# Patient Record
Sex: Male | Born: 1955 | Hispanic: Yes | Marital: Married | Smoking: Never smoker
Health system: Southern US, Community
[De-identification: ages and names within clinical notes are randomized; demographics above are authoritative.]

## PROBLEM LIST (undated history)

## (undated) DIAGNOSIS — G4730 Sleep apnea, unspecified: Secondary | ICD-10-CM

## (undated) DIAGNOSIS — E7800 Pure hypercholesterolemia, unspecified: Secondary | ICD-10-CM

## (undated) DIAGNOSIS — K219 Gastro-esophageal reflux disease without esophagitis: Secondary | ICD-10-CM

## (undated) DIAGNOSIS — I10 Essential (primary) hypertension: Secondary | ICD-10-CM

## (undated) DIAGNOSIS — I429 Cardiomyopathy, unspecified: Secondary | ICD-10-CM

## (undated) DIAGNOSIS — I2510 Atherosclerotic heart disease of native coronary artery without angina pectoris: Secondary | ICD-10-CM

## (undated) DIAGNOSIS — E785 Hyperlipidemia, unspecified: Secondary | ICD-10-CM

## (undated) DIAGNOSIS — M1990 Unspecified osteoarthritis, unspecified site: Secondary | ICD-10-CM

## (undated) DIAGNOSIS — I5022 Chronic systolic (congestive) heart failure: Secondary | ICD-10-CM

## (undated) DIAGNOSIS — T7840XA Allergy, unspecified, initial encounter: Secondary | ICD-10-CM

## (undated) DIAGNOSIS — D369 Benign neoplasm, unspecified site: Secondary | ICD-10-CM

## (undated) DIAGNOSIS — N529 Male erectile dysfunction, unspecified: Secondary | ICD-10-CM

## (undated) HISTORY — DX: Atherosclerotic heart disease of native coronary artery without angina pectoris: I25.10

## (undated) HISTORY — PX: CARDIAC CATHETERIZATION: SHX172

## (undated) HISTORY — DX: Male erectile dysfunction, unspecified: N52.9

## (undated) HISTORY — PX: SHOULDER SURGERY: SHX246

## (undated) HISTORY — DX: Benign neoplasm, unspecified site: D36.9

## (undated) HISTORY — DX: Allergy, unspecified, initial encounter: T78.40XA

## (undated) HISTORY — DX: Cardiomyopathy, unspecified: I42.9

## (undated) HISTORY — PX: KNEE ARTHROPLASTY: SHX992

## (undated) HISTORY — DX: Chronic systolic (congestive) heart failure: I50.22

## (undated) HISTORY — DX: Essential (primary) hypertension: I10

## (undated) HISTORY — PX: EYE SURGERY: SHX253

## (undated) HISTORY — DX: Hyperlipidemia, unspecified: E78.5

---

## 1997-11-15 ENCOUNTER — Other Ambulatory Visit: Admission: RE | Admit: 1997-11-15 | Discharge: 1997-11-15 | Payer: Self-pay | Admitting: Orthopedic Surgery

## 2000-08-09 ENCOUNTER — Ambulatory Visit (HOSPITAL_BASED_OUTPATIENT_CLINIC_OR_DEPARTMENT_OTHER): Admission: RE | Admit: 2000-08-09 | Discharge: 2000-08-09 | Payer: Self-pay | Admitting: Orthopedic Surgery

## 2002-06-17 ENCOUNTER — Encounter: Payer: Self-pay | Admitting: *Deleted

## 2002-06-17 ENCOUNTER — Inpatient Hospital Stay (HOSPITAL_COMMUNITY): Admission: EM | Admit: 2002-06-17 | Discharge: 2002-06-18 | Payer: Self-pay | Admitting: Emergency Medicine

## 2002-06-25 ENCOUNTER — Emergency Department (HOSPITAL_COMMUNITY): Admission: EM | Admit: 2002-06-25 | Discharge: 2002-06-25 | Payer: Self-pay | Admitting: Emergency Medicine

## 2004-06-15 ENCOUNTER — Inpatient Hospital Stay (HOSPITAL_BASED_OUTPATIENT_CLINIC_OR_DEPARTMENT_OTHER): Admission: RE | Admit: 2004-06-15 | Discharge: 2004-06-15 | Payer: Self-pay | Admitting: Interventional Cardiology

## 2004-06-15 ENCOUNTER — Inpatient Hospital Stay (HOSPITAL_COMMUNITY): Admission: AD | Admit: 2004-06-15 | Discharge: 2004-06-20 | Payer: Self-pay | Admitting: Interventional Cardiology

## 2004-07-03 ENCOUNTER — Encounter
Admission: RE | Admit: 2004-07-03 | Discharge: 2004-07-03 | Payer: Self-pay | Admitting: Thoracic Surgery (Cardiothoracic Vascular Surgery)

## 2004-09-13 ENCOUNTER — Encounter (HOSPITAL_COMMUNITY): Admission: RE | Admit: 2004-09-13 | Discharge: 2004-10-11 | Payer: Self-pay | Admitting: Interventional Cardiology

## 2006-04-16 HISTORY — PX: CORONARY ARTERY BYPASS GRAFT: SHX141

## 2006-11-20 ENCOUNTER — Encounter: Admission: RE | Admit: 2006-11-20 | Discharge: 2006-11-20 | Payer: Self-pay | Admitting: Interventional Cardiology

## 2006-11-26 ENCOUNTER — Inpatient Hospital Stay (HOSPITAL_BASED_OUTPATIENT_CLINIC_OR_DEPARTMENT_OTHER): Admission: RE | Admit: 2006-11-26 | Discharge: 2006-11-26 | Payer: Self-pay | Admitting: Interventional Cardiology

## 2006-12-02 ENCOUNTER — Ambulatory Visit (HOSPITAL_COMMUNITY): Admission: AD | Admit: 2006-12-02 | Discharge: 2006-12-03 | Payer: Self-pay | Admitting: Interventional Cardiology

## 2008-02-11 ENCOUNTER — Inpatient Hospital Stay (HOSPITAL_COMMUNITY): Admission: RE | Admit: 2008-02-11 | Discharge: 2008-02-13 | Payer: Self-pay | Admitting: Orthopedic Surgery

## 2008-08-28 ENCOUNTER — Emergency Department (HOSPITAL_BASED_OUTPATIENT_CLINIC_OR_DEPARTMENT_OTHER): Admission: EM | Admit: 2008-08-28 | Discharge: 2008-08-28 | Payer: Self-pay | Admitting: Emergency Medicine

## 2008-08-28 ENCOUNTER — Ambulatory Visit: Payer: Self-pay | Admitting: Diagnostic Radiology

## 2008-09-06 ENCOUNTER — Encounter: Admission: RE | Admit: 2008-09-06 | Discharge: 2008-09-06 | Payer: Self-pay | Admitting: Family Medicine

## 2010-08-29 NOTE — Cardiovascular Report (Signed)
NAMEANTWONE, CAPOZZOLI            ACCOUNT NO.:  [id number]   MEDICAL RECORD NO.:  [id number]          PATIENT TYPE:  OIB   LOCATION:  1963                         FACILITY:  [facility]   PHYSICIAN:  Lyn Records, M.D.   DATE OF BIRTH:  Jan 18, 1956   DATE OF PROCEDURE:  DATE OF DISCHARGE:  11/26/2006                            CARDIAC CATHETERIZATION   INDICATIONS FOR PROCEDURE:  Exertional chest discomfort in a patient who  is post-coronary artery bypass grafting in March 2006, at which time he  received a left internal mammary graft to the LAD, a free LIMA graft  sewn to the first obtuse marginal, off of the hood of a saphenous vein  graft to the second obtuse marginal.  He also had a saphenous vein graft  to the first diagonal.  For the past several weeks to a couple months,  he has had intermittent discomfort with variable threshold that causes  him to feel weak and funny in his chest.  He has not been taking  nitroglycerin for this.  The Cardiolite study recently demonstrated a  anterolateral wall defect and low-normal LV function.  There was not  significant re-distribution noted.   PROCEDURE PERFORMED:  1. Left heart cath.  2. Selective coronary angio.  3. Left ventriculography.  4. Bypass graft angiography.  5. Internal mammary artery graft angiography.   DESCRIPTION:  After informed consent the patient was brought to the  diagnostic outpatient laboratory, where he was sterilely prepped and  draped in the usual fashion.  She was given 2 mg of IV Versed.  Xylocaine 1% local infiltration was used.  A 4-French sheath was placed  in the right femoral artery.  Left heart catheterization was performed  with a 4-French A2 multipurpose catheter.  Left ventriculography by hand  injection, selective right native coronary angiography, and saphenous  vein graft angiography were performed with this catheter.  We then used  a #4 4-French left Judkins catheter for native left coronary  angiography.  We used an internal mammary artery catheter for a left  internal mammary graft angiography.  There was some initial confusion  about the site of the arterial free graft.  We were eventually able to  the learn from the operative report that the right internal mammary free  graft was sewn into the hood of the saphenous vein graft to the second  obtuse marginal.  Prior to understanding this, we did perform a flush  injection in the right subclavian artery and also an aortic root shot  looking for additional conduits that were not marked.  There were only  two aortic markers.   After reviewing the cineangiography and the prior coronary angiogram, as  well as the op note, the case was terminated and hemostasis achieved  with manual compression.   RESULTS:  1. Hemodynamic data:      a.     Aortic pressure 110/70.      b.     Left ventricular pressure 110/4.  2. Left ventriculography:  Low normal LV function, EF 50%.  3. Coronary angiography.      a.  Left main coronary:  Widely patent.      b.     The left anterior descending coronary:  Left anterior       descending coronary artery contains high-grade segmental 90 plus       percent stenosis before the first septal perforator and first       diagonal.  No competitive flow was noted in the diagonal.       Competitive flow was noted in the LAD beyond this region of       obstruction..      c.     Circumflex artery:  Circumflex gives origin to two small       obtuse marginal branches before it is totally occluded, after the       origin of the left atrial recurrent branch.      d.     Right coronary:  Right coronary is a large dominant vessel,       gives PDA and two left ventricular branches.  Minimal luminal       irregularities are noted.  4. Bypass graft angiography.      a.     Saphenous vein graft to the diagonal:  Totally occluded and       the aorto-ostial junction.      b.     Saphenous vein graft to the second  obtuse marginal:  This       graft is totally occluded at the site of the man-made bifurcation       with the right internal mammary graft to the first obtuse       marginal.      c.     The right internal mammary free graft sewn from the hood of       the saphenous vein graft to the obtuse marginal number 2 and       distally anastomosed to the first obtuse marginal:  This graft       conduit was widely patent.  5. Left internal mammary graft to the LAD:  This graft is widely      patent.  6. Aortography:  No evidence of aneurysm is noted.   CONCLUSIONS:  1. Low normal LV function.  2. Severe native vessel coronary disease with total occlusion of      distal circumflex, 90% stenosis in a small obtuse marginal branch      90% to 95% stenosis in the proximal LAD before the first septal      perforator and diagonal.  The LAD is protected by left internal      mammary graft to the LAD.  The saphenous vein graft to the diagonal      is occluded.  3. Bypass graft occlusive disease, with occlusion of saphenous vein      graft to the obtuse marginal #2, and occlusion of saphenous vein      graft to the first diagonal.  4. Patent LIMA to the LAD.  5. Patent free RIMA to the large obtuse marginal number 1.  6. Widely patent native right coronary.   RECOMMENDATIONS:  Consider PCI on the proximal LAD diagonal anastomosis,  to improve flow into this territory.  The second obtuse marginal  territory cannot be impacted by percutaneous intervention, because of  angulation and acute takeoff from the first obtuse marginal, making  retrograde work through the RIMA if possible, and total occlusion of the  native circulation making antegrade work down the native circulation  impossible.   PLAN:  Start Plavix.  Plan PCI on the LAD diagonal, when convenient for  the patient.      Lyn Records, M.D.  Electronically Signed     HWS/MEDQ  D:  11/26/2006  T:  11/27/2006  Job:  [id number]   cc:    Sigmund Hazel, M.D.  Salvatore Decent. Cornelius Moras, M.D.

## 2010-08-29 NOTE — Cardiovascular Report (Signed)
NAMESEAMUS, Scott Roth            ACCOUNT NO.:  [id number]   MEDICAL RECORD NO.:  [id number]          PATIENT TYPE:  OIB   LOCATION:  2908                         FACILITY:  [facility]   PHYSICIAN:  Lyn Records, M.D.   DATE OF BIRTH:  03-May-1955   DATE OF PROCEDURE:  12/02/2006  DATE OF DISCHARGE:                            CARDIAC CATHETERIZATION   PROCEDURE:  Cardiac catheterization and PCI.   INDICATIONS:  Recent chest discomfort, abnormal Cardiolite with fixed  anterolateral defect, and documentation of occluded saphenous vein graft  to the diagonal.  This diagonal territory was threatened because of high-  grade disease proximal to its origin and high-grade disease beyond its  origin from the LAD that obstructs retrograde flow from the patent LIMA  to the LAD.   PROCEDURE PERFORMED:  1. Drug-eluting stent implantation in the proximal LAD/first diagonal.  2. Intravascular ultrasound.   DESCRIPTION:  After informed consent, a 6-French sheath was placed in  the right femoral artery using the modified Seldinger technique.  A 6-  [place] XB 3.5 guide catheter was used for guide shot procurement.  The  patient received a bolus followed by an infusion of Angiomax.  He had  been previously started on Plavix and received an additional 300 mg  orally this morning.   An Clinical cytogeneticist guidewire was used across the stenosis.  We predilated  with a 2.5 x 12 Maverick.  We then deployed 16 x 2.75 Taxus and  postdilated the proximal two-thirds of the stent with a 12.0 x 3.25  Quantum to 14 atmospheres.  Deployment pressure for the stent was 10  atmospheres.  This distal stent margin was oversized for the diagonal  vessel.   After our post-dilatation with a 3.25 Quantum, we performed  intravascular ultrasound and documented good stent apposition and  expansion.   Following removal of the guidewire, we gave intracoronary nitroglycerin,  and after flushing the nitroglycerin, the patient  fibrillated due to R  on T during ST elevation from blebs placement during nitro  administration.  He was shocked back into normal sinus rhythm with 150  joules which initially failed and subsequently 200 joules.  The patient  awakened without any neurological sequelae.  Subsequent angiographic  runs demonstrated widely patent region of PCI.   Angio-Seal was used with good hemostasis.   RESULTS:  1. Hemodynamic data:  Aortic pressure 121/78.  2. PCI results:  LAD proximal to the first diagonal contains greater      than 90% stenosis.  Following PCI, as described above, 0% stenosis      was noted.  3. Ventricular fibrillation successfully treated with countershock.      It appears ventricular fibrillation was precipitated by saline      flush following intracoronary nitroglycerin administration at the      completion of the case.  4. Successful Angio-Seal.   IMPRESSION:  1. Successful percutaneous coronary intervention on the left anterior      descending reduces stenosis from 90% to 0% after drug-eluting Taxus      stent implantation.  2. Ventricular fibrillation treated with countershock without sequela.  This was felt to be a consequence of saline flush following      intracoronary nitroglycerin administration.   PLAN:  Discharge December 03, 2006.  Clinical followup as needed.      Lyn Records, M.D.  Electronically Signed     HWS/MEDQ  D:  12/02/2006  T:  12/02/2006  Job:  [id number]   cc:   Sigmund Hazel, M.D.

## 2010-08-29 NOTE — Op Note (Signed)
NAMENILSON, TABORA            ACCOUNT NO.:  [id number]   MEDICAL RECORD NO.:  [id number]          PATIENT TYPE:  INP   LOCATION:  5015                         FACILITY:  [facility]   PHYSICIAN:  Loreta Ave, M.D. DATE OF BIRTH:  1955/08/14   DATE OF PROCEDURE:  02/12/2008  DATE OF DISCHARGE:                               OPERATIVE REPORT   PREOPERATIVE DIAGNOSES:  Left knee, end-stage degenerative arthritis  with marked varus alignment and flexion contracture.  Bone loss, bone  insufficiency primarily proximal medial tibia.   POSTOPERATIVE DIAGNOSES:  Left knee, end-stage degenerative arthritis  with marked varus alignment and flexion contracture.  Bone loss, bone  insufficiency primarily proximal medial tibia.   PROCEDURE:  Left total knee replacement.  Modified minimally invasive  approach.  Stryker triathlon prosthesis.  Extensive soft tissue  balancing medial capsular and partial medial collateral ligament  release.  Reinforcement of insufficient medial tibial plateau with 2  inner fragmentary screws.  A 6.5 mm in the back and a 4.5 mm in the  front.  A cemented posterior stabilized femoral component size #4.  Cemented #5 tibial component with a 9-mm polyethylene insert.  This had  a 5-mm augmentation on the undersurface medially to make  up for bone  loss.  This was also stemmed with a 50-mm x 12-mm stem, cemented.  Resurfacing of patella with a 35-mm component.   SURGEON:  Loreta Ave, MD   ASSISTANT:  Genene Churn. Barry Dienes, [place], present throughout the entire case  necessary for timely completion of procedure.   ANESTHESIA:  General.   BLOOD LOSS:  Minimal.   TOURNIQUET TIME:  Two hours.   SPECIMENS:  None.   CONSULTS:  None.   COMPLICATIONS:  None.   DRESSING:  Soft compressive with knee immobilizer.   DRAIN:  Hemovac x1.   PROCEDURE:  The patient was brought to the operating room and placed on  the operating table in supine position.  After adequate  anesthesia had  been obtained, knee examined.  Almost 20 degrees of varus not very  correctable at all, about a 5-degree flexion contracture further flexion  about 80-85 degrees.  Tourniquet applied.  Prepped and draped in usual  sterile fashion.  Exsanguinated with elevation of Esmarch.  Tourniquet  inflated to 350 mmHg.  Straight incision above the patella down to the  tibial tubercle.  Medial arthrotomy vastus splitting at the top  preserving quad tendon.  Knee exposed.  Extensive medial capsule  release.  I actually released part of the MCL of the femoral and tibial  attachment maintaining medial stability, but enough soft tissue release  to get the knee corrected.  Distal femur exposed.  Intramedullary guide  placed.  Remnants of menisci, periarticular spurs, cruciate ligaments,  and loose bodies all removed.  A 10-mm distal cut on the femur.  Using  epicondylar axis, size cut and fitted for a posterior stabilized #4  component.  Although some bony deficiency medially,  I could go over the  standard component.  I then used an extramedullary 0-degree cut on the  tibia.  This was cut sequentially  till I got down to the level of the  fibular head laterally.  This still left the defect medially.  Utilizing  appropriate jigs, a 5-mm offset curve was made on the medial side, so I  can get below the defect there.  When this was complete, the medial  wall, which is markedly deficient was mobile near the top and I was very  concerned about a fracture extending if we did not stabilize that.  Putting the temporary trial in place in the tibia which had been precut  using the #5 component after rotation was set with trials with the  medial 5-mm buildup and with the stem, I then reinforced medial wall  with 2 interfragmentary screws.  A 6.5-mm lag screw behind the tibial  component and a 4.5-mm in front of the tibial component.  This gave good  solid reinforced on the medial wall.  Patella exposed.   A posterior 10  mm removed.  Size drilled, cut, and fitted for a 35-mm component.  When  that was all complete, trials were put back in place.  I was very  pleased with flexion, extension, alignment, stability and patellofemoral  tracking.  All trials were removed.  Copious irrigation with a pulse  irrigating device.  Before completion, we had deflated the tourniquet,  irrigated the knee once again and then reinflated it when we cemented  the components.  All components firmly cemented.  Polyethylene attached  to tibia and knee reduced.  At completion, I had nice alignment with a  normal mechanical axis.  Full extension, full flexion, good  patellofemoral tracking, and good stability.  The wound irrigated.  Arthrotomy closed with #1 Vicryl.  Skin and subcutaneous tissue with  Vicryl and staples.  Knee injected with Marcaine after Hemovac was  placed and then the Hemovac clamped.  Sterile compressive dressing  applied.  Tourniquet was inflated and removed.  Knee immobilizer  applied.  Anesthesia reversed.  Brought to recovery room.  Tolerated  surgery well.  No complications.      Loreta Ave, M.D.  Electronically Signed     DFM/MEDQ  D:  02/12/2008  T:  02/13/2008  Job:  [id number]

## 2010-09-01 NOTE — Op Note (Signed)
NAMEUPTON, RUSSEY            ACCOUNT NO.:  [id number]   MEDICAL RECORD NO.:  [id number]          PATIENT TYPE:  INP   LOCATION:  2307                         FACILITY:  [facility]   PHYSICIAN:  Burna Forts, M.D.DATE OF BIRTH:  04/21/1955   DATE OF PROCEDURE:  06/16/2004  DATE OF DISCHARGE:                                 OPERATIVE REPORT   PROCEDURE:  Intraoperative transesophageal echocardiogram.   ANESTHESIOLOGIST:  Burna Forts, M.D.   INDICATIONS FOR PROCEDURE:  Mr. Baldree is a patient of Dr. Erskine Emery Smith's  who presents today for coronary artery bypass grafting by Dr. Tressie Stalker.  We plan to place the TEE probe for evaluation of cardiac structures and  function as requested by Dr. Cornelius Moras.  The patient was brought to the holding  area where under local anesthesia and sedation the patient's radial artery  and coronary artery catheters were inserted.  The patient was taken to the  operating room for routine induction of general anesthesia.  The trachea was  intubated.   The TEE probe was then lubricated and passed oropharyngeally into the  stomach.  It was withdrawn slightly for imaging of the cardiac structures.   Pre-cardiopulmonary Bypass TEE Examination:  Left Ventricle:  The is an essentially normal left ventricular chamber size  with normal wall thickness in short axis views, and seen well.  There is  satisfactory overall contractility with some slight diminishment of overall  left ventricular function noted.  There are essentially no areas of  significant hypokinesis, and the short axis view and long axis view again  reveal essentially some mild anterior hypokinesis in the wall on just long  axis view.  The apical area was seen and satisfactory contractility was  noted in the apical ventricular chamber view.  Capillary muscles are well  outlined.  This is a normal chamber size as noted.   Mitral Valve:  This is a thin compliant mobile mitral valve  apparatus  opening appropriately and closing satisfactorily.  There is trace mitral  regurgitant flow noted on Doppler examination.   Left Atrium:  Normal left atrial chamber.  The anterior atrial septum is  interrogated and it is intact.   Aortic Valve:  The aortic valve is essentially normal.  Three cusps to the  aortic valve.  All cusps were mobile, compliant and functioning in the  normal manner.  There is trace aortic regurgitant flow noted in a  very  central area of the three cusps; again, this is trace.   Right Ventricle:  This is a somewhat enlarged right ventricular chamber,  very well trabeculated noted.  The overall chamber size appears to be just  slightly larger than normal; and, as we go through a progression of views  there does appear slightly increased size in the pulmonary outflow tract as  well as the pulmonary artery itself.   Tricuspid Valve:  Essentially normal tricuspid valve, again trace tricuspid  regurgitant flow noted on Doppler examination.   Right Atrium:  Normal right atrial chamber.   The patient was placed on cardiopulmonary bypass, hypothermia begun and  coronary artery bypass  grafting was carried out.  The patient was rewarmed  and separated from cardiopulmonary bypass with the initial attempt.   Post Cardiopulmonary Bypass Examination ((Limited Examination):  Left Ventricle:  In the early bypass period the left ventricular chamber  appeared just slightly diminished in its contractual pattern.  This was  associated also with an additional repair of a vein graft to the diagonal by  Dr. Cornelius Moras.  During that period of time there may have been some slight  diminishment and further increase in the hypokinetic area in the anterior to  anterolateral wall area.  This was noted in both short axis and long axis  views of the left ventricular chamber.  Overall chamber size remained about  the same.  Overall contractility was essentially no different from the   prebypass period.  With time from separation from cardiopulmonary bypass the  left ventricular contractility pattern did appear to improve prior to  returning to the cardiac intensive care unit.   Mitral Valve:  Four-chamber view of the mitral valve revealed perhaps an  increase in the mitral regurgitant flow to, you know, just mitral  regurgitation than trace, but certainly no more than 1+ mitral regurgitant  jet noted in the postbypass period.   The rest of the cardiac examination was as previously described.   The patient was returned to the cardiac intensive care unit in stable  condition.      JTM/MEDQ  D:  06/16/2004  T:  06/17/2004  Job:  [id number]   cc:   Anesthesiology Department

## 2010-09-01 NOTE — Discharge Summary (Signed)
Scott Roth, Scott Roth            ACCOUNT NO.:  [id number]   MEDICAL RECORD NO.:  [id number]          PATIENT TYPE:  INP   LOCATION:  2002                         FACILITY:  [facility]   PHYSICIAN:  Salvatore Decent. Cornelius Moras, M.D. DATE OF BIRTH:  1955/10/13   DATE OF ADMISSION:  06/15/2004  DATE OF DISCHARGE:                                 DISCHARGE SUMMARY   PRIMARY DIAGNOSIS:  Coronary artery disease.   SECONDARY DIAGNOSES:  1.  Hypertension.  2.  Hyperlipidemia.  3.  Gastroesophageal reflux disease.   OPERATION/PROCEDURE:  In hospital operations and/or procedures:  1.  Cardiac catheterization.  2.  Coronary artery bypass grafting times four with the left internal      mammary artery to the left anterior descending, a free right internal      mammary artery to the first branch of the obtuse marginal, saphenous      vein graft to the first branch of the diagonal and a saphenous vein      graft to the second branch obtuse marginal.  3.  Right endovascular vein harvesting was used.   ALLERGIES:  The patient is allergic to PENICILLIN and DYE.   HISTORY, PHYSICAL AND HOSPITAL COURSE:  Scott Roth is a 55 year old male  from [place], [place]  with a known history of coronary artery  disease.  The patient also has a past medical history of hypertension,  hyperlipidemia and gastroesophageal reflux disease.  The patient originally  was evaluated by Dr. Katrinka Blazing in March 2004.  At that time he had some mild  symptoms of chest pain and he underwent  stress test and cardiac  catheterization.  He was found to have two-vessel coronary artery disease,  which at that time was not high grade.  The patient was treated medically  and had done well until recently.   Scott Roth now describes a two-month history of progressive symptoms as  classical angina.  He describes squeezing substernal chest pain that  radiates across his left chest and up to the neck and his arm.  This, for  the most  part, has appeared with physical activity and is also relieved with  rest.  These symptoms have increased in frequency and severity in recent  weeks; and, recently he also had an episode of chest pain and shortness of  breath that awoke him from sleep at night.  He returned to Dr. Katrinka Blazing and  subsequently was scheduled for cardiac catheterization that was performed on  June 15, 2004.  Catheterization demonstrated progression of severe two-  vessel coronary artery disease with coronary anatomy relatively unfavorable  for percutaneous coronary intervention.  Left ventricular function was  preserved.  We were then consulted at that time.   The patient's hospital course:  On June 16, 2004 Scott Roth underwent  coronary artery bypass grafting times four with a left internal mammary  artery to the left anterior descending, a free right internal mammary artery  to the first branch of the obtuse marginal, saphenous vein graft to the  first branch of the diagonal and saphenous vein graft to the second branch  of  the obtuse marginal.  Of note, right endovascular harvesting was used.  The patient tolerated this procedure well and was transferred up to the  intensive care unit in stable condition.   The patient's postoperative course was pretty much unremarkable.  He was  extubated the evening of surgery.  Chest tubes were DC 'd on postoperative  day one.  The patient was transferred out to the telemetry floor on  postoperative day two.  The patient did have some postoperative edema of the  bilateral lower extremities and he was placed on diuretics, which will be  continued at discharged.  During his hospital stay he was able to come off  the nasal cannula oxygen and on room air his O2 sats at discharge were  ranging from 94-96%.  The patient was ambulating well.  Appetite was  improving and he had a positive bowel movement postoperatively.   At discharge his incisions seemed to be dry, intact  and healing well.   On postoperative day three pacing wires were DC 'd; and, as well he was his  on cue for pain medication.  The patient was discharged to home on  postoperative day number four in stable condition.   FOLLOW UP:  A follow up appointment is to be made with Dr. Cornelius Moras in three  weeks as well as with Dr. Katrinka Blazing in two weeks.  The patient will need to  obtain a PA and lateral chest x-ray at [facility] one hour prior to  his appointment with Dr. Cornelius Moras.   DISCHARGE INSTRUCTIONS:  Scott Roth received instructions on diet,  activity level and incisional care.  He was told he was restricted from  driving and was told at least to do so.  No heavy lifting over 10 pounds and  he is to wash his incisions with soap and water.  He was also told to  contact the office if he develops any drainage opening from any of his  incisions.  The patient, again, acknowledges understanding.  He is to  continue ambulating as tolerated and is to use his incentive spirometer  daily.   DISCHARGE MEDICATIONS:  1.  Aspirin 325 mg p.o. daily.  2.  Toprol XL 50 mg p.o. daily.  3.  Lipitor 20 mg p.o. q.h.s.  4.  Lasix 40 mg p.o. daily times 14 days.  5.  Potassium chloride 20 mEq p.o. daily times 14 days.  6.  Tylox 1 tab p.o. q.4 hours p.r.n. pain.  7.  Currently we will hold starting an ACE due to control of the patient's      blood pressure and heart rate.      KMD/MEDQ  D:  06/19/2004  T:  06/20/2004  Job:  [id number]

## 2010-09-01 NOTE — Cardiovascular Report (Signed)
NAMEVALLEN, CALABRESE            ACCOUNT NO.:  [id number]   MEDICAL RECORD NO.:  [id number]          PATIENT TYPE:  OIB   LOCATION:  6501                         FACILITY:  [facility]   PHYSICIAN:  Lyn Records III, M.D.DATE OF BIRTH:  Aug 06, 1955   DATE OF PROCEDURE:  06/15/2004  DATE OF DISCHARGE:                              CARDIAC CATHETERIZATION   INDICATIONS:  The patient is 38 and has a history of moderate coronary  disease documented by coronary angiography in March of 2004. Within the past  six weeks, he has developed predictable exertional angina which is a change  in symptomatology. The study is being done to redefine coronary anatomy and  help guide therapy.   PROCEDURE PERFORMED:  1.  Left heart cath.  2.  Selective coronary angio.  3.  Left ventriculography.   DESCRIPTION:  After informed consent, a 4-French sheath was placed in the  right femoral artery using the modified Seldinger technique. A 4-French A2  multipurpose catheter was used for hemodynamic recordings, left  ventriculography by hand injection, and selective right coronary. The left  coronary was selectively engaged with a #4, 4-French left Judkins catheter.  The patient tolerated the procedure without complications. 200 mcg of  intracoronary nitroglycerin was given into the right coronary during the  right coronary angiography.   RESULTS:  A.  Left main coronary:  Widely patent. It branches into the LAD  and circumflex.  B.  Left anterior descending coronary:  The left anterior descending is not  transapical. It supplies the anterior wall. It gives origin to the two  diagonals. The first diagonal contains 90% mid narrowing. The diagonal is  relatively small. The LAD contains segmental 80-90% stenosis and also  involves the origin of a large branching second diagonal. The second  diagonal has ostial 70-80% narrowing. The vessel is otherwise free of  obstruction. The diagonal is bifurcating.  C.   Circumflex coronary:  The circumflex coronary artery is a large vessel  that gives origin to four obtuse marginal branches. The first obtuse  marginal was tiny, the second obtuse marginal is slightly larger but also  small and not graftable. The third obtuse marginal is a large vessel that  supplies the lateral wall and apex. The fourth obtuse marginal supplies the  inferolateral wall and was also large. The patient has a severe 99% mid  circumflex stenosis that involves the origin of all three of the more distal  obtuse marginal branches, 2-4.  The stenosis is subtotal 99% with TIMI grade  2 to TIMI grade 3 flow.  D.  Right coronary:  The right coronary is a large dominant vessel with only  minimal plaquing. A large dominant PDA that is transapical arises as well as  two left ventricular branches.   CONCLUSION:  1.  The patient has severe complex left anterior descending and circumflex      disease involving multiple branches with the stenoses as outlined above.      The right coronary is dominant and free of any significant obstruction.  2.  Normal left ventricular function.   RECOMMENDATIONS:  Despite the  patient's young age given the anatomy, I feel  that surgical revascularization is probably the most prudent approach.  Percutaneous intervention is possible but there is a risk of side branch  occlusion and myocardial injury. If the patient decides against surgery, I  would  certainly be happy to attempt to  perform PCI initially on the circumflex and subsequently on the left  anterior descending probably as  separate sitting/stage. I will admit the  patient to the hospital, start heparin and Integrilin and have CVTS see the  patient and hopefully have surgery done with the next 24 hours.      HWS/MEDQ  D:  06/15/2004  T:  06/15/2004  Job:  [id number]   cc:   Seymour Bars, D.O.  [facility].  [facility]. Resident  [place], [place] [address]  Fax: [phone]   [name]   [facility]

## 2010-09-01 NOTE — Op Note (Signed)
Scott Roth, Scott Roth            ACCOUNT NO.:  [id number]   MEDICAL RECORD NO.:  [id number]          PATIENT TYPE:  INP   LOCATION:  2307                         FACILITY:  [facility]   PHYSICIAN:  Salvatore Decent. Cornelius Moras, M.D. DATE OF BIRTH:  12-Aug-1955   DATE OF PROCEDURE:  06/16/2004  DATE OF DISCHARGE:                                 OPERATIVE REPORT   PREOPERATIVE DIAGNOSIS:  Severe two-vessel coronary artery disease with  class II angina.   POSTOPERATIVE DIAGNOSIS:  Severe two-vessel coronary artery disease with  class II angina.   PROCEDURE:  Median sternotomy for coronary artery bypass grafting x4 (left  internal mammary artery to distal left anterior descending coronary artery,  free right internal mammary artery to first circumflex marginal branch,  saphenous vein graft to first diagonal branch, saphenous vein graft to  second circumflex marginal branch, endoscopic saphenous vein harvest from  right thigh).   SURGEON:  Salvatore Decent. Cornelius Moras, M.D.   ASSISTANT:  Ms. Zadie Rhine.   ANESTHESIA:  General.   BRIEF CLINICAL NOTE:  The patient is a 55 year old male with known history  of coronary artery disease, hypertension, hyperlipidemia, and GE reflux  disease.  The patient now presents with recent onset of exertional angina.  The patient underwent cardiac catheterization by Dr. Katrinka Blazing, demonstrating  severe two-vessel coronary artery disease with coronary anatomy relatively  unfavorable for percutaneous coronary intervention.  A full consultation  note has been dictated previously.   OPERATIVE CONSENT:  The patient has been counseled at length regarding the  indications, risks, and potential benefits of coronary artery bypass  grafting.  He understands and accepts all associated risks of surgery and  desires to proceed as described.   OPERATIVE NOTE IN DETAIL:  The patient is brought to the operating room on  the above-mentioned date and central monitoring is established by  the  anesthesia service under the care and direction of Dr. Sharee Holster.  Specifically, a Swan-Ganz catheter is placed through the right internal  jugular approach.  A radial arterial line is placed.  Intravenous  antibiotics are administered.  Following induction with general endotracheal  anesthesia, a Foley catheter is placed.  The patient's chest, abdomen, both  groins, and both lower extremities are prepared and draped in sterile  manner.   A median sternotomy incision is performed and the left internal mammary  artery is dissected from the chest wall and prepared for bypass grafting.  The left internal mammary artery is a good-quality conduit.  Following this,  the right internal mammary artery is also dissected from the chest wall and  prepared for bypass grafting.  This vessel is also a good-quality conduit.  Simultaneously, a saphenous vein is obtained from the patient's right thigh  using endoscopic vein harvest technique.  The saphenous vein is a good-  quality conduit.  The patient is heparinized systemically.  After the  saphenous vein is removed, the small surgical incision in the right thigh is  closed in multiple layers with running absorbable suture.   The pericardium is opened.  The ascending aorta is normal in appearance.  The ascending aorta  and the right atrium are cannulated for cardiopulmonary  bypass.  Adequate heparinization is verified.  Cardiopulmonary bypass is  begun and the surface of the heart is inspected. Overall, the heart is  somewhat enlarged with generalized biventricular enlargement.  However, left  ventricular function appears normal.  Distal sites are selected for coronary  bypass grafting.  Of note, the right internal mammary artery is not long  enough to reach to any of the distal targets as an in situ graft.  Subsequently, the right internal mammary artery is transected just beyond  its takeoff from the right subclavian artery and the  proximal stump is  oversewn with suture ligature.  A cardioplegia catheter is placed in the  ascending aorta.  A temperature probe is placed in the left ventricular  septum.   The patient's temperature is allowed to cool passively to 32 degrees  systemic temperature.  The aortic crossclamp is applied and cardioplegia is  delivered initially in an antegrade fashion through the aortic root.  Iced  saline slush is applied for topical hypothermia.  The initial cardioplegic  arrest and myocardial cooling are felt to be excellent.  Repeat doses of  cardioplegia are administered intermittently throughout the crossclamp  portion of the operation, both through the aortic root and down the  subsequently placed vein grafts to maintain septal temperature to below 15  degrees centigrade.   The following distal coronary anastomoses are performed:  1.  The first circumflex marginal branch is grafted with the right internal      mammary artery as a free graft in an end-to-side fashion.  This coronary      measures 1.8 mm in diameter at the site of distal bypass and is of good      quality.  2.  The second circumflex marginal branch is grafted with a saphenous vein      graft in an end-to-side fashion.  This coronary measures 1.5 mm in      diameter and is of good quality.  3.  The diagonal branch off the left anterior descending coronary artery is      grafted with a saphenous vein graft in an end-to-side fashion.  This      diagonal branch is actually at a bifurcating system and the medial sub-      branch is grafted.  It is the larger and more dominant of the to sub-      branches.  At the site of distal bypass, it measures 1.5 mm in diameter      and is of good quality.  4.  The left anterior descending coronary artery is grafted with left      internal mammary artery in an end-to-side fashion.  This coronary artery      is intramyocardial.  At the site of distal bypass, it measures 2 mm in      diameter and is of good quality.   Both proximal saphenous vein anastomoses are performed directly to the  ascending aorta prior to removal of the aortic crossclamp.  The left  ventricular septal temperature rises rapidly with reperfusion of the left  internal mammary artery.  The aortic crossclamp is removed after a total  crossclamp time of 67 minutes.   The heart begins to beat spontaneously without need for cardioversion.  All  proximal and distal coronary anastomoses were inspected for hemostasis and  appropriate graft orientation.  The proximal end of the right internal  mammary artery graft is sewn  in a side-to-end fashion onto the hood of the  vein graft to the second circumflex marginal branch.  The patient is  rewarmed to 37 degrees centigrade temperature.  The patient weaned from  cardiopulmonary bypass without difficulty.  The patient's rhythm at  separation from bypass is normal sinus rhythm.  No inotropic support is  required.  Total cardiopulmonary bypass time for the operation 101 minutes.   After separation from bypass, it appears that the vein graft to the diagonal  branch appears to be slightly short and under a little bit of tension.  For  this reason, a short interposition of saphenous vein is sewn end-to-end  within the vein conduit to lengthen this graft slightly.  After this has  been completed, all grafts are again reinspected and appear to be  functioning appropriately.  The venous and arterial cannulae are removed  uneventfully.  Protamine is administered to reverse the anticoagulation.  The mediastinum and both pleural spaces are irrigated with saline solution  containing vancomycin.  Meticulous surgical hemostasis is ascertained.   The mediastinum and both pleural spaces were drained with 4 chest tubes  exited through separate stab incisions inferiorly.  On-Q catheters are  placed in the deep subcutaneous tissues immediately anterior to the costal   cartilages on either side of the sternal border for continuous infusion of  bupivacaine for postoperative analgesia management.  Each of these catheters  are bolused with 5 mL of 0.5% plain bupivacaine solution and subsequently  connected to the continuous-infusion pump provided by the manufacturer.  The  median sternotomy is then closed with double-strength sternal wire.  The  soft tissues anterior to the sternum are closed in multiple layers and the  skin is closed with a running subcuticular skin closure.   The patient tolerated the procedure well and is transported to the surgical  intensive care unit in stable condition.  There are no intraoperative  complications.  All sponge, instrument and needle counts are verified  correct at completion of the operation.  No blood products were  administered.      CHO/MEDQ  D:  06/16/2004  T:  06/17/2004  Job:  [id number]   cc:   Lesleigh Noe, M.D.  [address]  [address]  [place] [address]  Fax: [phone]   Adrian Saran, [place].P.

## 2010-09-01 NOTE — Discharge Summary (Signed)
NAME:  Scott Roth, Scott Roth                      ACCOUNT NO.:  [id number]   MEDICAL RECORD NO.:  [id number]                   PATIENT TYPE:  INP   LOCATION:  3714                                 FACILITY:  [facility]   PHYSICIAN:  Lyn Records, M.D.                DATE OF BIRTH:  October 24, 1955   DATE OF ADMISSION:  06/17/2002  DATE OF DISCHARGE:  06/18/2002                                 DISCHARGE SUMMARY   PROCEDURES:  1. June 18, 2002, diagnostic cardiac catheterization, Dr. Verdis Prime.   DISCHARGE DIAGNOSES:  1. Symptoms of unstable angina; question acute coronary syndrome:     a. June 18, 2002, diagnostic cardiac catheterization revealing moderate        left anterior descending, circumflex disease with 65 to 75% left        anterior descending at bifurcation of diagonal 1.  Diagonal with 70%        stenosis; 60 to 75% distal circumflex beyond obtuse marginal #2.     b. Normal left ventricular function, ejection fraction of approximately        60%.     c. Negative serial CK-MB with slightly elevated troponin-I of 0.15 and        0.14.     d. Medical therapy of moderate left anterior descending and circumflex        disease with beta blocker, antiplatelet therapy and PRN sublingual        nitroglycerin.  Outpatient stress Cardiolite to establish baseline.  2. Hypertension;  blood pressure ranging from 150's to 160's/90's to 100's.     On hydrochlorothiazide 12.5 mg p.o. daily, beta blocker, low dose, added     to regimen.  3. History of dyslipidemia; lipid profile pending at time of this discharge.     He will continue on his Pravachol dose as prior to admission (? 40 mg per     day).   PLAN:  1. Discharge home with condition stable, improved.  2. Follow up outpatient Cardiolite on July 01, 2002 to establish baseline.   MEDICATIONS:  1. (New) nitroglycerin tablet 0.4 mg sublingual PRN chest pain, up to three     tablets in 15 minutes.  Patient is told to lay down when taking  this     medication.  2. Pravachol, dose as before (? 40 mg) p.o. q.h.s.  3. (Increased) enteric coated aspirin 325 mg once daily.  4. (New) Toprol XL 25 mg p.o. daily.  5. Hydrochlorothiazide one-half of a 25 mg tablet p.o. daily, eat a banana     and/or drink a glass of orange juice every day when taking this     medication as it can lower potassium level.   ACTIVITY:  No heavy lifting or pushing or driving the day of discharge and  day post discharge then activity as before.   DIET:  Low fat, low cholesterol, no added salt.  WOUND CARE:  The patient is to call our office if he notices any swelling,  bruising in groin area.   FOLLOW UP:  Follow up outpatient stress Cardiolite Wednesday, July 01, 2002  at 10 AM.  Patient is instructed to be NPO the evening before test; hold  Toprol the day before and day of test.  No caffeine day prior to and day of  test.   HISTORY OF PRESENT ILLNESS:  The patient is a 55 year old male (family from  [place]) without prior cardiac history.  He was diagnosed with elevated blood  pressure approximately six to eight months earlier.  He felt fine so he did  not take his medications.  The patient had been complaining of increasing  episodes of headaches.  His blood pressure revealed systolic 140's/diastolic  110's.  He was started on hydrochlorothiazide and took his first dose  Monday, June 15, 2002 with subsequently feeling dizzy, weak and washed out.  No syncope.  He had associated right arm numbness and a sinking sensation  in left chest.  No shortness of breath, nausea nor palpitations.  No  exertional chest pain recently.  No recurrent symptoms.  He went to see  Nurse Practitioner, [name].  She noted electrocardiogram changes from  previous with nonspecific ST wave changes I, aVL, II, III,  aVF and V5  through V6.  The patient was seen by Dr. Katrinka Blazing at [facility] emergency room.  Serial CK-MB's were negative but first  troponin-I  elevated at 0.15, second troponin-I was 0.14.  The patient was counseled to  undergo cardiac catheterization which he accepted.  Results as above.  His  cardiac catheterization revealed essentially moderate left anterior  descending and circumflex disease.  Plans for medical therapy with follow up  outpatient stress Cardiolite to establish baseline.  He was discharged on  low dose Toprol, continuation of hydrochlorothiazide, continuation of  Pravachol, aspirin, and PRN sublingual nitrates.   LABORATORY DATA:  White blood cell count 7.1, hemoglobin 16.9, hematocrit  47.6, platelet count 309,000.  Differential within normal range essentially.  ProTime 1.9, INR 0.8, PTT 33. Sodium 137, potassium 3.9, chloride 103, cO2  27, glucose 90, BUN 16, creatinine 0.9.  Liver function tests all within  normal range.  First CK 121, MB fraction 1.5, troponin-I 0.15, second CK 95,  MB fraction 1.5, third CK 85, MB fraction 1, troponin-I 0.14.  Chest x-ray  revealed probable tiny granuloma within the right upper lobe.  No active  chest disease.  Electrocardiogram revealed NSR with nonspecific T wave  changes, inferior lateral leads.  This is changed compared to EKG August 06, 2000 at which time lateral T wave changes were really not present.     Salomon Fick, N.P.                       Lyn Records, M.D.    MES/MEDQ  D:  06/18/2002  T:  06/19/2002  Job:  [id number]

## 2010-09-01 NOTE — H&P (Signed)
NAME:  Scott Roth, Scott Roth                      ACCOUNT NO.:  [id number]   MEDICAL RECORD NO.:  [id number]                   PATIENT TYPE:  INP   LOCATION:  3714                                 FACILITY:  [facility]   PHYSICIAN:  Lyn Records, M.D.                DATE OF BIRTH:  [birth year]   DATE OF ADMISSION:  06/17/2002  DATE OF DISCHARGE:                                HISTORY & PHYSICAL   ADMISSION DIAGNOSES:  1. Abnormal electrocardiogram.  2. Acute coronary syndrome (?).  3. Hypertension.  4. Accelerated.  5. Hyperlipidemia.   CHIEF COMPLAINT:  Abnormal electrocardiogram.   HISTORY OF PRESENT ILLNESS:  This is a 55 year old male (family from [place])  without prior cardiac history.  Diagnosed with elevated blood pressure  approximately six to eight months ago.  He felt fine, so he did not take his  medicines.  Recently he had been complaining of increasing episodes of  headaches.  The blood pressure check revealed systolic in the 140s and  diastolic in the 110s.  Hydrochlorothiazide was recommended.  He took his  first dose (25 mg) on Monday, June 15, 2002.  He felt dizzy, weak, and  washed out.  No syncope.  He had associated right arm numbness and a  sinking sensation in the left chest.  No shortness of breath, nausea, or  palpitations.  No exertional chest pain recently.  No recurrent symptoms.  He went to see his nurse practitioner, [name], N.P., today.  The EKG  was changed from prior.  This showed nonspecific ST-T wave changes in 1,  aVL, 2, 3, aVF, and V5-6.  Based on recent symptoms, risk factors, and  abnormal EKG, the patient was sent to the ER for further evaluation by Lyn Records, M.D.   ALLERGIES:  PENICILLIN causing rash.   MEDICATIONS:  1. Hydrochlorothiazide 25 mg one-half tablet a day.  2. Pravachol, dose unknown.  3. Aspirin 81 mg.  4. Garlic tablet.   PAST MEDICAL HISTORY:  1. Hypertension.  2. Hyperlipidemia (profile in September of 2003  showing total cholesterol of     221, triglycerides 213, HDL 46, and LDL 158).  3. Left knee arthroscopy in 2002.  4. GERD.   SOCIAL HISTORY:  The patient is separated.  Father of two.  He was an  Market researcher.  He is currently in sales and travels frequently.  No regular exercise currently.  Drinks maybe two beers a week.  No alcohol.  No illicit drug use.   FAMILY HISTORY:  Father alive at age 28.  He had bypass at age 57.  Mother  age 48 with hypertension.  Five siblings, two with hypertension.  No  premature family history of heart disease.   REVIEW OF SYSTEMS:  Increased headaches recently with elevated blood  pressure.  No fever, chills, cough, or cold symptoms.  No congestion.  He  does have  indigestion after eating acidic foods.  No exertional indigestion.  No peptic ulcer disease or melena.  No claudication or lower extremity  edema.   PHYSICAL EXAMINATION:  VITAL SIGNS:  The blood pressure is 164/104, pulse  64, respirations 20, and SAO2 94% on room air.  GENERAL APPEARANCE:  The patient is alert and oriented x 3 and in no acute  distress.  He is very fit.  HEENT:  Normocephalic and atraumatic.  NECK:  Supple without bruits or masses.  LUNGS:  Clear to auscultation.  No wheezes.  CORONARY:  Regular rate and rhythm without murmur, rub, or gallop.  ABDOMEN:  Soft, nontender, and nondistended.  Normoactive bowel sounds in  four quadrants.  EXTREMITIES:  2+ arm pulses bilaterally without bruits.  2+ distal pulses  without edema.  NEUROLOGIC:  Nonfocal.  Mentation intact.   LABORATORY DATA:  The EKG shows normal sinus rhythm with nonspecific T-wave  changes in the inferolateral leads.  This is changed from EKG on August 06, 2000, at which time the lateral T-wave changes were really not present.  The  chest x-ray results are pending.   INR 0.8.  Hemoglobin 16.9, platelets 309, WBC 7.1.  Potassium 3.9, BUN 16,  creatinine 0.9, glucose 90.  CK 121, MB 1.5,  troponin I 0.15.   IMPRESSION:  1. Acute coronary artery syndrome (questionable) with abnormal     electrocardiogram.  2. Hypertension, accelerated.  3. Hyperlipidemia.   PLAN:  1. Will admit.  2. Lovenox.  3. Sublingual nitroglycerin p.r.n.  4. Beta blocker therapy.  5. Aspirin.  6. Heart catheterization on June 18, 2002 by Lyn Records, M.D.   The risks and benefits of the procedure have been reviewed with the patient.  Lyn Records, M.D., will see the patient this afternoon.     Georgiann Cocker Jernejcic, P.A.                   Lyn Records, M.D.    TCJ/MEDQ  D:  06/17/2002  T:  06/17/2002  Job:  [id number]

## 2010-09-01 NOTE — Op Note (Signed)
[place]  Patient:    Scott Roth, Scott Roth                   MRN: [id number] Proc. Date: 08/09/00 Adm. Date:  [id number] Attending:  Milly Jakob                           Operative Report  PREOPERATIVE DIAGNOSIS:  Medial meniscal tear.  POSTOPERATIVE DIAGNOSES: 1. Medial meniscal tear. 2. Chondromalacia medial femoral condyle. 3. Chondromalacia with osteochondral defect patella.  PRINCIPLE PROCEDURE: 1. Partial posterior horn medial meniscectomy with debridement of medial    femoral condyle. 2. Debridement of patellofemoral osteochondral defect.  SURGEON:  Harvie Junior, M.D.  ASSISTANT:  Currie Paris. Thedore Mins.  ANESTHESIA:  Block.  BRIEF HISTORY:  A 55 year old male with a long history of having had severe knee problems on the left side.  He ultimately most recently began having catching and locking on the right side and when evaluated in the office after a couple of months of pain.  He was noted at that time to have a palpable clunk on the back side of his knee and we had a long talk about treatment options and ultimately he felt he wanted to come to the operating room for evaluation of anesthesia arthroscopy and he is brought to the operating room for that procedure.  PROCEDURE:  The patient was brought to the operating room and after general anesthesia was obtained with general anesthetic the patient was placed on the operating room table and examined under anesthesia and was noted to be stable in all directions.  At that point the patient was prepped and draped in the usual sterile fashion and following this routine arthroscopic examination of the knee revealed there was an obvious posterior horn medial meniscal tear. This was debrided with a combination of straight biting forceps and right-hand side biting forceps, and upbiting forceps.  The remaining meniscal remnants were contoured down with a suction shaver.  The medial  femoral condyle was identified and was noted to have some significant areas of chondromalacia which were debrided back to a smooth and stable rim.  Attention was then turned to the anterior cruciate which was normal, attention was turned laterally where there was no evidence of abnormality.  Attention was turned back up to the patellofemoral joint where the tracking was noted to be midline but there was a fairly significant osteochondral defect on the posterior aspect of the patella.  This was debrided with a suction shaver down to a stable rim.  The knee was then put through a range of motion, and the trochlea did not appear to have significant chondromalacia. At this point the knee was copiously irrigated and suctioned dry.  A final check had been made prior to this to make sure there was no lose fragment or pieces on the medial side.  In fact, there was none.  At this point the sterile compressive dressing was applied along with a dressing.  The patient was taken to the recovery room and found to be in satisfactory condition.  ESTIMATED BLOOD LOSS DURING THE PROCEDURE:  None. DD:  08/09/00 TD:  08/10/00 Job: [id number]

## 2010-09-01 NOTE — H&P (Signed)
Scott Roth            ACCOUNT NO.:  [id number]   MEDICAL RECORD NO.:  [id number]          PATIENT TYPE:  INP   LOCATION:                               FACILITY:  [facility]   PHYSICIAN:  Salvatore Decent. Cornelius Moras, M.D. DATE OF BIRTH:  1956-03-08   DATE OF ADMISSION:  06/15/2004  DATE OF DISCHARGE:  06/20/2004                                HISTORY & PHYSICAL   CHIEF COMPLAINT:  Evaluation for coronary artery bypass grafting.   HISTORY OF PRESENT ILLNESS:  [name] is a 55 year old male from  [place], [place] with a remote history of coronary artery disease.  The patient also has a past medical history of hypertension, hyperlipidemia,  and gastroesophageal reflux disease.  The patient originally was evaluated  by Dr. Katrinka Blazing in March 2004.  At that time he had some mild symptoms of chest  pain and he underwent a stress test and cardiac catheterization.  He was  found to have two-vessel coronary artery disease which at that time was not  high grade.  The patient was treated medically, had done well until  recently.  [name] now describes a two-month history of progressive  symptoms as classical angina.  He describes squeezing substernal chest pain  that radiates across his left chest and up to the neck and his arm.  This  for the most part has been associated with physical activity and relieved  with rest.  His symptoms have been increasing in frequency and severity in  recent weeks and recently he has had an episode of chest pain and shortness  of breath that awoke him from sleep at night.  He returned to Dr. Katrinka Blazing and  subsequently was scheduled for cardiac catheterization that was performed on  June 15, 2004.  Catheterization demonstrated progression of his severe two-  vessel coronary artery disease with coronary anatomy relatively unfavorable  for a percutaneous coronary intervention.  Left ventricular function was  preserved.   PAST MEDICAL HISTORY:  1.   Hypertension.  2.  Hyperlipidemia.  3.  Gastroesophageal reflux disease.  4.  Left knee arthroscopy in 2002.   SOCIAL HISTORY:  Denies any alcohol, tobacco, or illicit drug use.  He is  currently separated, father of two.   FAMILY HISTORY:  Father positive for coronary artery disease.  Mother  positive for hypertension.  Siblings:  A total of five, two positive for  hypertension.   ALLERGIES:  1.  PENICILLIN.  2.  DYE.   REVIEW OF SYSTEMS:  GENERAL:  Denies any recent changes in weight, fever,  night sweats, or chills.  HEENT:  Denies any history of migraines, recent  changes in vision or hearing, difficulty swallowing.  RESPIRATORY:  Denies  any shortness of breath, cough, hemoptysis, or wheezing.  CARDIOVASCULAR:  See HPI.  GASTROINTESTINAL:  Denies any nausea, vomiting, abdominal pain,  change in bowel movements, constipation, melena, hematochezia.  GENITOURINARY:  Denies any urgency, frequency, dysuria, hematuria.  MUSCULOSKELETAL:  Denies joint pain.  VASCULAR:  Denies any easy bleeding or  bruising.  NEUROLOGIC:  Denies any history of cerebrovascular event,  transient ischemic attack,  numbness, weakness.   PHYSICAL EXAMINATION:  GENERAL:  Well-developed, well-nourished, white male  in no acute distress.  VITAL SIGNS:  Temperature of 97.4, pulse 65, respiratory rate 20, blood  pressure 131/76.  Preoperative weight of 196 pounds.  HEENT:  Normocephalic, atraumatic.  Pupils are equal, round, and reactive to  light and accommodation.  Tympanic membranes intact.  Oral mucosa is moist  and pink.  RESPIRATORY:  Clear to auscultation bilaterally.  CARDIAC:  Regular rate and rhythm.  S1 and S2 noted.  No murmurs, gallops,  or rubs noted.  ABDOMEN:  Bowel sounds x 4.  Soft, nontender on palpation.  No  hepatosplenomegaly noted.  GENITALIA:  Deferred.  RECTAL:  Deferred.  EXTREMITIES:  No edema, cyanosis, or clubbing noted.  The patient has 2+  bilateral radial, femoral, and  dorsalis pedal pulses noted.  NEUROLOGIC:  Cranial nerves II-XII intact.  The patient is alert and  oriented x 4.  Muscle strength 5/5 noted bilateral upper and lower  extremities.   IMPRESSION:  [name] is a 55 year old male seen with severe two-  vessel coronary artery disease.  The patient was seen and evaluated by Dr.  Cornelius Moras.   PLAN:  Dr. Cornelius Moras discussed with [name] undergoing coronary artery  bypass grafting.  He discussed the risks and benefits of this procedure  including death, myocardial infarction, stroke, as well as bleeding,  infection, and pneumonia.  He acknowledges understanding.  [name]  does wish to proceed and is scheduled to undergo the above mentioned  procedure.  The patient will have all of his preadmission testing done prior  to this.      KMD/MEDQ  D:  08/30/2004  T:  08/30/2004  Job:  [id number]

## 2010-09-01 NOTE — Cardiovascular Report (Signed)
NAME:  Scott Roth, Scott Roth                      ACCOUNT NO.:  [id number]   MEDICAL RECORD NO.:  [id number]                   PATIENT TYPE:  INP   LOCATION:  3714                                 FACILITY:  [facility]   PHYSICIAN:  Lesleigh Noe, M.D.            DATE OF BIRTH:  02/12/1956   DATE OF PROCEDURE:  06/18/2002  DATE OF DISCHARGE:  06/18/2002                              CARDIAC CATHETERIZATION   INDICATIONS FOR PROCEDURE:  Recent prolonged episodes of chest discomfort,  trace elevation in troponins, normal CPKs, nonspecific EKG changes.   PROCEDURE PERFORMED:  1. Left heart catheterization.  2. Selective coronary angiography.  3. Left ventriculography.   DESCRIPTION OF PROCEDURE:  After informed consent, a 6-French sheath was  placed in the right femoral artery using the modified Seldinger technique.  A 6-French A2 multipurpose catheter was used for hemodynamic recordings,  left ventriculography, selective left and right coronary angiography.  The  patient tolerated the procedure without complications.   RESULTS:  1. Hemodynamic recordings     A. Aortic pressure 136/88.     B. Left ventricular pressure 136/5.  2. Left ventriculography:  The left ventricle is normal in size and     demonstrates normal overall contractility and possibly a suggestion of     anterior wall hypokinesis.  EF 60%.  3. Coronary angiography     A. Left main coronary:  Normal.     B. Left anterior descending coronary:  The LAD has luminal irregularities        with up to 50% to 70% proximal LAD in the region where the first        diagonal arises from it.  The first diagonal also has 50% to 70%        ostial narrowing.  The LAD supplies the anterior wall but does not        wrap around the left ventricular apex.     C. Circumflex artery:  The circumflex coronary artery is relatively        large, giving origin to 3 obtuse marginal branches.  The first is        small; the second is large; the  third is moderate.  There is segmental        narrowing in the circumflex beyond the second obtuse marginal that        obstructs the artery by 60% to 70%.     D. Right coronary:  The right coronary is dominant.  There is a large        PDA, 3 left ventricular branches.  Minimal luminal irregularity.  The        PDA wraps around the apex.   CONCLUSION:  1. Moderate to moderately severe coronary disease involving the left     anterior descending artery and distal circumflex as outlined above.  2. Normal left ventricular function with ejection fraction of 60.    PLAN:  Aggressive risk factor modification.  Outpatient stress Cardiolite to  rule out evidence of ischemia in the LAD and circumflex territories.                                               Lesleigh Noe, M.D.    HWS/MEDQ  D:  06/18/2002  T:  06/18/2002  Job:  [id number]   cc:   [name], N.P. [facility]

## 2010-09-01 NOTE — Consult Note (Signed)
NAMEDOLAN, XIA            ACCOUNT NO.:  [id number]   MEDICAL RECORD NO.:  [id number]          PATIENT TYPE:  OIB   LOCATION:  6501                         FACILITY:  [facility]   PHYSICIAN:  Salvatore Decent. Cornelius Moras, M.D. DATE OF BIRTH:  [birth year]   DATE OF CONSULTATION:  06/15/2004  DATE OF DISCHARGE:                                   CONSULTATION   REFERRING PHYSICIAN:  Lyn Records, M.D.   REASON FOR CONSULTATION:  Severe two vessel coronary artery disease with  class III unstable angina.   HISTORY OF PRESENT ILLNESS:  Mr. Shaul is a 55 year old male from  [place], [place], with a known history of coronary artery disease,  hypertension, hyperlipidemia, and GE reflux disease.  The patient originally  was evaluated by Dr. Katrinka Blazing in March of 2004.  At that time, he had had some  mild symptoms of chest pain and he underwent a stress test and cardiac  catheterization.  He was found to have two vessel coronary artery disease  which at that time was not high grade.  The patient was treated medically  and has done well until recently.  Mr. Obst now describes a two-month  history of progressive symptoms of classical angina.  He describes squeezing  substernal chest pain that radiates across his left chest and up to the neck  and to his arm.  This, for the most part, has occurred with physical  activity and is always relieved by rest.  The symptoms have increased in  frequency and severity in recent weeks, and recently he also had an episode  of chest pain and shortness of breath that awoke him from his sleep at  night.  He returned to Dr. Katrinka Blazing and subsequently was scheduled for cardiac  catheterization that was performed today.  Catheterization demonstrates  progression of severe two vessel coronary artery disease with coronary  anatomy relatively unfavorable for percutaneous coronary intervention.  Left  ventricular function is preserved.  Cardiac surgical  consultation has been  requested.   CURRENT MEDICATIONS:  1.  Hydrochlorothiazide 25 mg daily.  2.  Aspirin 81 mg daily.  3.  Toprol XL 50 mg daily.  4.  Caduet 5/20 one tablet daily.  5.  Fish oil one capsule daily.  6.  Garlic.   ALLERGIES:  1.  PENICILLIN causes rash.  2.  There is a questionable history of IV CONTRAST sensitivity.   REVIEW OF SYMPTOMS:  GENERAL:  The patient reports feeling well otherwise.  He has good appetite and has actually been gaining weight in recent years  due to progressive decreased physical activity.  CARDIAC:  Notable for  classical symptoms of angina as described.  The patient has mild exertional  shortness of breath.  The patient denies resting shortness of breath,  although he did have one episode of PND associated with chest pain not too  long ago.  He has occasional dizzy spells.  He denies any palpitations or  syncope.  He reports no lower extremity edema.  RESPIRATORY:  Negative.  The  patient denies productive cough, hemoptysis, wheezing.  GASTROINTESTINAL:  Notable only  for frequent belching.  The patient has no difficulty  swallowing.  He denies hematochezia, hematemesis, melena.  MUSCULOSKELETAL:  Notable for mild arthritis afflicting the fingers of both hands and his left  knee.  NEUROLOGIC:  Negative.  HEENT:  Negative.  GENITOURINARY:  Notable  for mild symptoms of nocturia and urinary dribbling.  The patient denies  urinary urgency or frequency.  PSYCHIATRIC:  Negative.  ENDOCRINE:  Negative.   PAST MEDICAL HISTORY:  1.  Coronary artery disease.  2.  Hypertension.  3.  Hyperlipidemia.  4.  Gastroesophageal reflux disease.   PAST SURGICAL HISTORY:  Status post left knee surgery x2.   SOCIAL HISTORY:  The patient is divorced and currently lives with a close  friend here in [place], [place].  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  He has been physically  active and athletic all of  his life.  He is a nonsmoker and denies history  of excessive alcohol consumption.   FAMILY HISTORY:  The patient's family has numerous members with coronary  artery disease, although there is no history of premature coronary artery  disease.   PHYSICAL EXAMINATION:  GENERAL APPEARANCE:  The patient is a well-appearing  white male who appears his stated age in no acute distress.  HEENT:  Examination is grossly unrevealing.  NECK:  Supple.  There is no cervical or supraclavicular lymphadenopathy.  There is no jugular venous distension.  No carotid bruits are noted.  CHEST:  Auscultation of the chest includes clear and symmetrical breath  sounds bilaterally.  No wheezes or rhonchi are demonstrated.  CARDIOVASCULAR:  Regular rate and rhythm.  No murmurs, rubs, or gallops are  noted.  ABDOMEN:  Mildly obese but soft and nontender.  There are no palpable  masses.  Bowel sounds are present.  EXTREMITIES:  The extremities are warm and well perfused.  There is no lower  extremity edema.  Distal pulses are easily palpable in both lower legs at  the ankle.  RECTAL AND GU:  Exams are both deferred.  NEUROLOGIC:  Examination is grossly nonfocal.   DIAGNOSTIC TESTS:  Cardiac catheterization performed by Dr. Katrinka Blazing is  reviewed.  This reveals severe two vessel coronary artery disease with  normal left ventricular function.  Specifically, there is long segment 80 to  90% stenosis of the mid left anterior descending coronary artery arising  both before, during and after takeoff of a large bifurcating second diagonal  branch. There is long segment 95 to 99% stenosis of the mid left circumflex  coronary artery arising at takeoff of a large second circumflex marginal  branch and before a medium size third circumflex marginal branch. There is  right dominant coronary circulation and the right coronary artery is free of any significant disease.  Left ventricular function appears normal with no   significant wall motion abnormalities.   IMPRESSION:  Severe two vessel coronary artery disease with class III  unstable angina and normal left ventricular function.  Mr. Bowdish has  coronary anatomy that appears unfavorable for percutaneous coronary  intervention.  I believe he would best be treated by coronary artery bypass  grafting.   PLAN:  I have outlined options with Mr. Strike this afternoon.  Alternative treatment strategies have been discussed in detail.  He  understands and accepts all associated risks of surgery including, but not  limited to risk of  death, stroke, myocardial infarction, congestive heart failure, respiratory  failure, pneumonia, bleeding requiring  blood transfusion, arrhythmia,  infection, and recurrent coronary artery disease.  All of his questions have  been addressed.      CHO/MEDQ  D:  06/15/2004  T:  06/15/2004  Job:  [id number]   cc:   Adrian Saran, N.P.

## 2011-01-15 LAB — PROTIME-INR
INR: 1
INR: 1.5
Prothrombin Time: 12.5
Prothrombin Time: 18.3 — ABNORMAL HIGH

## 2011-01-15 LAB — COMPREHENSIVE METABOLIC PANEL
ALT: 42
CO2: 26
Calcium: 9.7
Creatinine, Ser: 0.89
GFR calc Af Amer: >60
GFR calc non Af Amer: >60
Glucose, Bld: 92
Total Bilirubin: 1.5 — ABNORMAL HIGH

## 2011-01-15 LAB — BASIC METABOLIC PANEL
BUN: 14
Calcium: 8.3 — ABNORMAL LOW
Creatinine, Ser: 1.45
Creatinine, Ser: 1.61 — ABNORMAL HIGH
GFR calc Af Amer: 55 — ABNORMAL LOW
GFR calc Af Amer: >60
GFR calc non Af Amer: 45 — ABNORMAL LOW
GFR calc non Af Amer: 51 — ABNORMAL LOW
Glucose, Bld: 139 — ABNORMAL HIGH
Sodium: 135

## 2011-01-15 LAB — CBC
HCT: 36.7 — ABNORMAL LOW
HCT: 46.7
Hemoglobin: 11.7 — ABNORMAL LOW
Hemoglobin: 12.6 — ABNORMAL LOW
Hemoglobin: 15.8
MCHC: 34.4
MCHC: 35.3
MCV: 94.6
MCV: 94.8
Platelets: 223
RBC: 3.57 — ABNORMAL LOW
RDW: 13.7
WBC: 8.8

## 2011-01-15 LAB — APTT: aPTT: 30

## 2011-01-15 LAB — TYPE AND SCREEN: ABO/RH(D): O POS

## 2011-01-15 LAB — URINALYSIS, ROUTINE W REFLEX MICROSCOPIC
Bilirubin Urine: NEGATIVE
Glucose, UA: NEGATIVE
Hgb urine dipstick: NEGATIVE
Specific Gravity, Urine: 1.028
pH: 5.5

## 2011-01-15 LAB — ABO/RH: ABO/RH(D): O POS

## 2011-01-26 LAB — BASIC METABOLIC PANEL
BUN: 11
CO2: 26
Calcium: 9.3
Creatinine, Ser: 0.93
GFR calc Af Amer: >60
GFR calc non Af Amer: >60
Glucose, Bld: 105 — ABNORMAL HIGH
Potassium: 3.8
Sodium: 137

## 2011-01-26 LAB — CBC
MCHC: 35.1
MCV: 90.4
RBC: 4.64
RDW: 13.6

## 2013-05-10 ENCOUNTER — Other Ambulatory Visit: Payer: Self-pay | Admitting: *Deleted

## 2013-05-10 DIAGNOSIS — E785 Hyperlipidemia, unspecified: Secondary | ICD-10-CM

## 2013-06-15 ENCOUNTER — Other Ambulatory Visit: Payer: Self-pay

## 2013-08-22 ENCOUNTER — Encounter: Payer: Self-pay | Admitting: *Deleted

## 2013-08-22 DIAGNOSIS — E785 Hyperlipidemia, unspecified: Secondary | ICD-10-CM | POA: Insufficient documentation

## 2013-08-22 DIAGNOSIS — I2510 Atherosclerotic heart disease of native coronary artery without angina pectoris: Secondary | ICD-10-CM | POA: Insufficient documentation

## 2013-08-22 DIAGNOSIS — I10 Essential (primary) hypertension: Secondary | ICD-10-CM | POA: Insufficient documentation

## 2013-08-31 ENCOUNTER — Encounter: Payer: Self-pay | Admitting: Interventional Cardiology

## 2013-08-31 ENCOUNTER — Ambulatory Visit (INDEPENDENT_AMBULATORY_CARE_PROVIDER_SITE_OTHER): Payer: Self-pay | Admitting: Interventional Cardiology

## 2013-08-31 VITALS — BP 124/84 | HR 83 | Ht 67.0 in | Wt 206.4 lb

## 2013-08-31 DIAGNOSIS — I2510 Atherosclerotic heart disease of native coronary artery without angina pectoris: Secondary | ICD-10-CM

## 2013-08-31 DIAGNOSIS — G4730 Sleep apnea, unspecified: Secondary | ICD-10-CM | POA: Insufficient documentation

## 2013-08-31 DIAGNOSIS — E785 Hyperlipidemia, unspecified: Secondary | ICD-10-CM

## 2013-08-31 DIAGNOSIS — R351 Nocturia: Secondary | ICD-10-CM

## 2013-08-31 DIAGNOSIS — I10 Essential (primary) hypertension: Secondary | ICD-10-CM

## 2013-08-31 LAB — COMPREHENSIVE METABOLIC PANEL
ALBUMIN: 4.1 g/dL (ref 3.5–5.2)
ALT: 24 U/L (ref 0–53)
AST: 29 U/L (ref 0–37)
Alkaline Phosphatase: 59 U/L (ref 39–117)
BUN: 13 mg/dL (ref 6–23)
CALCIUM: 9.2 mg/dL (ref 8.4–10.5)
CHLORIDE: 105 meq/L (ref 96–112)
CO2: 24 meq/L (ref 19–32)
Creatinine, Ser: 0.9 mg/dL (ref 0.4–1.5)
GFR: 89.73 mL/min (ref >60.00)
Glucose, Bld: 97 mg/dL (ref 70–99)
Potassium: 4.4 mEq/L (ref 3.5–5.1)
Sodium: 137 mEq/L (ref 135–145)
Total Bilirubin: 1 mg/dL (ref 0.2–1.2)
Total Protein: 7.2 g/dL (ref 6.0–8.3)

## 2013-08-31 LAB — PSA: PSA: 0.36 ng/mL (ref 0.10–4.00)

## 2013-08-31 LAB — HEMOGLOBIN A1C: HEMOGLOBIN A1C: 6.3 % (ref 4.6–6.5)

## 2013-08-31 NOTE — Patient Instructions (Addendum)
Your physician recommends that you continue on your current medications as directed. Please refer to the Current Medication list given to you today.  Labs Today: Cmet,Hga1c, Cmet, Lipid, Alt  Your physician wants you to follow-up in: 1 year You will receive a reminder letter in the mail two months in advance. If you don't receive a letter, please call our office to schedule the follow-up appointment.

## 2013-08-31 NOTE — Progress Notes (Signed)
Patient ID: Scott Roth, male   DOB: [birth year], 58 y.o.   MRN: [id number]    [address] [place], [place]  [address] Phone: [phone] Fax:  [phone]  Date:  08/31/2013   ID:  Scott Roth, DOB [birth year], MRN [id number]  PCP:  No primary provider on file.   ASSESSMENT:  1. Coronary artery disease without current anginal complaints 2. Hypertension with borderline control 3. Hyperlipidemia on therapy 4. Sleep apnea 5. Nocturia  PLAN:  1. To evaluate nocturia we will get a PSA, and blood glucose. 2. Fasting statin and liver panel 3. Hemoglobin A1c 4. I encouraged aerobic activity 5. Clinic followup in one year   SUBJECTIVE: Scott Roth is a 58 y.o. male is now not working. 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 He states he is out of energy quickly. He has history of CAD and denies angina. He has not had claudication. He has sleep apnea but has not excepting of therapy. He complains of nocturia. This awakens him 3-5 times per night. Small urinary volume with each encounter.   Wt Readings from Last 3 Encounters:  08/31/13 206 lb 6.4 oz (93.622 kg)     Past Medical History  Diagnosis Date  . CAD (coronary artery disease)   . Hypertension   . Hyperlipidemia   . Erectile dysfunction   . Tubular adenoma     Current Outpatient Prescriptions  Medication Sig Dispense Refill  . Cyanocobalamin (B-12 PO) Take by mouth.      . losartan-hydrochlorothiazide (HYZAAR) 100-25 MG per tablet Take 1 tablet by mouth daily.      . Multiple Vitamins-Minerals (MULTIVITAMIN PO) Take by mouth.      . Pyridoxine HCl (B-6 PO) Take by mouth.      . ranitidine (ZANTAC) 150 MG tablet Take 150 mg by mouth 2 (two) times daily.      . vitamin C (ASCORBIC ACID) 500 MG tablet Take 500 mg by mouth daily.      Marland Kitchen VITAMIN E PO Take by mouth.       No current facility-administered medications for this visit.    Allergies:    Allergies  Allergen Reactions  . Penicillins      Social History:  The patient  reports that he has never smoked. He does not have any smokeless tobacco history on file. He reports that he does not drink alcohol or use illicit drugs.   ROS:  Please see the history of present illness.   Gaining weight. Decreased exertional tolerance. No chest discomfort no nitroglycerin use. Sudden episodes of weakness and dizziness.   All other systems reviewed and negative.   OBJECTIVE: VS:  BP 124/84  Pulse 83  Ht 5\' 7"  (1.702 m)  Wt 206 lb 6.4 oz (93.622 kg)  BMI 32.32 kg/m2 Well nourished, well developed, in no acute distress, the patient than stated age, obese. HEENT: normal Neck: JVD flat. Carotid bruit absent  Cardiac:  normal S1, S2; RRR; no murmur Lungs:  clear to auscultation bilaterally, no wheezing, rhonchi or rales Abd: soft, nontender, no hepatomegaly Ext: Edema absent. Pulses 2+ and symmetric Skin: warm and dry Neuro:  CNs 2-12 intact, no focal abnormalities noted  EKG:  Normal sinus rhythm with normal-appearing tracing       Signed, Illene Labrador III, MD 08/31/2013 12:08 PM

## 2013-09-02 ENCOUNTER — Ambulatory Visit: Payer: Self-pay

## 2013-09-02 DIAGNOSIS — E785 Hyperlipidemia, unspecified: Secondary | ICD-10-CM

## 2013-09-02 LAB — LIPID PANEL
CHOL/HDL RATIO: 5
Cholesterol: 173 mg/dL (ref 0–200)
HDL: 38.2 mg/dL — ABNORMAL LOW (ref >39.00)
LDL CALC: 99 mg/dL (ref 0–99)
TRIGLYCERIDES: 180 mg/dL — AB (ref 0.0–149.0)
VLDL: 36 mg/dL (ref 0.0–40.0)

## 2013-09-02 LAB — ALT: ALT: 27 U/L (ref 0–53)

## 2013-09-08 ENCOUNTER — Telehealth: Payer: Self-pay

## 2013-09-08 NOTE — Telephone Encounter (Signed)
Message copied by Lamar Laundry on Tue Sep 08, 2013  2:20 PM ------      Message from: Daneen Schick      Created: Tue Sep 01, 2013  7:29 PM       CMET is normal ------

## 2013-09-08 NOTE — Telephone Encounter (Signed)
Message copied by Lamar Laundry on Tue Sep 08, 2013  2:22 PM ------      Message from: Daneen Schick      Created: Tue Sep 01, 2013  7:29 PM       CMET is normal ------

## 2013-09-08 NOTE — Telephone Encounter (Signed)
lmom labs normal 

## 2014-02-05 ENCOUNTER — Other Ambulatory Visit: Payer: Self-pay

## 2014-02-05 MED ORDER — LOSARTAN POTASSIUM-HCTZ 100-25 MG PO TABS: 1.0000 | ORAL_TABLET | Freq: Every day | ORAL | Status: DC

## 2014-03-26 ENCOUNTER — Telehealth: Payer: Self-pay

## 2014-03-26 MED ORDER — LOSARTAN POTASSIUM-HCTZ 100-25 MG PO TABS: 1.0000 | ORAL_TABLET | Freq: Every day | ORAL | Status: DC

## 2014-03-26 MED ORDER — SIMVASTATIN 40 MG PO TABS: 40.0000 mg | ORAL_TABLET | Freq: Every day | ORAL | Status: DC

## 2014-03-26 NOTE — Telephone Encounter (Signed)
F/U   Pt returning call. States he out of all meds as well. Please call back at [phone].

## 2014-03-26 NOTE — Telephone Encounter (Signed)
Called to verify with pt if he is currently taking Simvastatin 40mg  qd. lmtcb

## 2014-03-26 NOTE — Telephone Encounter (Signed)
Verified pt is currently taking Simvastatin 40mg . Refill for Hyzaar and Simvastatin sent to pt pharmacy.  Pt reports that he is having some muscle ache/pain related to his statin therapy. He would like Dr.Smith's recommendation. Adv him I will fwd him a message and call back with his recommendation. Pt verbalized understanding.

## 2014-03-31 NOTE — Telephone Encounter (Signed)
Coenzyme Q10  300 mg daily

## 2014-04-01 MED ORDER — CO Q-10 300 MG PO CAPS: 1.0000 | ORAL_CAPSULE | Freq: Every day | ORAL | Status: DC

## 2014-04-01 NOTE — Telephone Encounter (Signed)
Pt given Dr.Smith's recommendation. Start Coenzyme Q10 300 mg daily. Pt agreeable

## 2014-04-01 NOTE — Addendum Note (Signed)
Addended by: Lamar Laundry on: 04/01/2014 03:08 PM   Modules accepted: Orders

## 2014-05-13 ENCOUNTER — Telehealth: Payer: Self-pay | Admitting: Interventional Cardiology

## 2014-05-13 ENCOUNTER — Telehealth: Payer: Self-pay | Admitting: *Deleted

## 2014-05-13 NOTE — Telephone Encounter (Signed)
There is no problem as long as they are careful not to get mixed up and double dose.

## 2014-05-13 NOTE — Telephone Encounter (Signed)
Routed to Dr.Smith for approval 

## 2014-05-13 NOTE — Telephone Encounter (Signed)
I accidentally closed the prior message. It is okay to do the prescription as requested as long as they are careful not to double dose.

## 2014-05-13 NOTE — Telephone Encounter (Signed)
Patients wife called in and wants to know if the patient can get two separate rx's for the hyzaar. She stated that she has some of the hctz that is hers that she is going to let him take, and it is cheaper to buy them separately. Is this change ok? Please advise. Thanks, MI

## 2014-05-19 ENCOUNTER — Telehealth: Payer: Self-pay | Admitting: Interventional Cardiology

## 2014-05-19 MED ORDER — LOSARTAN POTASSIUM 100 MG PO TABS: 100.0000 mg | ORAL_TABLET | Freq: Every day | ORAL | Status: DC

## 2014-05-19 MED ORDER — HYDROCHLOROTHIAZIDE 25 MG PO TABS: 25.0000 mg | ORAL_TABLET | Freq: Every day | ORAL | Status: DC

## 2014-05-19 NOTE — Telephone Encounter (Signed)
Called to give pt Dr.Smiths response. Hyzaar 100/25 mg d/c. Rx sent to pt pharmacy for Losartan 100mg  qd, and HCTZ 25mg  qd.

## 2014-05-19 NOTE — Telephone Encounter (Signed)
New message       Returning Scott Roth's call

## 2014-05-19 NOTE — Telephone Encounter (Signed)
pt aware of Dr.Smith's response and that Rx has been sent ti his pharmacy. pt verbalized understanding.

## 2014-05-19 NOTE — Addendum Note (Signed)
Addended by: Lamar Laundry on: 05/19/2014 08:24 AM   Modules accepted: Orders, Medications

## 2014-05-19 NOTE — Telephone Encounter (Signed)
error 

## 2014-08-03 ENCOUNTER — Telehealth: Payer: Self-pay

## 2014-08-03 NOTE — Telephone Encounter (Signed)
Signed cardiac clearance placed in MR fax box to fax to [facility] attn:Sherri fax # [phone]

## 2014-08-10 ENCOUNTER — Other Ambulatory Visit: Payer: Self-pay | Admitting: Physician Assistant

## 2014-08-10 NOTE — H&P (Signed)
TOTAL KNEE ADMISSION H&P  Patient is being admitted for right total knee arthroplasty.  Subjective:  Chief Complaint:right knee pain.  HPI: Scott Roth, 59 y.o. male, has a history of pain and functional disability in the right knee due to arthritis and has failed non-surgical conservative treatments for greater than 12 weeks to includeNSAID's and/or analgesics, corticosteriod injections and activity modification.  Onset of symptoms was gradual, starting 7 years ago with gradually worsening course since that time. The patient noted no past surgery on the right knee(s).  Patient currently rates pain in the right knee(s) at 5 out of 10 with activity. Patient has worsening of pain with activity and weight bearing.  Patient has evidence of subchondral sclerosis by imaging studies. There is no active infection.  Patient Active Problem List   Diagnosis Date Noted  . Nocturia 08/31/2013  . Sleep apnea 08/31/2013  . Hypertension   . Hyperlipidemia   . CAD (coronary artery disease)    Past Medical History  Diagnosis Date  . CAD (coronary artery disease)   . Hypertension   . Hyperlipidemia   . Erectile dysfunction   . Tubular adenoma     No past surgical history on file.   (Not in a hospital admission) Allergies  Allergen Reactions  . Penicillins     History  Substance Use Topics  . Smoking status: Never Smoker   . Smokeless tobacco: Not on file  . Alcohol Use: No    Family History  Problem Relation Age of Onset  . Heart failure Father      Review of Systems  Constitutional: Negative.   HENT: Negative.   Eyes: Negative.   Respiratory: Negative.   Cardiovascular: Negative.   Gastrointestinal: Negative.   Genitourinary: Negative.   Musculoskeletal: Positive for joint pain.  Skin: Negative.   Neurological: Negative.   Endo/Heme/Allergies: Negative.   Psychiatric/Behavioral: Negative.     Objective:  Physical Exam  Constitutional: He is oriented to person,  place, and time. He appears well-developed and well-nourished.  HENT:  Head: Normocephalic and atraumatic.  Eyes: EOM are normal. Pupils are equal, round, and reactive to light.  Neck: Normal range of motion. Neck supple.  Cardiovascular: Normal rate and regular rhythm.  Exam reveals no gallop and no friction rub.   No murmur heard. Respiratory: Effort normal and breath sounds normal. No respiratory distress. He has no wheezes. He has no rales.  GI: Soft. Bowel sounds are normal.  Musculoskeletal:  On the right he has 5 degrees fixed varus.  Motion 3 to about 110.  Tibiofemoral and patellofemoral crepitus.  Neurovascularly intact distally  Neurological: He is alert and oriented to person, place, and time.  Skin: Skin is warm and dry.  Psychiatric: He has a normal mood and affect. His behavior is normal. Judgment and thought content normal.    Vital signs in last 24 hours: @VSRANGES @  Labs:   Estimated body mass index is 32.32 kg/(m^2) as calculated from the following:   Height as of 08/31/13: 5\' 7"  (1.702 m).   Weight as of 08/31/13: 93.622 kg (206 lb 6.4 oz).   Imaging Review Plain radiographs demonstrate severe degenerative joint disease of the right knee(s). The overall alignment ismild varus. The bone quality appears to be fair for age and reported activity level.  Assessment/Plan:  End stage arthritis, right knee   The patient history, physical examination, clinical judgment of the provider and imaging studies are consistent with end stage degenerative joint disease of the right  knee(s) and total knee arthroplasty is deemed medically necessary. The treatment options including medical management, injection therapy arthroscopy and arthroplasty were discussed at length. The risks and benefits of total knee arthroplasty were presented and reviewed. The risks due to aseptic loosening, infection, stiffness, patella tracking problems, thromboembolic complications and other imponderables  were discussed. The patient acknowledged the explanation, agreed to proceed with the plan and consent was signed. Patient is being admitted for inpatient treatment for surgery, pain control, PT, OT, prophylactic antibiotics, VTE prophylaxis, progressive ambulation and ADL's and discharge planning. The patient is planning to be discharged home with home health services

## 2014-08-11 NOTE — Pre-Procedure Instructions (Signed)
Scott Roth  08/11/2014   Your procedure is scheduled on:  Wednesday Aug 25, 2014 at 10:55 AM.  Report to [facility] Admitting at 8:55 AM.  Call this number if you have problems the morning of surgery: [phone]   Remember:   Do not eat food or drink liquids after midnight.   Take these medicines the morning of surgery with A SIP OF WATER: Nexium, Claritin if needed   Please stop taking any vitamins, herbal medications, Advil, Ibuprofen, Motrin, Alleve, etc on Wednesday May 4th   Do not wear jewelry.  Do not wear lotions, powders, or cologne.   Men may shave face and neck.  Do not bring valuables to the hospital.  [facility] is not responsible for any belongings or valuables.               Contacts, dentures or bridgework may not be worn into surgery.  Leave suitcase in the car. After surgery it may be brought to your room.  For patients admitted to the hospital, discharge time is determined by your treatment team.               Patients discharged the day of surgery will not be allowed to drive home.  Name and phone number of your driver:   Special Instructions: Shower using CHG soap the night before and the morning of your surgery   Please read over the following fact sheets that you were given: Pain Booklet, Coughing and Deep Breathing, Blood Transfusion Information, Total Joint Packet, MRSA Information and Surgical Site Infection Prevention

## 2014-08-12 ENCOUNTER — Encounter (HOSPITAL_COMMUNITY)
Admission: RE | Admit: 2014-08-12 | Discharge: 2014-08-12 | Disposition: A | Discharge status: Home / Self Care | Payer: BLUE CROSS/BLUE SHIELD | Source: Ambulatory Visit | Attending: Orthopedic Surgery | Admitting: Orthopedic Surgery

## 2014-08-12 ENCOUNTER — Encounter (HOSPITAL_COMMUNITY): Payer: Self-pay

## 2014-08-12 HISTORY — DX: Sleep apnea, unspecified: G47.30

## 2014-08-12 HISTORY — DX: Unspecified osteoarthritis, unspecified site: M19.90

## 2014-08-12 HISTORY — DX: Gastro-esophageal reflux disease without esophagitis: K21.9

## 2014-08-12 LAB — COMPREHENSIVE METABOLIC PANEL
ALBUMIN: 4 g/dL (ref 3.5–5.2)
ALT: 25 U/L (ref 0–53)
AST: 25 U/L (ref 0–37)
Alkaline Phosphatase: 62 U/L (ref 39–117)
Anion gap: 8 (ref 5–15)
BUN: 10 mg/dL (ref 6–23)
CALCIUM: 9.5 mg/dL (ref 8.4–10.5)
CO2: 27 mmol/L (ref 19–32)
Chloride: 104 mmol/L (ref 96–112)
Creatinine, Ser: 0.88 mg/dL (ref 0.50–1.35)
GFR calc Af Amer: >90 mL/min (ref >90)
GFR calc non Af Amer: >90 mL/min (ref >90)
GLUCOSE: 101 mg/dL — AB (ref 70–99)
Potassium: 4.2 mmol/L (ref 3.5–5.1)
Sodium: 139 mmol/L (ref 135–145)
Total Bilirubin: 1 mg/dL (ref 0.3–1.2)
Total Protein: 7.1 g/dL (ref 6.0–8.3)

## 2014-08-12 LAB — CBC WITH DIFFERENTIAL/PLATELET
BASOS ABS: 0.1 10*3/uL (ref 0.0–0.1)
BASOS PCT: 1 % (ref 0–1)
EOS ABS: 0.2 10*3/uL (ref 0.0–0.7)
Eosinophils Relative: 3 % (ref 0–5)
HCT: 47.3 % (ref 39.0–52.0)
Hemoglobin: 16 g/dL (ref 13.0–17.0)
Lymphocytes Relative: 27 % (ref 12–46)
Lymphs Abs: 2.2 10*3/uL (ref 0.7–4.0)
MCH: 31.9 pg (ref 26.0–34.0)
MCHC: 33.8 g/dL (ref 30.0–36.0)
MCV: 94.4 fL (ref 78.0–100.0)
Monocytes Absolute: 0.7 10*3/uL (ref 0.1–1.0)
Monocytes Relative: 9 % (ref 3–12)
NEUTROS PCT: 60 % (ref 43–77)
Neutro Abs: 4.9 10*3/uL (ref 1.7–7.7)
PLATELETS: 296 10*3/uL (ref 150–400)
RBC: 5.01 MIL/uL (ref 4.22–5.81)
RDW: 14.1 % (ref 11.5–15.5)
WBC: 8.1 10*3/uL (ref 4.0–10.5)

## 2014-08-12 LAB — SURGICAL PCR SCREEN
MRSA, PCR: NEGATIVE
Staphylococcus aureus: NEGATIVE

## 2014-08-12 LAB — URINALYSIS, ROUTINE W REFLEX MICROSCOPIC
BILIRUBIN URINE: NEGATIVE
GLUCOSE, UA: NEGATIVE mg/dL
Hgb urine dipstick: NEGATIVE
Ketones, ur: NEGATIVE mg/dL
Leukocytes, UA: NEGATIVE
NITRITE: NEGATIVE
PH: 6 (ref 5.0–8.0)
Protein, ur: NEGATIVE mg/dL
SPECIFIC GRAVITY, URINE: 1.016 (ref 1.005–1.030)
Urobilinogen, UA: 0.2 mg/dL (ref 0.0–1.0)

## 2014-08-12 LAB — PROTIME-INR
INR: 0.95 (ref 0.00–1.49)
Prothrombin Time: 12.8 seconds (ref 11.6–15.2)

## 2014-08-12 LAB — APTT: aPTT: 35 seconds (ref 24–37)

## 2014-08-12 NOTE — Progress Notes (Signed)
Ria Comment, [place]sey returned call and informed Nurse that Ancef which was the antibiotic ordered for surgery was OK for patient to have since the patients reaction was not anaphylaxis. Nurse thanked Ria Comment for returning call. Call ended.

## 2014-08-12 NOTE — Progress Notes (Signed)
Nurse called Dr. Debroah Loop office and spoke with staff about patient having a penicillin allergy and having Ancef ordered for surgery. Office staff stated Dr. Percell Miller and PA's were in surgery today but she would have someone return call. Direct number left.

## 2014-08-12 NOTE — Progress Notes (Signed)
Patient recently switched PCP therefore the name is unknown at this time.. Cardiologist is Daneen Schick, and patient is scheduled to have a appointment with him on 08/31/14.   Patient denied having any acute cardiac or pulmonary issues. Patient refused to wear blood bank band therefore type and screen will be done DOS.

## 2014-08-13 NOTE — Progress Notes (Addendum)
Anesthesia Chart Review:  Pt is 59 year old male scheduled for R total knee arthroplasty on 08/25/2014 with Dr. Maryla Morrow.   Cardiologist is Dr. Tamala Julian. Last office visit 08/31/13. Pt sees Dr. Tamala Julian annually, next appt 08/31/14.   PMH includes: CAD (s/p DES to LAD/first diagonal), HTN, hyperlipidemia, OSA, GERD. Never smoker. BMI 33.   Preoperative labs reviewed.    EKG 08/31/2013: NSR.   Cardiac cath 11/26/2006:  1. Low normal LV function. 2. Severe native vessel coronary disease with total occlusion of distal circumflex, 90% stenosis in a small obtuse marginal branch 90% to 95% stenosis in the proximal LAD before the first septal perforator and diagonal. The LAD is protected by left internal mammary graft to the LAD. The saphenous vein graft to the diagonal is occluded. 3. Bypass graft occlusive disease, with occlusion of saphenous vein graft to the obtuse marginal #2, and occlusion of saphenous vein graft to the first diagonal. 4. Patent LIMA to the LAD. 5. Patent free RIMA to the large obtuse marginal number 1. 6. Widely patent native right coronary.  Cardiac cath/PCI 12/02/2006: 1. Successful percutaneous coronary intervention on the left anterior descending reduces stenosis from 90% to 0% after drug-eluting Taxus stent implantation. 2. Ventricular fibrillation treated with countershock without sequela. This was felt to be a consequence of saline flush following intracoronary nitroglycerin administration.  Willeen Cass, FNP-BC [facility]/Anesthesiology Phone: [phone] 08/13/2014 4:39 PM  Addedum:   Pt has cardiac clearance from Dr. Tamala Julian and medical clearance from [name], PA (at [facility]).   If no changes, I anticipate pt can proceed with surgery as scheduled.   Willeen Cass, FNP-BC [facility]/Anesthesiology Phone: [phone] 08/16/2014 1:42 PM

## 2014-08-14 LAB — URINE CULTURE: Colony Count: 1000

## 2014-08-24 MED ORDER — LACTATED RINGERS IV SOLN
INTRAVENOUS | Status: DC
  Administered 2014-08-25 (×2): via INTRAVENOUS

## 2014-08-24 MED ORDER — CEFAZOLIN SODIUM-DEXTROSE 2-3 GM-% IV SOLR
2.0000 g | INTRAVENOUS | Status: AC
  Administered 2014-08-25: 2 g via INTRAVENOUS
  Filled 2014-08-24: qty 50

## 2014-08-25 ENCOUNTER — Inpatient Hospital Stay (HOSPITAL_COMMUNITY)
Admission: RE | Admit: 2014-08-25 | Discharge: 2014-08-26 | DRG: 470 | Disposition: A | Discharge status: Home Health | Payer: BLUE CROSS/BLUE SHIELD | Source: Ambulatory Visit | Attending: Orthopedic Surgery | Admitting: Orthopedic Surgery

## 2014-08-25 ENCOUNTER — Inpatient Hospital Stay (HOSPITAL_COMMUNITY): Payer: BLUE CROSS/BLUE SHIELD | Admitting: Anesthesiology

## 2014-08-25 ENCOUNTER — Inpatient Hospital Stay (HOSPITAL_COMMUNITY): Payer: BLUE CROSS/BLUE SHIELD | Admitting: Emergency Medicine

## 2014-08-25 ENCOUNTER — Encounter (HOSPITAL_COMMUNITY)
Admission: RE | Disposition: A | Discharge status: Home Health | Payer: Self-pay | Source: Ambulatory Visit | Attending: Orthopedic Surgery

## 2014-08-25 ENCOUNTER — Inpatient Hospital Stay (HOSPITAL_COMMUNITY): Payer: BLUE CROSS/BLUE SHIELD

## 2014-08-25 DIAGNOSIS — K219 Gastro-esophageal reflux disease without esophagitis: Secondary | ICD-10-CM | POA: Diagnosis present

## 2014-08-25 DIAGNOSIS — M1711 Unilateral primary osteoarthritis, right knee: Principal | ICD-10-CM | POA: Diagnosis present

## 2014-08-25 DIAGNOSIS — I10 Essential (primary) hypertension: Secondary | ICD-10-CM | POA: Diagnosis present

## 2014-08-25 DIAGNOSIS — Z8249 Family history of ischemic heart disease and other diseases of the circulatory system: Secondary | ICD-10-CM

## 2014-08-25 DIAGNOSIS — N529 Male erectile dysfunction, unspecified: Secondary | ICD-10-CM | POA: Diagnosis present

## 2014-08-25 DIAGNOSIS — Z880 Allergy status to penicillin: Secondary | ICD-10-CM | POA: Diagnosis not present

## 2014-08-25 DIAGNOSIS — E785 Hyperlipidemia, unspecified: Secondary | ICD-10-CM | POA: Diagnosis present

## 2014-08-25 DIAGNOSIS — M25561 Pain in right knee: Secondary | ICD-10-CM | POA: Diagnosis present

## 2014-08-25 DIAGNOSIS — I2510 Atherosclerotic heart disease of native coronary artery without angina pectoris: Secondary | ICD-10-CM | POA: Diagnosis present

## 2014-08-25 DIAGNOSIS — Z79899 Other long term (current) drug therapy: Secondary | ICD-10-CM | POA: Diagnosis not present

## 2014-08-25 DIAGNOSIS — M179 Osteoarthritis of knee, unspecified: Secondary | ICD-10-CM | POA: Diagnosis present

## 2014-08-25 DIAGNOSIS — M1710 Unilateral primary osteoarthritis, unspecified knee: Secondary | ICD-10-CM | POA: Diagnosis present

## 2014-08-25 DIAGNOSIS — G4730 Sleep apnea, unspecified: Secondary | ICD-10-CM | POA: Diagnosis present

## 2014-08-25 DIAGNOSIS — Z96659 Presence of unspecified artificial knee joint: Secondary | ICD-10-CM

## 2014-08-25 HISTORY — PX: TOTAL KNEE ARTHROPLASTY: SHX125

## 2014-08-25 LAB — TYPE AND SCREEN
ABO/RH(D): O POS
ANTIBODY SCREEN: NEGATIVE

## 2014-08-25 SURGERY — ARTHROPLASTY, KNEE, TOTAL
Anesthesia: Spinal | Site: Knee | Laterality: Right

## 2014-08-25 MED ORDER — METHOCARBAMOL 500 MG PO TABS: 500.0000 mg | ORAL_TABLET | Freq: Four times a day (QID) | ORAL | Status: DC

## 2014-08-25 MED ORDER — PROPOFOL 10 MG/ML IV BOLUS
INTRAVENOUS | Status: AC
  Filled 2014-08-25: qty 20

## 2014-08-25 MED ORDER — MENTHOL 3 MG MT LOZG: 1.0000 | LOZENGE | OROMUCOSAL | Status: DC | PRN

## 2014-08-25 MED ORDER — BUPIVACAINE HCL (PF) 0.5 % IJ SOLN
30.0000 mL | Freq: Once | INTRAMUSCULAR | Status: DC
  Filled 2014-08-25: qty 30

## 2014-08-25 MED ORDER — POTASSIUM CHLORIDE IN NACL 20-0.9 MEQ/L-% IV SOLN
INTRAVENOUS | Status: DC
  Administered 2014-08-25: 19:00:00 via INTRAVENOUS
  Filled 2014-08-25 (×3): qty 1000

## 2014-08-25 MED ORDER — CHLORHEXIDINE GLUCONATE 4 % EX LIQD
60.0000 mL | Freq: Once | CUTANEOUS | Status: DC
  Filled 2014-08-25: qty 60

## 2014-08-25 MED ORDER — OXYCODONE HCL 5 MG PO TABS: 5.0000 mg | ORAL_TABLET | Freq: Once | ORAL | Status: DC | PRN

## 2014-08-25 MED ORDER — PHENOL 1.4 % MT LIQD: 1.0000 | OROMUCOSAL | Status: DC | PRN

## 2014-08-25 MED ORDER — MIDAZOLAM HCL 2 MG/2ML IJ SOLN
INTRAMUSCULAR | Status: AC
  Filled 2014-08-25: qty 2

## 2014-08-25 MED ORDER — OXYCODONE HCL 5 MG PO TABS
ORAL_TABLET | ORAL | Status: AC
  Filled 2014-08-25: qty 2

## 2014-08-25 MED ORDER — FENTANYL CITRATE (PF) 100 MCG/2ML IJ SOLN
INTRAMUSCULAR | Status: AC
  Filled 2014-08-25: qty 2

## 2014-08-25 MED ORDER — APIXABAN 2.5 MG PO TABS: ORAL_TABLET | ORAL | Status: DC

## 2014-08-25 MED ORDER — DIPHENHYDRAMINE HCL 25 MG PO CAPS: 25.0000 mg | ORAL_CAPSULE | Freq: Four times a day (QID) | ORAL | Status: DC | PRN

## 2014-08-25 MED ORDER — BUPIVACAINE HCL 0.5 % IJ SOLN
INTRAMUSCULAR | Status: DC | PRN
  Administered 2014-08-25: 10 mL

## 2014-08-25 MED ORDER — BUPIVACAINE LIPOSOME 1.3 % IJ SUSP
INTRAMUSCULAR | Status: DC | PRN
  Administered 2014-08-25: 20 mL

## 2014-08-25 MED ORDER — HYDROCHLOROTHIAZIDE 25 MG PO TABS
25.0000 mg | ORAL_TABLET | Freq: Every day | ORAL | Status: DC
Start: 2014-08-25 — End: 2014-08-26
  Administered 2014-08-25 – 2014-08-26 (×2): 25 mg via ORAL
  Filled 2014-08-25 (×2): qty 1

## 2014-08-25 MED ORDER — ONDANSETRON HCL 4 MG/2ML IJ SOLN: 4.0000 mg | Freq: Four times a day (QID) | INTRAMUSCULAR | Status: DC | PRN

## 2014-08-25 MED ORDER — HYDROMORPHONE HCL 1 MG/ML IJ SOLN
0.5000 mg | INTRAMUSCULAR | Status: DC | PRN
  Administered 2014-08-25 – 2014-08-26 (×2): 1 mg via INTRAVENOUS
  Filled 2014-08-25 (×2): qty 1

## 2014-08-25 MED ORDER — OXYCODONE HCL 5 MG PO TABS
ORAL_TABLET | ORAL | Status: AC
  Administered 2014-08-25: 10 mg via ORAL
  Filled 2014-08-25: qty 2

## 2014-08-25 MED ORDER — METHOCARBAMOL 1000 MG/10ML IJ SOLN: 500.0000 mg | INTRAMUSCULAR | Status: DC

## 2014-08-25 MED ORDER — ZOLPIDEM TARTRATE 5 MG PO TABS: 5.0000 mg | ORAL_TABLET | Freq: Every evening | ORAL | Status: DC | PRN

## 2014-08-25 MED ORDER — METOCLOPRAMIDE HCL 5 MG PO TABS: 5.0000 mg | ORAL_TABLET | Freq: Three times a day (TID) | ORAL | Status: DC | PRN

## 2014-08-25 MED ORDER — HYDROMORPHONE HCL 1 MG/ML IJ SOLN
INTRAMUSCULAR | Status: AC
  Administered 2014-08-25: 0.5 mg via INTRAVENOUS
  Filled 2014-08-25: qty 1

## 2014-08-25 MED ORDER — HYDROMORPHONE HCL 1 MG/ML IJ SOLN
0.2500 mg | INTRAMUSCULAR | Status: DC | PRN
  Administered 2014-08-25 (×6): 0.5 mg via INTRAVENOUS

## 2014-08-25 MED ORDER — METHOCARBAMOL 500 MG PO TABS
500.0000 mg | ORAL_TABLET | Freq: Four times a day (QID) | ORAL | Status: DC | PRN
  Administered 2014-08-25 – 2014-08-26 (×3): 500 mg via ORAL
  Filled 2014-08-25 (×3): qty 1

## 2014-08-25 MED ORDER — PROPOFOL INFUSION 10 MG/ML OPTIME
INTRAVENOUS | Status: DC | PRN
  Administered 2014-08-25: 75 ug/kg/min via INTRAVENOUS

## 2014-08-25 MED ORDER — BISACODYL 5 MG PO TBEC: 5.0000 mg | DELAYED_RELEASE_TABLET | Freq: Every day | ORAL | Status: DC | PRN

## 2014-08-25 MED ORDER — ONDANSETRON HCL 4 MG/2ML IJ SOLN
INTRAMUSCULAR | Status: DC | PRN
  Administered 2014-08-25: 4 mg via INTRAVENOUS

## 2014-08-25 MED ORDER — SIMVASTATIN 40 MG PO TABS
40.0000 mg | ORAL_TABLET | Freq: Every day | ORAL | Status: DC
  Administered 2014-08-25: 40 mg via ORAL
  Filled 2014-08-25: qty 1

## 2014-08-25 MED ORDER — PANTOPRAZOLE SODIUM 40 MG PO TBEC
40.0000 mg | DELAYED_RELEASE_TABLET | Freq: Every day | ORAL | Status: DC
  Administered 2014-08-25 – 2014-08-26 (×2): 40 mg via ORAL
  Filled 2014-08-25 (×2): qty 1

## 2014-08-25 MED ORDER — DOCUSATE SODIUM 100 MG PO CAPS
100.0000 mg | ORAL_CAPSULE | Freq: Two times a day (BID) | ORAL | Status: DC
  Administered 2014-08-25 – 2014-08-26 (×2): 100 mg via ORAL
  Filled 2014-08-25 (×2): qty 1

## 2014-08-25 MED ORDER — ONDANSETRON HCL 4 MG/2ML IJ SOLN
INTRAMUSCULAR | Status: AC
  Filled 2014-08-25: qty 2

## 2014-08-25 MED ORDER — BUPIVACAINE LIPOSOME 1.3 % IJ SUSP
20.0000 mL | INTRAMUSCULAR | Status: DC
  Filled 2014-08-25: qty 20

## 2014-08-25 MED ORDER — LOSARTAN POTASSIUM 50 MG PO TABS
100.0000 mg | ORAL_TABLET | Freq: Every day | ORAL | Status: DC
  Administered 2014-08-25 – 2014-08-26 (×2): 100 mg via ORAL
  Filled 2014-08-25 (×2): qty 2

## 2014-08-25 MED ORDER — CELECOXIB 200 MG PO CAPS
200.0000 mg | ORAL_CAPSULE | Freq: Two times a day (BID) | ORAL | Status: DC
  Administered 2014-08-25 – 2014-08-26 (×2): 200 mg via ORAL
  Filled 2014-08-25 (×2): qty 1

## 2014-08-25 MED ORDER — HYDROMORPHONE HCL 1 MG/ML IJ SOLN
INTRAMUSCULAR | Status: AC
  Filled 2014-08-25: qty 1

## 2014-08-25 MED ORDER — ACETAMINOPHEN 650 MG RE SUPP: 650.0000 mg | Freq: Four times a day (QID) | RECTAL | Status: DC | PRN

## 2014-08-25 MED ORDER — SODIUM CHLORIDE 0.9 % IR SOLN
Status: DC | PRN
  Administered 2014-08-25 (×3): 1000 mL

## 2014-08-25 MED ORDER — DEXAMETHASONE SODIUM PHOSPHATE 10 MG/ML IJ SOLN
10.0000 mg | Freq: Once | INTRAMUSCULAR | Status: AC
  Administered 2014-08-26: 10 mg via INTRAVENOUS
  Filled 2014-08-25: qty 1

## 2014-08-25 MED ORDER — DIPHENHYDRAMINE HCL 12.5 MG/5ML PO ELIX: 12.5000 mg | ORAL_SOLUTION | ORAL | Status: DC | PRN

## 2014-08-25 MED ORDER — OXYCODONE-ACETAMINOPHEN 5-325 MG PO TABS: 1.0000 | ORAL_TABLET | ORAL | Status: DC | PRN

## 2014-08-25 MED ORDER — OXYCODONE HCL 5 MG PO TABS
5.0000 mg | ORAL_TABLET | ORAL | Status: DC | PRN
  Administered 2014-08-25 – 2014-08-26 (×7): 10 mg via ORAL
  Filled 2014-08-25 (×5): qty 2

## 2014-08-25 MED ORDER — MIDAZOLAM HCL 5 MG/5ML IJ SOLN
INTRAMUSCULAR | Status: DC | PRN
  Administered 2014-08-25: 2 mg via INTRAVENOUS

## 2014-08-25 MED ORDER — METHOCARBAMOL 1000 MG/10ML IJ SOLN
500.0000 mg | Freq: Four times a day (QID) | INTRAVENOUS | Status: DC | PRN
  Filled 2014-08-25: qty 5

## 2014-08-25 MED ORDER — SODIUM CHLORIDE 0.9 % IR SOLN
Status: DC | PRN
  Administered 2014-08-25: 1000 mL

## 2014-08-25 MED ORDER — OXYCODONE HCL 5 MG/5ML PO SOLN: 5.0000 mg | Freq: Once | ORAL | Status: DC | PRN

## 2014-08-25 MED ORDER — ONDANSETRON HCL 4 MG PO TABS: 4.0000 mg | ORAL_TABLET | Freq: Four times a day (QID) | ORAL | Status: DC | PRN

## 2014-08-25 MED ORDER — SENNOSIDES-DOCUSATE SODIUM 8.6-50 MG PO TABS: 1.0000 | ORAL_TABLET | Freq: Every evening | ORAL | Status: DC | PRN

## 2014-08-25 MED ORDER — METHOCARBAMOL 1000 MG/10ML IJ SOLN
500.0000 mg | INTRAVENOUS | Status: AC
  Administered 2014-08-25: 500 mg via INTRAVENOUS
  Filled 2014-08-25: qty 5

## 2014-08-25 MED ORDER — APIXABAN 2.5 MG PO TABS
2.5000 mg | ORAL_TABLET | Freq: Two times a day (BID) | ORAL | Status: DC
  Administered 2014-08-26: 2.5 mg via ORAL
  Filled 2014-08-25: qty 1

## 2014-08-25 MED ORDER — CEFAZOLIN SODIUM-DEXTROSE 2-3 GM-% IV SOLR
2.0000 g | Freq: Four times a day (QID) | INTRAVENOUS | Status: AC
  Administered 2014-08-25 – 2014-08-26 (×2): 2 g via INTRAVENOUS
  Filled 2014-08-25 (×2): qty 50

## 2014-08-25 MED ORDER — ACETAMINOPHEN 325 MG PO TABS
ORAL_TABLET | ORAL | Status: AC
  Administered 2014-08-25: 650 mg via ORAL
  Filled 2014-08-25: qty 2

## 2014-08-25 MED ORDER — FLEET ENEMA 7-19 GM/118ML RE ENEM: 1.0000 | ENEMA | Freq: Once | RECTAL | Status: AC | PRN

## 2014-08-25 MED ORDER — METOCLOPRAMIDE HCL 5 MG/ML IJ SOLN: 5.0000 mg | Freq: Three times a day (TID) | INTRAMUSCULAR | Status: DC | PRN

## 2014-08-25 MED ORDER — ONDANSETRON HCL 4 MG PO TABS: 4.0000 mg | ORAL_TABLET | Freq: Three times a day (TID) | ORAL | Status: DC | PRN

## 2014-08-25 MED ORDER — ACETAMINOPHEN 325 MG PO TABS
650.0000 mg | ORAL_TABLET | Freq: Four times a day (QID) | ORAL | Status: DC | PRN
  Administered 2014-08-25: 650 mg via ORAL

## 2014-08-25 SURGICAL SUPPLY — 69 items
BANDAGE ELASTIC 4 VELCRO ST LF (GAUZE/BANDAGES/DRESSINGS) ×3 IMPLANT
BANDAGE ELASTIC 6 VELCRO ST LF (GAUZE/BANDAGES/DRESSINGS) ×3 IMPLANT
BANDAGE ESMARK 6X9 LF (GAUZE/BANDAGES/DRESSINGS) ×1 IMPLANT
BENZOIN TINCTURE PRP APPL 2/3 (GAUZE/BANDAGES/DRESSINGS) ×3 IMPLANT
BLADE SAG 18X100X1.27 (BLADE) ×6 IMPLANT
BNDG ESMARK 6X9 LF (GAUZE/BANDAGES/DRESSINGS) ×3
BOWL SMART MIX CTS (DISPOSABLE) ×3 IMPLANT
CAP KNEE TOTAL 3 SIGMA ×3 IMPLANT
CEMENT BONE SIMPLEX SPEEDSET (Cement) ×6 IMPLANT
CLOSURE WOUND 1/2 X4 (GAUZE/BANDAGES/DRESSINGS) ×1
COVER SURGICAL LIGHT HANDLE (MISCELLANEOUS) ×3 IMPLANT
CUFF TOURNIQUET SINGLE 34IN LL (TOURNIQUET CUFF) ×3 IMPLANT
DRAPE EXTREMITY T 121X128X90 (DRAPE) ×3 IMPLANT
DRAPE IMP U-DRAPE 54X76 (DRAPES) ×3 IMPLANT
DRAPE PROXIMA HALF (DRAPES) ×3 IMPLANT
DRAPE U-SHAPE 47X51 STRL (DRAPES) ×3 IMPLANT
DRSG PAD ABDOMINAL 8X10 ST (GAUZE/BANDAGES/DRESSINGS) ×3 IMPLANT
DURAPREP 26ML APPLICATOR (WOUND CARE) ×6 IMPLANT
ELECT CAUTERY BLADE 6.4 (BLADE) ×3 IMPLANT
ELECT REM PT RETURN 9FT ADLT (ELECTROSURGICAL) ×3
ELECTRODE REM PT RTRN 9FT ADLT (ELECTROSURGICAL) ×1 IMPLANT
EVACUATOR 1/8 PVC DRAIN (DRAIN) ×3 IMPLANT
FACESHIELD WRAPAROUND (MASK) ×6 IMPLANT
GAUZE SPONGE 4X4 12PLY STRL (GAUZE/BANDAGES/DRESSINGS) ×3 IMPLANT
GLOVE BIOGEL PI IND STRL 6.5 (GLOVE) ×2 IMPLANT
GLOVE BIOGEL PI IND STRL 7.0 (GLOVE) ×1 IMPLANT
GLOVE BIOGEL PI IND STRL 7.5 (GLOVE) ×1 IMPLANT
GLOVE BIOGEL PI INDICATOR 6.5 (GLOVE) ×4
GLOVE BIOGEL PI INDICATOR 7.0 (GLOVE) ×2
GLOVE BIOGEL PI INDICATOR 7.5 (GLOVE) ×2
GLOVE ECLIPSE 7.0 STRL STRAW (GLOVE) ×3 IMPLANT
GLOVE ORTHO TXT STRL SZ7.5 (GLOVE) ×3 IMPLANT
GLOVE SURG SS PI 6.0 STRL IVOR (GLOVE) ×3 IMPLANT
GLOVE SURG SS PI 7.0 STRL IVOR (GLOVE) ×3 IMPLANT
GOWN STRL REUS W/ TWL LRG LVL3 (GOWN DISPOSABLE) ×3 IMPLANT
GOWN STRL REUS W/ TWL XL LVL3 (GOWN DISPOSABLE) ×1 IMPLANT
GOWN STRL REUS W/TWL LRG LVL3 (GOWN DISPOSABLE) ×6
GOWN STRL REUS W/TWL XL LVL3 (GOWN DISPOSABLE) ×2
HANDPIECE INTERPULSE COAX TIP (DISPOSABLE) ×2
IMMOBILIZER KNEE 22 UNIV (SOFTGOODS) ×3 IMPLANT
IMMOBILIZER KNEE 24 THIGH 36 (MISCELLANEOUS) IMPLANT
IMMOBILIZER KNEE 24 UNIV (MISCELLANEOUS)
KIT BASIN OR (CUSTOM PROCEDURE TRAY) ×3 IMPLANT
KIT ROOM TURNOVER OR (KITS) ×3 IMPLANT
MANIFOLD NEPTUNE II (INSTRUMENTS) ×3 IMPLANT
NEEDLE 18GX1X1/2 (RX/OR ONLY) (NEEDLE) ×3 IMPLANT
NEEDLE HYPO 25GX1X1/2 BEV (NEEDLE) ×3 IMPLANT
NS IRRIG 1000ML POUR BTL (IV SOLUTION) ×3 IMPLANT
PACK TOTAL JOINT (CUSTOM PROCEDURE TRAY) ×3 IMPLANT
PACK UNIVERSAL I (CUSTOM PROCEDURE TRAY) ×3 IMPLANT
PAD ARMBOARD 7.5X6 YLW CONV (MISCELLANEOUS) ×6 IMPLANT
PAD CAST 4YDX4 CTTN HI CHSV (CAST SUPPLIES) ×1 IMPLANT
PADDING CAST COTTON 4X4 STRL (CAST SUPPLIES) ×2
PADDING CAST COTTON 6X4 STRL (CAST SUPPLIES) IMPLANT
SET HNDPC FAN SPRY TIP SCT (DISPOSABLE) ×1 IMPLANT
STRIP CLOSURE SKIN 1/2X4 (GAUZE/BANDAGES/DRESSINGS) ×2 IMPLANT
SUCTION FRAZIER TIP 10 FR DISP (SUCTIONS) ×3 IMPLANT
SUT MNCRL AB 4-0 PS2 18 (SUTURE) ×3 IMPLANT
SUT VIC AB 0 CT1 27 (SUTURE) ×2
SUT VIC AB 0 CT1 27XBRD ANBCTR (SUTURE) ×1 IMPLANT
SUT VIC AB 1 CT1 27 (SUTURE) ×4
SUT VIC AB 1 CT1 27XBRD ANBCTR (SUTURE) ×2 IMPLANT
SUT VIC AB 2-0 CT1 27 (SUTURE) ×4
SUT VIC AB 2-0 CT1 TAPERPNT 27 (SUTURE) ×2 IMPLANT
SYR 50ML LL SCALE MARK (SYRINGE) ×3 IMPLANT
SYR CONTROL 10ML LL (SYRINGE) ×3 IMPLANT
TOWEL OR 17X24 6PK STRL BLUE (TOWEL DISPOSABLE) ×3 IMPLANT
TOWEL OR 17X26 10 PK STRL BLUE (TOWEL DISPOSABLE) ×3 IMPLANT
TRAY CATH 16FR W/PLASTIC CATH (SET/KITS/TRAYS/PACK) ×3 IMPLANT

## 2014-08-25 NOTE — Transfer of Care (Signed)
Immediate Anesthesia Transfer of Care Note  Patient: Scott Roth  Procedure(s) Performed: Procedure(s): RIGHT TOTAL KNEE ARTHROPLASTY (Right)  Patient Location: PACU  Anesthesia Type:MAC and Spinal  Level of Consciousness: awake, alert , oriented and patient cooperative  Airway & Oxygen Therapy: Patient Spontanous Breathing and Patient connected to face mask oxygen  Post-op Assessment: Report given to RN and Post -op Vital signs reviewed and stable  Post vital signs: Reviewed and stable  Last Vitals:  Filed Vitals:   08/25/14 0953  BP: 143/93  Pulse:   Temp:   Resp:     Complications: No apparent anesthesia complications

## 2014-08-25 NOTE — Progress Notes (Signed)
Report given to robin roberts rn as caregiven

## 2014-08-25 NOTE — Anesthesia Procedure Notes (Addendum)
Spinal Patient location during procedure: OR Start time: 08/25/2014 10:58 AM End time: 08/25/2014 11:03 AM Staffing Anesthesiologist: HODIERNE, ADAM Performed by: anesthesiologist  Preanesthetic Checklist Completed: patient identified, site marked, surgical consent, pre-op evaluation, timeout performed, IV checked, risks and benefits discussed and monitors and equipment checked Spinal Block Patient position: sitting Prep: Betadine Patient monitoring: heart rate, cardiac monitor, continuous pulse ox and blood pressure Approach: midline Location: L3-4 Injection technique: single-shot Needle Needle type: Pencan  Needle length: 10 cm Assessment Sensory level: T6 Additional Notes Pt tolerated the procedure well.  Procedure Name: MAC Date/Time: 08/25/2014 11:03 AM Performed by: Dione Booze Pre-anesthesia Checklist: Patient identified, Emergency Drugs available, Suction available and Patient being monitored Patient Re-evaluated:Patient Re-evaluated prior to inductionOxygen Delivery Method: Simple face mask Placement Confirmation: positive ETCO2

## 2014-08-25 NOTE — Anesthesia Preprocedure Evaluation (Signed)
Anesthesia Evaluation  Patient identified by MRN, date of birth, ID band Patient awake    Reviewed: Allergy & Precautions, NPO status , Patient's Chart, lab work & pertinent test results  Airway Mallampati: II   Neck ROM: full    Dental   Pulmonary sleep apnea ,  breath sounds clear to auscultation        Cardiovascular hypertension, + CAD and + CABG Rhythm:regular Rate:Normal     Neuro/Psych    GI/Hepatic GERD-  ,  Endo/Other  obese  Renal/GU      Musculoskeletal  (+) Arthritis -,   Abdominal   Peds  Hematology   Anesthesia Other Findings   Reproductive/Obstetrics                             Anesthesia Physical Anesthesia Plan  ASA: III  Anesthesia Plan: Spinal   Post-op Pain Management:    Induction: Intravenous  Airway Management Planned: Simple Face Mask  Additional Equipment:   Intra-op Plan:   Post-operative Plan:   Informed Consent: I have reviewed the patients History and Physical, chart, labs and discussed the procedure including the risks, benefits and alternatives for the proposed anesthesia with the patient or authorized representative who has indicated his/her understanding and acceptance.     Plan Discussed with: CRNA, Anesthesiologist and Surgeon  Anesthesia Plan Comments:         Anesthesia Quick Evaluation

## 2014-08-25 NOTE — Progress Notes (Signed)
Orthopedic Tech Progress Note Patient Details:  Scott Roth 1955/09/08 [id number]  CPM Right Knee CPM Right Knee: On Right Knee Flexion (Degrees): 90 Right Knee Extension (Degrees): 0  Ortho Devices Ortho Device/Splint Location: applied ohf and footsie roll Ortho Device/Splint Interventions: Ordered, Application   Braulio Bosch 08/25/2014, 4:14 PM

## 2014-08-25 NOTE — Anesthesia Postprocedure Evaluation (Signed)
  Anesthesia Post-op Note  Patient: Scott Roth  Procedure(s) Performed: Procedure(s): RIGHT TOTAL KNEE ARTHROPLASTY (Right)  Patient Location: PACU  Anesthesia Type:Spinal  Level of Consciousness: awake, alert  and oriented  Airway and Oxygen Therapy: Patient Spontanous Breathing  Post-op Pain: none  Post-op Assessment: Post-op Vital signs reviewed, Patient's Cardiovascular Status Stable and Respiratory Function Stable  Post-op Vital Signs: Reviewed and stable  Last Vitals:  Filed Vitals:   08/25/14 1500  BP: 134/89  Pulse: 65  Temp:   Resp: 12    Complications: No apparent anesthesia complications

## 2014-08-25 NOTE — Interval H&P Note (Signed)
History and Physical Interval Note:  08/25/2014 8:34 AM  Scott Roth  has presented today for surgery, with the diagnosis of DJD RIGHT KNEE  The various methods of treatment have been discussed with the patient and family. After consideration of risks, benefits and other options for treatment, the patient has consented to  Procedure(s): RIGHT TOTAL KNEE ARTHROPLASTY (Right) as a surgical intervention .  The patient's history has been reviewed, patient examined, no change in status, stable for surgery.  I have reviewed the patient's chart and labs.  Questions were answered to the patient's satisfaction.     [name] F

## 2014-08-25 NOTE — Discharge Summary (Signed)
Patient ID: Scott Roth MRN: [id number] DOB/AGE: [birth year] 59 y.o.  Admit date: 08/25/2014 Discharge date: 08/25/2014  Admission Diagnoses:  Active Problems:   DJD (degenerative joint disease) of knee   Discharge Diagnoses:  Same  Past Medical History  Diagnosis Date  . CAD (coronary artery disease)   . Hypertension   . Hyperlipidemia   . Erectile dysfunction   . Tubular adenoma   . Sleep apnea     does not wear mask  . GERD (gastroesophageal reflux disease)   . Arthritis     Surgeries: Procedure(s): RIGHT TOTAL KNEE ARTHROPLASTY on 08/25/2014   Consultants:    Discharged Condition: Improved  Hospital Course: Scott Roth is an 59 y.o. male who was admitted 08/25/2014 for operative treatment of primary localized osteoarthritis right knee. Patient has severe unremitting pain that affects sleep, daily activities, and work/hobbies. After pre-op clearance the patient was taken to the operating room on 08/25/2014 and underwent  Procedure(s): RIGHT TOTAL KNEE ARTHROPLASTY.    Patient was given perioperative antibiotics: Anti-infectives    Start     Dose/Rate Route Frequency Ordered Stop   08/25/14 1000  ceFAZolin (ANCEF) IVPB 2 g/50 mL premix     2 g 100 mL/hr over 30 Minutes Intravenous To Surgery 08/24/14 1352 08/25/14 1100       Patient was given sequential compression devices, early ambulation, and chemoprophylaxis to prevent DVT.  Patient benefited maximally from hospital stay and there were no complications.    Recent vital signs: Patient Vitals for the past 24 hrs:  BP Temp Temp src Pulse Resp SpO2 Weight  08/25/14 0953 (!) 143/93 mmHg - - - - - -  08/25/14 0938 (!) 165/101 mmHg 97.6 F (36.4 C) Oral 71 20 99 % 93.441 kg (206 lb)     Recent laboratory studies: No results for input(s): WBC, HGB, HCT, PLT, NA, K, CL, CO2, BUN, CREATININE, GLUCOSE, INR, CALCIUM in the last 72 hours.  Invalid input(s): PT, 2   Discharge Medications:     Medication  List    STOP taking these medications        ibuprofen 200 MG tablet  Commonly known as:  ADVIL,MOTRIN      TAKE these medications        apixaban 2.5 MG Tabs tablet  Commonly known as:  ELIQUIS  Take 1 tablet po q12 hours x 12 days following surgery to prevent blood clots     B-12 PO  Take 1 tablet by mouth daily.     B-6 PO  Take by mouth.     bisacodyl 5 MG EC tablet  Commonly known as:  DULCOLAX  Take 1 tablet (5 mg total) by mouth daily as needed for moderate constipation.     Co Q-10 300 MG Caps  Take 1 capsule by mouth daily.     esomeprazole 20 MG capsule  Commonly known as:  NEXIUM  Take 20 mg by mouth daily as needed.     hydrochlorothiazide 25 MG tablet  Commonly known as:  HYDRODIURIL  Take 1 tablet (25 mg total) by mouth daily.     loratadine-pseudoephedrine 10-240 MG per 24 hr tablet  Commonly known as:  CLARITIN-D 24-hour  Take 1 tablet by mouth daily as needed for allergies.     losartan 100 MG tablet  Commonly known as:  COZAAR  Take 1 tablet (100 mg total) by mouth daily.     methocarbamol 500 MG tablet  Commonly known as:  ROBAXIN  Take 1 tablet (500 mg total) by mouth 4 (four) times daily.     MULTIVITAMIN PO  Take 1 tablet by mouth daily.     ondansetron 4 MG tablet  Commonly known as:  ZOFRAN  Take 1 tablet (4 mg total) by mouth every 8 (eight) hours as needed for nausea or vomiting.     oxyCODONE-acetaminophen 5-325 MG per tablet  Commonly known as:  ROXICET  Take 1-2 tablets by mouth every 4 (four) hours as needed.     simvastatin 40 MG tablet  Commonly known as:  ZOCOR  Take 1 tablet (40 mg total) by mouth at bedtime.     vitamin C 500 MG tablet  Commonly known as:  ASCORBIC ACID  Take 500 mg by mouth daily.     VITAMIN E PO  Take 1 tablet by mouth daily.        Diagnostic Studies: No results found.  Disposition:         Follow-up Information    Follow up with [facility] F, MD. Schedule an appointment as soon  as possible for a visit in 2 weeks.   Specialty:  Orthopedic Surgery   Contact information:   [place] [address] [phone]        Signed: Fannie Knee 08/25/2014, 12:45 PM

## 2014-08-25 NOTE — H&P (View-Only) (Signed)
TOTAL KNEE ADMISSION H&P  Patient is being admitted for right total knee arthroplasty.  Subjective:  Chief Complaint:right knee pain.  HPI: RAMAL ECKHARDT, 59 y.o. male, has a history of pain and functional disability in the right knee due to arthritis and has failed non-surgical conservative treatments for greater than 12 weeks to includeNSAID's and/or analgesics, corticosteriod injections and activity modification.  Onset of symptoms was gradual, starting 7 years ago with gradually worsening course since that time. The patient noted no past surgery on the right knee(s).  Patient currently rates pain in the right knee(s) at 5 out of 10 with activity. Patient has worsening of pain with activity and weight bearing.  Patient has evidence of subchondral sclerosis by imaging studies. There is no active infection.  Patient Active Problem List   Diagnosis Date Noted  . Nocturia 08/31/2013  . Sleep apnea 08/31/2013  . Hypertension   . Hyperlipidemia   . CAD (coronary artery disease)    Past Medical History  Diagnosis Date  . CAD (coronary artery disease)   . Hypertension   . Hyperlipidemia   . Erectile dysfunction   . Tubular adenoma     No past surgical history on file.   (Not in a hospital admission) Allergies  Allergen Reactions  . Penicillins     History  Substance Use Topics  . Smoking status: Never Smoker   . Smokeless tobacco: Not on file  . Alcohol Use: No    Family History  Problem Relation Age of Onset  . Heart failure Father      Review of Systems  Constitutional: Negative.   HENT: Negative.   Eyes: Negative.   Respiratory: Negative.   Cardiovascular: Negative.   Gastrointestinal: Negative.   Genitourinary: Negative.   Musculoskeletal: Positive for joint pain.  Skin: Negative.   Neurological: Negative.   Endo/Heme/Allergies: Negative.   Psychiatric/Behavioral: Negative.     Objective:  Physical Exam  Constitutional: He is oriented to person,  place, and time. He appears well-developed and well-nourished.  HENT:  Head: Normocephalic and atraumatic.  Eyes: EOM are normal. Pupils are equal, round, and reactive to light.  Neck: Normal range of motion. Neck supple.  Cardiovascular: Normal rate and regular rhythm.  Exam reveals no gallop and no friction rub.   No murmur heard. Respiratory: Effort normal and breath sounds normal. No respiratory distress. He has no wheezes. He has no rales.  GI: Soft. Bowel sounds are normal.  Musculoskeletal:  On the right he has 5 degrees fixed varus.  Motion 3 to about 110.  Tibiofemoral and patellofemoral crepitus.  Neurovascularly intact distally  Neurological: He is alert and oriented to person, place, and time.  Skin: Skin is warm and dry.  Psychiatric: He has a normal mood and affect. His behavior is normal. Judgment and thought content normal.    Vital signs in last 24 hours: @VSRANGES @  Labs:   Estimated body mass index is 32.32 kg/(m^2) as calculated from the following:   Height as of 08/31/13: 5\' 7"  (1.702 m).   Weight as of 08/31/13: 93.622 kg (206 lb 6.4 oz).   Imaging Review Plain radiographs demonstrate severe degenerative joint disease of the right knee(s). The overall alignment ismild varus. The bone quality appears to be fair for age and reported activity level.  Assessment/Plan:  End stage arthritis, right knee   The patient history, physical examination, clinical judgment of the provider and imaging studies are consistent with end stage degenerative joint disease of the right  knee(s) and total knee arthroplasty is deemed medically necessary. The treatment options including medical management, injection therapy arthroscopy and arthroplasty were discussed at length. The risks and benefits of total knee arthroplasty were presented and reviewed. The risks due to aseptic loosening, infection, stiffness, patella tracking problems, thromboembolic complications and other imponderables  were discussed. The patient acknowledged the explanation, agreed to proceed with the plan and consent was signed. Patient is being admitted for inpatient treatment for surgery, pain control, PT, OT, prophylactic antibiotics, VTE prophylaxis, progressive ambulation and ADL's and discharge planning. The patient is planning to be discharged home with home health services

## 2014-08-26 ENCOUNTER — Encounter (HOSPITAL_COMMUNITY): Payer: Self-pay | Admitting: General Practice

## 2014-08-26 LAB — CBC
HEMATOCRIT: 36.8 % — AB (ref 39.0–52.0)
Hemoglobin: 13.1 g/dL (ref 13.0–17.0)
MCH: 32.6 pg (ref 26.0–34.0)
MCHC: 35.6 g/dL (ref 30.0–36.0)
MCV: 91.5 fL (ref 78.0–100.0)
Platelets: 248 10*3/uL (ref 150–400)
RBC: 4.02 MIL/uL — ABNORMAL LOW (ref 4.22–5.81)
RDW: 13.5 % (ref 11.5–15.5)
WBC: 10.2 10*3/uL (ref 4.0–10.5)

## 2014-08-26 LAB — BASIC METABOLIC PANEL
ANION GAP: 8 (ref 5–15)
BUN: 9 mg/dL (ref 6–20)
CHLORIDE: 99 mmol/L — AB (ref 101–111)
CO2: 27 mmol/L (ref 22–32)
Calcium: 8.4 mg/dL — ABNORMAL LOW (ref 8.9–10.3)
Creatinine, Ser: 0.84 mg/dL (ref 0.61–1.24)
GFR calc Af Amer: >60 mL/min (ref >60)
GFR calc non Af Amer: >60 mL/min (ref >60)
Glucose, Bld: 141 mg/dL — ABNORMAL HIGH (ref 65–99)
POTASSIUM: 4.3 mmol/L (ref 3.5–5.1)
SODIUM: 134 mmol/L — AB (ref 135–145)

## 2014-08-26 MED ORDER — ALUM & MAG HYDROXIDE-SIMETH 200-200-20 MG/5ML PO SUSP: 30.0000 mL | Freq: Four times a day (QID) | ORAL | Status: DC | PRN

## 2014-08-26 NOTE — Progress Notes (Signed)
Patient provided with discharge instructions and follow up information. He is going home at this time with HHPT set up through [facility]. He is going home at this time with spouse

## 2014-08-26 NOTE — Progress Notes (Signed)
OT Cancellation Note  Patient Details Name: Scott Roth MRN: [id number] DOB: April 14, 1956   Cancelled Treatment:    Reason Eval/Treat Not Completed: OT screened, no needs identified, will sign off. OT role explained . Pt does not feel OT needed.    Pt did share with OT and states RN aware after last dose of pain meds he became sweaty, felt like tongue and hands did not work, as well as felt nauseous.  OT provided pt crackers and drink.  Betsy Pries 08/26/2014, 1:36 PM

## 2014-08-26 NOTE — Progress Notes (Signed)
Subjective: 1 Day Post-Op Procedure(s) (LRB): RIGHT TOTAL KNEE ARTHROPLASTY (Right) Patient reports pain as mild.  No nausea/vomiting, lightheadedness/dizziness.  Negative flatus/bm.  Decreased appetite.  Objective: Vital signs in last 24 hours: Temp:  [97.4 F (36.3 C)-97.7 F (36.5 C)] 97.6 F (36.4 C) (05/12 0504) Pulse Rate:  [59-77] 77 (05/12 0504) Resp:  [12-21] 16 (05/12 0504) BP: (121-165)/(79-108) 134/90 mmHg (05/12 0504) SpO2:  [96 %-100 %] 97 % (05/12 0504) Weight:  [93.441 kg (206 lb)] 93.441 kg (206 lb) (05/11 0918)  Intake/Output from previous day: 05/11 0701 - 05/12 0700 In: 1700 [I.V.:1700] Out: 1565 [Urine:1175; Drains:340; Blood:50] Intake/Output this shift: Total I/O In: -  Out: 690 [Urine:600; Drains:90]   Recent Labs  08/26/14 0528  HGB 13.1    Recent Labs  08/26/14 0528  WBC 10.2  RBC 4.02*  HCT 36.8*  PLT 248    Recent Labs  08/26/14 0528  NA 134*  K 4.3  CL 99*  CO2 27  BUN 9  CREATININE 0.84  GLUCOSE 141*  CALCIUM 8.4*   No results for input(s): LABPT, INR in the last 72 hours.  Neurologically intact Neurovascular intact Sensation intact distally Intact pulses distally Dorsiflexion/Plantar flexion intact Compartment soft  hemovac drain pulled by me today Negative homans bilaterally  Assessment/Plan: 1 Day Post-Op Procedure(s) (LRB): RIGHT TOTAL KNEE ARTHROPLASTY (Right) Advance diet Up with therapy D/C IV fluids Discharge home with home health today if he does well with PT WBAT RLE Dry dressing change prn   Fannie Knee 08/26/2014, 6:59 AM

## 2014-08-26 NOTE — Discharge Instructions (Signed)
INSTRUCTIONS AFTER JOINT REPLACEMENT   o Remove items at home which could result in a fall. This includes throw rugs or furniture in walking pathways o ICE to the affected joint every three hours while awake for 30 minutes at a time, for at least the first 3-5 days, and then as needed for pain and swelling.  Continue to use ice for pain and swelling. You may notice swelling that will progress down to the foot and ankle.  This is normal after surgery.  Elevate your leg when you are not up walking on it.   o Continue to use the breathing machine you got in the hospital (incentive spirometer) which will help keep your temperature down.  It is common for your temperature to cycle up and down following surgery, especially at night when you are not up moving around and exerting yourself.  The breathing machine keeps your lungs expanded and your temperature down.  BLOOD THINNER: TAKE ELIQUIS AS DIRECTED FOR A TOTAL OF 12 DAYS FOLLOWING SURGERY TO PREVENT BLOOD CLOTS.  ONCE FINISHED WITH ELIQUIS, TAKE ONE TABLET OF ASPIRIN 325 MG DAILY FOR A TOTAL OF 2 WEEKS.  THEN YOU MAY RESUME YOUR NORMAL ASPIRIN REGIMEN  DIET:  As you were doing prior to hospitalization, we recommend a well-balanced diet.  DRESSING / WOUND CARE / SHOWERING  You may change your dressing 3-5 days after surgery.  Then change the dressing every day with sterile gauze.  Please use good hand washing techniques before changing the dressing.  Do not use any lotions or creams on the incision until instructed by your surgeon. and You may shower 3 days after surgery, but keep the wounds dry during showering.  You may use an occlusive plastic wrap (Press'n Seal for example), NO SOAKING/SUBMERGING IN THE BATHTUB.  If the bandage gets wet, change with a clean dry gauze.  If the incision gets wet, pat the wound dry with a clean towel.  ACTIVITY  o Increase activity slowly as tolerated, but follow the weight bearing instructions below.   o No driving  for 6 weeks or until further direction given by your physician.  You cannot drive while taking narcotics.  o No lifting or carrying greater than 10 lbs. until further directed by your surgeon. o Avoid periods of inactivity such as sitting longer than an hour when not asleep. This helps prevent blood clots.  o You may return to work once you are authorized by your doctor.     WEIGHT BEARING   Weight bearing as tolerated with assist device (walker, cane, etc) as directed, use it as long as suggested by your surgeon or therapist, typically at least 4-6 weeks.   EXERCISES  Results after joint replacement surgery are often greatly improved when you follow the exercise, range of motion and muscle strengthening exercises prescribed by your doctor. Safety measures are also important to protect the joint from further injury. Any time any of these exercises cause you to have increased pain or swelling, decrease what you are doing until you are comfortable again and then slowly increase them. If you have problems or questions, call your caregiver or physical therapist for advice.   Rehabilitation is important following a joint replacement. After just a few days of immobilization, the muscles of the leg can become weakened and shrink (atrophy).  These exercises are designed to build up the tone and strength of the thigh and leg muscles and to improve motion. Often times heat used for twenty to  thirty minutes before working out will loosen up your tissues and help with improving the range of motion but do not use heat for the first two weeks following surgery (sometimes heat can increase post-operative swelling).   These exercises can be done on a training (exercise) mat, on the floor, on a table or on a bed. Use whatever works the best and is most comfortable for you.    Use music or television while you are exercising so that the exercises are a pleasant break in your day. This will make your life better with  the exercises acting as a break in your routine that you can look forward to.   Perform all exercises about fifteen times, three times per day or as directed.  You should exercise both the operative leg and the other leg as well.  Exercises include:    Quad Sets - Tighten up the muscle on the front of the thigh (Quad) and hold for 5-10 seconds.    Straight Leg Raises - With your knee straight (if you were given a brace, keep it on), lift the leg to 60 degrees, hold for 3 seconds, and slowly lower the leg.  Perform this exercise against resistance later as your leg gets stronger.   Leg Slides: Lying on your back, slowly slide your foot toward your buttocks, bending your knee up off the floor (only go as far as is comfortable). Then slowly slide your foot back down until your leg is flat on the floor again.   Angel Wings: Lying on your back spread your legs to the side as far apart as you can without causing discomfort.   Hamstring Strength:  Lying on your back, push your heel against the floor with your leg straight by tightening up the muscles of your buttocks.  Repeat, but this time bend your knee to a comfortable angle, and push your heel against the floor.  You may put a pillow under the heel to make it more comfortable if necessary.   A rehabilitation program following joint replacement surgery can speed recovery and prevent re-injury in the future due to weakened muscles. Contact your doctor or a physical therapist for more information on knee rehabilitation.    CONSTIPATION  Constipation is defined medically as fewer than three stools per week and severe constipation as less than one stool per week.  Even if you have a regular bowel pattern at home, your normal regimen is likely to be disrupted due to multiple reasons following surgery.  Combination of anesthesia, postoperative narcotics, change in appetite and fluid intake all can affect your bowels.   YOU MUST use at least one of the  following options; they are listed in order of increasing strength to get the job done.  They are all available over the counter, and you may need to use some, POSSIBLY even all of these options:    Drink plenty of fluids (prune juice may be helpful) and high fiber foods Colace 100 mg by mouth twice a day  Senokot for constipation as directed and as needed Dulcolax (bisacodyl), take with full glass of water  Miralax (polyethylene glycol) once or twice a day as needed.  If you have tried all these things and are unable to have a bowel movement in the first 3-4 days after surgery call either your surgeon or your primary doctor.    If you experience loose stools or diarrhea, hold the medications until you stool forms back up.  If your  symptoms do not get better within 1 week or if they get worse, check with your doctor.  If you experience "the worst abdominal pain ever" or develop nausea or vomiting, please contact the office immediately for further recommendations for treatment.   ITCHING:  If you experience itching with your medications, try taking only a single pain pill, or even half a pain pill at a time.  You can also use Benadryl over the counter for itching or also to help with sleep.   TED HOSE STOCKINGS:  Use stockings on both legs until for at least 2 weeks or as directed by physician office. They may be removed at night for sleeping.  MEDICATIONS:  See your medication summary on the After Visit Summary that nursing will review with you.  You may have some home medications which will be placed on hold until you complete the course of blood thinner medication.  It is important for you to complete the blood thinner medication as prescribed.  PRECAUTIONS:  If you experience chest pain or shortness of breath - call 911 immediately for transfer to the hospital emergency department.   If you develop a fever greater that 101 F, purulent drainage from wound, increased redness or drainage from  wound, foul odor from the wound/dressing, or calf pain - CONTACT YOUR SURGEON.                                                   FOLLOW-UP APPOINTMENTS:  If you do not already have a post-op appointment, please call the office for an appointment to be seen by your surgeon.  Guidelines for how soon to be seen are listed in your After Visit Summary, but are typically between 1-4 weeks after surgery.  OTHER INSTRUCTIONS:   Knee Replacement:  Do not place pillow under knee, focus on keeping the knee straight while resting. CPM instructions: 0-90 degrees, 2 hours in the morning, 2 hours in the afternoon, and 2 hours in the evening. Place foam block, curve side up under heel at all times except when in CPM or when walking.  DO NOT modify, tear, cut, or change the foam block in any way.  MAKE SURE YOU:   Understand these instructions.   Get help right away if you are not doing well or get worse.    Thank you for letting us be a part of your medical care team.  It is a privilege we respect greatly.  We hope these instructions will help you stay on track for a fast and full recovery!   Information on my medicine - ELIQUIS (apixaban)  This medication education was reviewed with me or my healthcare representative as part of my discharge preparation.  The pharmacist that spoke with me during my hospital stay was:  Lawson Radar, [facility]  Why was Eliquis prescribed for you? Eliquis was prescribed for you to reduce the risk of blood clots forming after orthopedic surgery.    What do You need to know about Eliquis? Take your Eliquis TWICE DAILY - one tablet in the morning and one tablet in the evening with or without food.  It would be best to take the dose about the same time each day.  If you have difficulty swallowing the tablet whole please discuss with your pharmacist how to take the medication safely.  Take  Eliquis exactly as prescribed by your doctor and DO NOT stop taking Eliquis  without talking to the doctor who prescribed the medication.  Stopping without other medication to take the place of Eliquis may increase your risk of developing a clot.  After discharge, you should have regular check-up appointments with your healthcare provider that is prescribing your Eliquis.  What do you do if you miss a dose? If a dose of ELIQUIS is not taken at the scheduled time, take it as soon as possible on the same day and twice-daily administration should be resumed.  The dose should not be doubled to make up for a missed dose.  Do not take more than one tablet of ELIQUIS at the same time.  Important Safety Information A possible side effect of Eliquis is bleeding. You should call your healthcare provider right away if you experience any of the following: ? Bleeding from an injury or your nose that does not stop. ? Unusual colored urine (red or dark brown) or unusual colored stools (red or black). ? Unusual bruising for unknown reasons. ? A serious fall or if you hit your head (even if there is no bleeding).  Some medicines may interact with Eliquis and might increase your risk of bleeding or clotting while on Eliquis. To help avoid this, consult your healthcare provider or pharmacist prior to using any new prescription or non-prescription medications, including herbals, vitamins, non-steroidal anti-inflammatory drugs (NSAIDs) and supplements.  This website has more information on Eliquis (apixaban): http://www.eliquis.com/eliquis/home

## 2014-08-26 NOTE — Progress Notes (Signed)
Physical Therapy Treatment Patient Details Name: MAVERIC Roth MRN: [id number] DOB: [birth year] Today's Date: 08/26/2014    History of Present Illness Pt is a 59 y/o M s/p R TKA.  Pt's PMH includes HTN, and CAD.    PT Comments    Patient is making good progress with PT.  From a mobility standpoint anticipate patient will be ready for DC home today.  Pt demonstrated ability to ambulate 400 ft and ascend/descend 4 steps this session.     Follow Up Recommendations  Home health PT;Supervision for mobility/OOB     Equipment Recommendations  None recommended by PT    Recommendations for Other Services OT consult     Precautions / Restrictions Precautions Precautions: Knee;Fall Precaution Comments: Reviewed no pillow under knee Restrictions Weight Bearing Restrictions: Yes RLE Weight Bearing: Weight bearing as tolerated    Mobility  Bed Mobility               General bed mobility comments: in recliner  Transfers Overall transfer level: Needs assistance Equipment used: Rolling walker (2 wheeled) Transfers: Sit to/from Stand Sit to Stand: Supervision         General transfer comment: Reminder to push from chair rather than pulling on RW.  Supervision for safety.  Ambulation/Gait Ambulation/Gait assistance: Supervision Ambulation Distance (Feet): 400 Feet Assistive device: Rolling walker (2 wheeled) Gait Pattern/deviations: Step-through pattern;Antalgic;Decreased weight shift to right;Decreased stance time - right   Gait velocity interpretation: Below normal speed for age/gender General Gait Details: Min WB through BUEs to offload RLE.  Dec R knee F and E during swing and stance phase respectively.   Stairs Stairs: Yes Stairs assistance: Supervision Stair Management: No rails;Backwards;With walker Number of Stairs: 2 (x2) General stair comments: Verbal cues for RW placement.  Pt ascend/desced 2 step w/ PT and 2 steps w/ assist from fiance.  Wheelchair  Mobility    Modified Rankin (Stroke Patients Only)       Balance Overall balance assessment: Needs assistance Sitting-balance support: No upper extremity supported;Feet supported Sitting balance-Leahy Scale: Good     Standing balance support: Bilateral upper extremity supported;During functional activity Standing balance-Leahy Scale: Fair                      Cognition Arousal/Alertness: Awake/alert Behavior During Therapy: WFL for tasks assessed/performed Overall Cognitive Status: Within Functional Limits for tasks assessed                      Exercises Total Joint Exercises Long Arc Quad: AROM;Right;10 reps;Seated Knee Flexion: AROM;Right;10 reps;Seated Goniometric ROM: 86 deg flexion    General Comments General comments (skin integrity, edema, etc.): Pt denies any dizziness this session.      Pertinent Vitals/Pain Pain Assessment: 0-10 Pain Score: 3  Pain Location: L knee Pain Descriptors / Indicators: Aching Pain Intervention(s): Limited activity within patient's tolerance;Monitored during session;Repositioned    Home Living                      Prior Function            PT Goals (current goals can now be found in the care plan section) Acute Rehab PT Goals Patient Stated Goal: to go home today Progress towards PT goals: Progressing toward goals    Frequency  7X/week    PT Plan Current plan remains appropriate    Co-evaluation             End  of Session Equipment Utilized During Treatment: Gait belt Activity Tolerance: Patient tolerated treatment well Patient left: in chair;with call bell/phone within reach;with family/visitor present     Time: 7867-6720 PT Time Calculation (min) (ACUTE ONLY): 20 min  Charges:  $Gait Training: 8-22 mins                    G Codes:      Joslyn Hy PT, [place] [phone] Pager: [phone] 08/26/2014, 3:41 PM

## 2014-08-26 NOTE — Op Note (Signed)
[name], Scott Roth NO.:  [id number]  MEDICAL RECORD NO.:  [id number]  LOCATION:  5N02C                        FACILITY:  [facility]  PHYSICIAN:  Ninetta Lights, M.D. DATE OF BIRTH:  09-07-1955  DATE OF PROCEDURE:  08/25/2014 DATE OF DISCHARGE:                              OPERATIVE REPORT   PREOPERATIVE DIAGNOSIS:  Right knee end-stage arthritis, varus alignment of flexion contracture.  Primary generalized.  POSTOPERATIVE DIAGNOSIS:  Right knee end-stage arthritis, varus alignment of flexion contracture.  Primary generalized.  PROCEDURE:  Right knee modified minimally invasive total knee replacement with Stryker triathlon prosthesis.  Soft tissue balancing. Cemented pegged posterior stabilized #4 femoral component.  Cemented #5 tibial component, 9-mm insert.  Cemented resurfacing 35-mm patellar component.  Soft tissue balancing.  SURGEON:  Ninetta Lights, MD  ASSISTANT:  Elmyra Ricks, PA, present throughout the entire case and necessary for timely completion of procedure.  ANESTHESIA:  Spinal.  BLOOD LOSS:  Minimal.  SPECIMENS:  None.  CULTURES:  None.  COMPLICATIONS:  None.  DRESSINGS:  Soft compressive knee immobilizer.  TOURNIQUET TIME:  1 hour.  DRAINS:  Hemovac x1.  DESCRIPTION OF PROCEDURE:  The patient was brought to the operating room, placed on the operating table in a supine position.  After adequate anesthesia had been obtained, tourniquet applied, prepped and draped in usual sterile fashion.  Exsanguinated with elevation of Esmarch, tourniquet inflated to 350 mmHg.  Straight incision above the patella down to tibial tubercle.  Medial arthrotomy, vastus splitting. Medial capsule release.  Sorting out with a mild flexion contracture and 5 degrees of varus.  Intramedullary guide, distal femur.  An 8-mm resection 5 degrees of valgus.  Using epicondylar axis, the femur was sized, cut, and fitted for a PEG #4 posterior stabilized  femoral component.  Proximal tibial resection below the defect medially. Extramedullary guide.  Sized to #5 component.  The sclerotic bone on the medial side was treated with multiple drilling.  Patella exposed, posterior 10 mm removed.  Drilled, sized, and fitted for a 35-mm component.  Trials put in place.  With the 9-mm insert, very pleased with balance in flexion, extension, stability, alignment, as well as patellar tracking.  Tibia was marked for rotation and hand ream.  All trials removed.  Copious irrigation with a pulse irrigating device. Cement prepared, placed on all components, firmly seated.  Polyethylene attached to tibia, knee reduced.  Patella held with a clamp.  Once the cement hardened, the knee was irrigated again.  Soft tissue injected with Exparel.  Hemovac placed. Arthrotomy closed with Ethibond with subcutaneous and subcuticular closure.  Margins were injected with Marcaine.  Sterile compressive dressing applied.  Tourniquet deflated and removed.  Knee immobilizer applied.  Anesthesia reversed.  Brought to the recovery room.  Tolerated the surgery well.  No complications.     Ninetta Lights, M.D.     DFM/MEDQ  D:  08/25/2014  T:  08/26/2014  Job:  [id number]

## 2014-08-26 NOTE — Evaluation (Signed)
Physical Therapy Evaluation Patient Details Name: Scott Roth MRN: [id number] DOB: 01/26/1956 Today's Date: 08/26/2014   History of Present Illness  Pt is a 59 y/o M s/p R TKA.  Pt's PMH includes HTN, and CAD.  Clinical Impression  Pt is s/p R TKA resulting in the deficits listed below (see PT Problem List). Pt demonstrated ability to ambulate 90 ft and ascend/descend 2 steps this session. Pt w/ dizziness during session which dissipated after 3 minutes sitting.  Pt will benefit from skilled PT to increase their independence and safety with mobility to allow discharge to the venue listed below.      Follow Up Recommendations Home health PT;Supervision for mobility/OOB    Equipment Recommendations  None recommended by PT    Recommendations for Other Services OT consult     Precautions / Restrictions Precautions Precautions: Knee;Fall Precaution Booklet Issued: Yes (comment) Precaution Comments: Reviewed no pillow under knee Required Braces or Orthoses:  (KI in room, no specific orders) Restrictions Weight Bearing Restrictions: Yes RLE Weight Bearing: Weight bearing as tolerated      Mobility  Bed Mobility Overal bed mobility: Modified Independent             General bed mobility comments: Max use of bed rails, educated on leg hook technique, verbal cues for sequencing  Transfers Overall transfer level: Needs assistance Equipment used: Rolling walker (2 wheeled) Transfers: Sit to/from Stand Sit to Stand: Min guard         General transfer comment: Pt w/ BUEs on RW during sit>stand despite verbal cues to push from bed.   Ambulation/Gait Ambulation/Gait assistance: Min guard Ambulation Distance (Feet): 90 Feet Assistive device: Rolling walker (2 wheeled) Gait Pattern/deviations: Step-to pattern;Decreased stride length;Decreased stance time - right;Decreased weight shift to right;Antalgic;Trunk flexed   Gait velocity interpretation: Below normal speed for  age/gender General Gait Details: Step to, WB through BUEs to dec WB on RLE, this improved w/ verbal cues to WBAT on RLE.  Dec R knee F and E during ambulation.    Stairs Stairs: Yes Stairs assistance: Min guard Stair Management: No rails;Backwards;Step to pattern;With walker Number of Stairs: 2 General stair comments: Verbal cues for proper sequencing and placement of RW w/ ascent/descent.    Wheelchair Mobility    Modified Rankin (Stroke Patients Only)       Balance Overall balance assessment: Needs assistance Sitting-balance support: Feet supported;No upper extremity supported Sitting balance-Leahy Scale: Good     Standing balance support: Bilateral upper extremity supported;During functional activity Standing balance-Leahy Scale: Fair                               Pertinent Vitals/Pain Pain Assessment: 0-10 Pain Score: 5  Pain Location: R Knee Pain Descriptors / Indicators: Aching;Moaning;Discomfort Pain Intervention(s): Limited activity within patient's tolerance;Monitored during session;Repositioned    Home Living Family/patient expects to be discharged to:: Private residence Living Arrangements: Spouse/significant other (fiance) Available Help at Discharge: Friend(s);Family;Available 24 hours/day (niece and boyfriend ) Type of Home: House Home Access: Stairs to enter Entrance Stairs-Rails: None Entrance Stairs-Number of Steps: 4 Home Layout: Two level;Able to live on main level with bedroom/bathroom Home Equipment: Gilford Rile - 2 wheels;Bedside commode      Prior Function Level of Independence: Independent               Hand Dominance   Dominant Hand: Right    Extremity/Trunk Assessment   Upper Extremity Assessment: Defer  to OT evaluation           Lower Extremity Assessment: RLE deficits/detail RLE Deficits / Details: weakness and limited ROM as expected s/p R TKA       Communication   Communication: No difficulties  Cognition  Arousal/Alertness: Awake/alert Behavior During Therapy: WFL for tasks assessed/performed Overall Cognitive Status: Within Functional Limits for tasks assessed                      General Comments General comments (skin integrity, edema, etc.): Pt reports dizziness and overheated after stair training and pt requires sitting rest x1 for 3 minutes before dizziness dissipates.  Pt educated on breathing technique.    Exercises Total Joint Exercises Ankle Circles/Pumps: AROM;Both;15 reps;Supine Quad Sets: AROM;Both;10 reps;Supine Long Arc Quad: AROM;Right;5 reps;Seated Knee Flexion: AROM;Right;5 reps;Seated Goniometric ROM: 2-98      Assessment/Plan    PT Assessment Patient needs continued PT services  PT Diagnosis Difficulty walking;Abnormality of gait;Generalized weakness;Acute pain   PT Problem List Decreased strength;Decreased range of motion;Decreased activity tolerance;Decreased balance;Decreased mobility;Decreased coordination;Decreased knowledge of use of DME;Decreased safety awareness;Decreased knowledge of precautions;Decreased skin integrity;Pain  PT Treatment Interventions Gait training;DME instruction;Stair training;Functional mobility training;Therapeutic activities;Therapeutic exercise;Balance training;Neuromuscular re-education;Patient/family education;Modalities   PT Goals (Current goals can be found in the Care Plan section) Acute Rehab PT Goals Patient Stated Goal: to go home today PT Goal Formulation: With patient Time For Goal Achievement: 09/02/14 Potential to Achieve Goals: Good    Frequency 7X/week   Barriers to discharge Inaccessible home environment 4 steps to enter home    Co-evaluation               End of Session Equipment Utilized During Treatment: Gait belt Activity Tolerance: Patient limited by fatigue;Patient limited by pain Patient left: in chair;with call bell/phone within reach Nurse Communication: Mobility  status;Precautions;Weight bearing status         Time: 1124-1205 PT Time Calculation (min) (ACUTE ONLY): 41 min   Charges:   PT Evaluation $Initial PT Evaluation Tier I: 1 Procedure PT Treatments $Gait Training: 8-22 mins $Therapeutic Exercise: 8-22 mins   PT G Codes:       Joslyn Hy PT, DPT [phone] Pager: [phone] 08/26/2014, 12:12 PM

## 2014-08-26 NOTE — Care Management Note (Signed)
Case Management Note  Patient Details  Name: MICAIAH LITLE MRN: [id number] Date of Birth: Jul 25, 1955  Subjective/Objective:  59 yr old male admitted with DJD of his right knee. Patient underwent a right total knee arthroplasty.          Action/Plan:  Case manager spoke with patient concerning home health and DME needs. Patient was preoperatively setup with [facility], no changes. Patient states he has family support at discharge.     Expected Discharge Date: 08/27/14                  Expected Discharge Plan: Home with Home Health    In-House Referral:     Discharge planning Services  CM Consult  Post Acute Care Choice:  Home Health Choice offered to:     DME Arranged:  3-N-1, CPM, Walker rolling DME Agency:  [employer]  HH Arranged:  PT HH Agency:  [facility]  Status of Service:  Completed, signed off  Medicare Important Message Given:    Date Medicare IM Given:    Medicare IM give by:    Date Additional Medicare IM Given:    Additional Medicare Important Message give by:     If discussed at [place] of Stay Meetings, dates discussed:    Additional Comments:  Ninfa Meeker, RN 08/26/2014, 1:17 PM

## 2014-08-26 NOTE — Plan of Care (Signed)
Problem: Consults Goal: Diagnosis- Total Joint Replacement Outcome: Completed/Met Date Met:  08/26/14 Primary Total Knee Right

## 2014-08-31 ENCOUNTER — Ambulatory Visit (INDEPENDENT_AMBULATORY_CARE_PROVIDER_SITE_OTHER): Payer: BLUE CROSS/BLUE SHIELD | Admitting: Interventional Cardiology

## 2014-08-31 ENCOUNTER — Encounter: Payer: Self-pay | Admitting: Interventional Cardiology

## 2014-08-31 VITALS — BP 108/60 | HR 92 | Ht 67.0 in | Wt 201.4 lb

## 2014-08-31 DIAGNOSIS — I25810 Atherosclerosis of coronary artery bypass graft(s) without angina pectoris: Secondary | ICD-10-CM | POA: Diagnosis not present

## 2014-08-31 DIAGNOSIS — G4730 Sleep apnea, unspecified: Secondary | ICD-10-CM

## 2014-08-31 DIAGNOSIS — I10 Essential (primary) hypertension: Secondary | ICD-10-CM

## 2014-08-31 DIAGNOSIS — E785 Hyperlipidemia, unspecified: Secondary | ICD-10-CM | POA: Diagnosis not present

## 2014-08-31 NOTE — Patient Instructions (Signed)
Medication Instructions:  Your physician recommends that you continue on your current medications as directed. Please refer to the Current Medication list given to you today.   Labwork: None   Testing/Procedures: Your physician has recommended that you have a sleep study. This test records several body functions during sleep, including: brain activity, eye movement, oxygen and carbon dioxide blood levels, heart rate and rhythm, breathing rate and rhythm, the flow of air through your mouth and nose, snoring, body muscle movements, and chest and belly movement.  Follow-Up: Your physician wants you to follow-up in: 1 year with Dr.Smith You will receive a reminder letter in the mail two months in advance. If you don't receive a letter, please call our office to schedule the follow-up appointment.   Any Other Special Instructions Will Be Listed Below (If Applicable).

## 2014-08-31 NOTE — Progress Notes (Signed)
Cardiology Office Note   Date:  08/31/2014   ID:  Scott Roth, DOB 1955-08-06, MRN [id number]  PCP:  Pcp Not In System  Cardiologist:   Sinclair Grooms, MD   Chief Complaint  Patient presents with  . Coronary Artery Disease of Artery bypass graft      History of Present Illness: Scott Roth is a 59 y.o. male who presents for CAD, history of coronary artery bypass grafting 2006, LAD to diagonal stent 0623, chronic diastolic left ventricular dysfunction, and hypertension. Also a history of hyperlipidemia.  Scott Roth had a recent right total knee replacement. There were no cardiopulmonary complications. He has had rare chest discomfort. He is having difficulty sleeping. He has a history of sleep apnea but is never accepted therapy.    Past Medical History  Diagnosis Date  . CAD (coronary artery disease)   . Hypertension   . Hyperlipidemia   . Erectile dysfunction   . Tubular adenoma   . Sleep apnea     does not wear mask  . GERD (gastroesophageal reflux disease)   . Arthritis     Past Surgical History  Procedure Laterality Date  . Knee arthroplasty Left   . Eye surgery Bilateral     Lasik  . Shoulder surgery Right   . Coronary artery bypass graft  2008  . 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 Total knee arthroplasty Right 08/25/2014  . Total knee arthroplasty Right 08/25/2014    Procedure: RIGHT TOTAL KNEE ARTHROPLASTY;  Surgeon: Kathryne Hitch, MD;  Location: [facility];  Service: Orthopedics;  Laterality: Right;     Current Outpatient Prescriptions  Medication Sig Dispense Refill  . apixaban (ELIQUIS) 2.5 MG TABS tablet Take 1 tablet po q12 hours x 12 days following surgery to prevent blood clots 24 tablet 0  . bisacodyl (DULCOLAX) 5 MG EC tablet Take 1 tablet (5 mg total) by mouth daily as needed for moderate constipation. 30 tablet 0  . Coenzyme Q10 (CO Q-10) 300 MG CAPS Take 1 capsule by mouth daily.   0  . Cyanocobalamin (B-12 PO) Take 1 tablet by mouth daily.     Scott Roth esomeprazole (NEXIUM) 20 MG capsule Take 20 mg by mouth daily as needed.     . hydrochlorothiazide (HYDRODIURIL) 25 MG tablet Take 1 tablet (25 mg total) by mouth daily. 30 tablet 9  . loratadine-pseudoephedrine (CLARITIN-D 24-HOUR) 10-240 MG per 24 hr tablet Take 1 tablet by mouth daily as needed for allergies.    Scott Roth losartan (COZAAR) 100 MG tablet Take 1 tablet (100 mg total) by mouth daily. 30 tablet 9  . methocarbamol (ROBAXIN) 500 MG tablet Take 1 tablet (500 mg total) by mouth 4 (four) times daily. 90 tablet 0  . Multiple Vitamins-Minerals (MULTIVITAMIN PO) Take 1 tablet by mouth daily.     . ondansetron (ZOFRAN) 4 MG tablet Take 1 tablet (4 mg total) by mouth every 8 (eight) hours as needed for nausea or vomiting. 40 tablet 0  . oxyCODONE-acetaminophen (ROXICET) 5-325 MG per tablet Take 1-2 tablets by mouth every 4 (four) hours as needed. 60 tablet 0  . Pyridoxine HCl (B-6 PO) Take 1 tablet by mouth daily.     . simvastatin (ZOCOR) 40 MG tablet Take 1 tablet (40 mg total) by mouth at bedtime. 90 tablet 3  . vitamin C (ASCORBIC ACID) 500 MG tablet Take 500 mg by mouth  daily.    Scott Roth VITAMIN E PO Take 1 tablet by mouth daily.      No current facility-administered medications for this visit.    Allergies:   Penicillins    Social History:  The patient  reports that he has never smoked. He has never used smokeless tobacco. He reports that he does not drink alcohol or use illicit drugs.   Family History:  The patient's family history includes Heart failure in his father; Hypertension in his mother.    ROS:  Please see the history of present illness.   Otherwise, review of systems are positive for right knee pain and stiffness. He has excessive daytime sleepiness. His wife states that he snores and stops breathing while sleeping..   All other systems are reviewed and negative.    PHYSICAL EXAM: VS:  BP 108/60 mmHg  Pulse 92   Ht 5\' 7"  (1.702 m)  Wt 201 lb 6.4 oz (91.354 kg)  BMI 31.54 kg/m2 , BMI Body mass index is 31.54 kg/(m^2). GEN: Well nourished, well developed, in no acute distress HEENT: normal Neck: no JVD, carotid bruits, or masses Cardiac: RRR; no murmurs, rubs, or gallops,no edema  Respiratory:  clear to auscultation bilaterally, normal work of breathing GI: soft, nontender, nondistended, + BS MS: no deformity or atrophy Skin: warm and dry, no rash Neuro:  Strength and sensation are intact Psych: euthymic mood, full affect   EKG:  EKG is ordered today. The ekg ordered today demonstrates normal sinus rhythm with borderline short PR and nonspecific generalized T-wave flattening and PACs. Unchanged when compared to May 2015.   Recent Labs: 08/12/2014: ALT 25 08/26/2014: BUN 9; Creatinine 0.84; Hemoglobin 13.1; Platelets 248; Potassium 4.3; Sodium 134*    Lipid Panel    Component Value Date/Time   CHOL 173 09/02/2013 1704   TRIG 180.0* 09/02/2013 1704   HDL 38.20* 09/02/2013 1704   CHOLHDL 5 09/02/2013 1704   VLDL 36.0 09/02/2013 1704   LDLCALC 99 09/02/2013 1704      Wt Readings from Last 3 Encounters:  08/31/14 201 lb 6.4 oz (91.354 kg)  08/25/14 206 lb (93.441 kg)  08/31/13 206 lb 6.4 oz (93.622 kg)      Other studies Reviewed: Additional studies/ records that were reviewed today include: Reviewed recent records from right knee replacement. Review of the above records demonstrates: No clinical difficulty   ASSESSMENT AND PLAN:  Sleep apnea: Untreated  Essential hypertension: Controlled  Hyperlipidemia: On therapy  Coronary artery disease involving coronary bypass graft of native heart without angina pectoris: No obvious evidence of ischemia     Current medicines are reviewed at length with the patient today.  The patient does not have concerns regarding medicines.  The following changes have been made:  no change  He needs to have a sleep study.  Labs/ tests  ordered today include:   Orders Placed This Encounter  Procedures  . EKG 12-Lead  . Split night study     Disposition:   FU with HS in 1 year  Signed, Sinclair Grooms, MD  08/31/2014 6:22 PM    Scott Roth, Scott Roth, Scott Roth  [id number] Phone: [phone]; Fax: [phone]

## 2014-11-04 ENCOUNTER — Ambulatory Visit (HOSPITAL_BASED_OUTPATIENT_CLINIC_OR_DEPARTMENT_OTHER): Payer: BLUE CROSS/BLUE SHIELD | Attending: Cardiology | Admitting: *Deleted

## 2014-11-04 VITALS — Ht 67.0 in | Wt 185.0 lb

## 2014-11-04 DIAGNOSIS — I493 Ventricular premature depolarization: Secondary | ICD-10-CM | POA: Insufficient documentation

## 2014-11-04 DIAGNOSIS — I491 Atrial premature depolarization: Secondary | ICD-10-CM | POA: Insufficient documentation

## 2014-11-04 DIAGNOSIS — G4730 Sleep apnea, unspecified: Secondary | ICD-10-CM

## 2014-11-04 DIAGNOSIS — R0683 Snoring: Secondary | ICD-10-CM | POA: Diagnosis not present

## 2014-11-04 DIAGNOSIS — G4733 Obstructive sleep apnea (adult) (pediatric): Secondary | ICD-10-CM | POA: Diagnosis present

## 2014-12-09 NOTE — Sleep Study (Signed)
NAME: Scott Roth DATE OF BIRTH:  Apr 01, 1956 MEDICAL RECORD NUMBER [id number]  LOCATION: [facility]  PHYSICIAN: KELLY,THOMAS A  DATE OF STUDY: 11/04/2014  SLEEP STUDY TYPE: Nocturnal Polysomnogram               REFERRING PHYSICIAN: Sueanne Margarita, MD   Patient Name: Scott Roth, Scott Roth Date: 11/04/2014 Gender: Male D.O.B: 07-15-1955 Age (years): 89 Referring Provider: Not Available Height (inches): 67 Interpreting Physician: Shelva Majestic MD, ABSM Weight (lbs): 185 RPSGT: Ne[place]iemer, Holly BMI: 29 MRN: [id number] Neck Size: 16.00 CLINICAL INFORMATION Sleep Study Type: NPSG   Indication for sleep study: Fatigue, OSA, Snoring, witnessed apnea,non-restorative sleep.   Epworth Sleepiness Score: 18   SLEEP STUDY TECHNIQUE As per the AASM Manual for the Scoring of Sleep and Associated Events v2.3 (April 2016) with a hypopnea requiring 4% desaturations.  The channels recorded and monitored were frontal, central and occipital EEG, electrooculogram (EOG), submentalis EMG (chin), nasal and oral airflow, thoracic and abdominal wall motion, anterior tibialis EMG, snore microphone, electrocardiogram, and pulse oximetry.  MEDICATIONS Patient's medications include:  apixaban (ELIQUIS) 2.5 MG TABS tablet      bisacodyl (DULCOLAX) 5 MG EC tablet 5 mg, Daily PRN     Coenzyme Q10 (CO Q-10) 300 MG CAPS 1 capsule, Daily     Cyanocobalamin (B-12 PO) 1 tablet, Daily     esomeprazole (NEXIUM) 20 MG capsule 20 mg, Daily PRN     Note: ... (Written 08/12/2014 1013)   hydrochlorothiazide (HYDRODIURIL) 25 MG tablet 25 mg, Daily     loratadine-pseudoephedrine (CLARITIN-D 24-HOUR) 10-240 MG per 24 hr tablet 1 tablet, Daily PRN     losartan (COZAAR) 100 MG tablet 100 mg, Daily     methocarbamol (ROBAXIN) 500 MG tablet 500 mg, 4 times daily     Multiple Vitamins-Minerals (MULTIVITAMIN PO) 1 tablet, Daily     ondansetron (ZOFRAN) 4 MG tablet 4 mg, Every 8 hours PRN      oxyCODONE-acetaminophen (ROXICET) 5-325 MG per tablet 1-2 tablet, Every 4 hours PRN     Pyridoxine HCl (B-6 PO) 1 tablet, Daily     simvastatin (ZOCOR) 40 MG tablet 40 mg, Daily at bedtime     vitamin C (ASCORBIC ACID) 500 MG tablet 500 mg, Daily     VITAMIN E PO   Medications self-administered by patient during sleep study : No sleep medicine administered.  SLEEP ARCHITECTURE The study was initiated at 10:37:48 PM and ended at 5:02:24 AM.  Sleep onset time was 41.4 minutes and the sleep efficiency was 41.8%. The total sleep time was 160.7 minutes.  Stage REM latency was 255.5 minutes.  The patient spent 11.20% of the night in stage N1 sleep, 77.29% in stage N2 sleep, 0.00% in stage N3 and 11.51% in REM.  Wake after sleep onset (WASO): 182.5 minutes  Alpha intrusion was absent.  Supine sleep was 0.31%.  RESPIRATORY PARAMETERS The overall apnea/hypopnea index (AHI) was 5.6 per hour. There were 0 total apneas, including 0 obstructive, 0 central and 0 mixed apneas. There were 15 hypopneas and 12 RERAs.  The AHI during Stage REM sleep was 6.5 per hour.  AHI while supine was 0.0 per hour; however, there was minimal supine sleep at 0.5 minutes and 0.3% of total sleep time.   The mean oxygen saturation was 92.92%. The minimum SpO2 during sleep was 87.00%.  Soft snoring was noted during this study.  CARDIAC DATA The 2 lead EKG demonstrated sinus rhythm. The mean  heart rate was 75.70 beats per minute. Other EKG findings include: PVCs.  LEG MOVEMENT DATA The total PLMS were 12 with a resulting PLMS index of 4.48. Associated arousal with leg movement index was 1.9 .  IMPRESSIONS Mild obstructive sleep apnea hypopnea syndrome (AHI = 5.6/h) overall and increased to 6.5/hr during REM sleep. No significant central sleep apnea occurred during this study (CAI = 0.0/h). Reduced sleep efficiency at only 41.8% and with only minimal supine sleep (0.5 minutes).  Mild oxygen desaturation was  noted during this study (Min O2 = 87.00%). The patient snored with Soft snoring volume. EKG findings include PVCs and PAC's. Clinically significant periodic limb movements did not occur during sleep with an index of 4.48.  No significant associated arousals.  DIAGNOSIS Obstructive Sleep Apnea (327.23 [G47.33 ICD-10])  RECOMMENDATIONS Very mild obstructive sleep apnea. However, in this patient with reduced sleep efficiency with minimal supine sleep, the calculated AHI may underestimate the severity of his sleep disordered breathing.  In this patient with significant symptoms and cardiovascular co-morbidities, Epworth scale score of 18 with significant non-restorative sleep, recommend a CPAP titration trial and initiation of CPAP therapy. If patient is against CPAP consider evaluation for a customized oral appliance.  Avoid alcohol, sedatives and other CNS depressants that may worsen sleep apnea and disrupt normal sleep architecture. Sleep hygiene should be reviewed to assess factors that may improve sleep quality. Weight management and regular exercise should be initiated or continued if appropriate.  [place], American Board of Sleep Medicine  ELECTRONICALLY SIGNED ON:  12/09/2014, 3:52 PM [place] PH: [phone]   FX: [phone] [place]

## 2014-12-14 ENCOUNTER — Telehealth: Payer: Self-pay | Admitting: Cardiovascular Disease

## 2014-12-14 ENCOUNTER — Telehealth: Payer: Self-pay | Admitting: *Deleted

## 2014-12-14 NOTE — Telephone Encounter (Signed)
-----   Message from Troy Sine, MD sent at 12/09/2014  3:57 PM EDT ----- [name], Please set up for CPAP titration if pt agreeable.

## 2014-12-14 NOTE — Progress Notes (Signed)
Left message to return a call. 

## 2014-12-14 NOTE — Telephone Encounter (Signed)
Pt is calling you back in regards to his sleep study results.  Thanks

## 2014-12-15 ENCOUNTER — Telehealth: Payer: Self-pay | Admitting: *Deleted

## 2014-12-15 ENCOUNTER — Other Ambulatory Visit: Payer: Self-pay | Admitting: *Deleted

## 2014-12-15 DIAGNOSIS — G4733 Obstructive sleep apnea (adult) (pediatric): Secondary | ICD-10-CM

## 2014-12-15 NOTE — Telephone Encounter (Signed)
Informed patient of sleep study results and recommendations.

## 2014-12-15 NOTE — Telephone Encounter (Signed)
-----   Message from Scott Sine, MD sent at 12/09/2014  3:57 PM EDT ----- [name], Please set up for CPAP titration if pt agreeable.

## 2014-12-15 NOTE — Telephone Encounter (Signed)
Patient returned a call to me. Sleep study results and recommendations were given to patient. He agrees to have CPAP titration. Titration ordered.

## 2014-12-15 NOTE — Progress Notes (Signed)
Spoke with patient. CPAP titration ordered.

## 2014-12-23 ENCOUNTER — Ambulatory Visit (HOSPITAL_BASED_OUTPATIENT_CLINIC_OR_DEPARTMENT_OTHER): Payer: BLUE CROSS/BLUE SHIELD | Attending: Cardiovascular Disease | Admitting: *Deleted

## 2014-12-23 DIAGNOSIS — G4733 Obstructive sleep apnea (adult) (pediatric): Secondary | ICD-10-CM

## 2014-12-23 DIAGNOSIS — G4730 Sleep apnea, unspecified: Secondary | ICD-10-CM | POA: Diagnosis present

## 2014-12-25 ENCOUNTER — Telehealth: Payer: Self-pay | Admitting: Cardiology

## 2014-12-25 NOTE — Telephone Encounter (Signed)
Pt had successful PAP titration. Please setup appointment in 10 weeks. Please let [facility] know that order for PAP is in EPIC.   

## 2014-12-25 NOTE — Addendum Note (Signed)
Addended by: Sueanne Margarita on: 12/25/2014 04:33 PM   Modules accepted: Orders

## 2014-12-25 NOTE — Progress Notes (Signed)
   Patient Name: Scott Roth, Scott Roth MRN: [id number] Study Date: 12/23/2014 Gender: Male D.O.B: 1956/02/21 Age (years): 33 Referring Provider: Not Available Interpreting Physician: Fransico Him MD, ABSM RPSGT: Neeriemer, Holly  Weight (lbs): 185 Height (inches): 67 BMI: 29 Neck Size: 16.00  CLINICAL INFORMATION The patient is referred for a CPAP titration to treat sleep apnea. Date of NPSG, Split Night or HST: 12/15/2014  SLEEP STUDY TECHNIQUE As per the AASM Manual for the Scoring of Sleep and Associated Events v2.3 (April 2016) with a hypopnea requiring 4% desaturations.  The channels recorded and monitored were frontal, central and occipital EEG, electrooculogram (EOG), submentalis EMG (chin), nasal and oral airflow, thoracic and abdominal wall motion, anterior tibialis EMG, snore microphone, electrocardiogram, and pulse oximetry. Continuous positive airway pressure (CPAP) was initiated at the beginning of the study and titrated to treat sleep-disordered breathing.  MEDICATIONS Medications taken by the patient : Eliquis, Nexium, Claritin D, Losartan, Robaxin, Zofran, Losartan, Roxicet, Zocor Medications administered by patient during sleep study : No sleep medicine administered.  TECHNICIAN COMMENTS Comments added by technician: Patient had difficulty initiating sleep.  Comments added by scorer: N/A  RESPIRATORY PARAMETERS Optimal PAP Pressure (cm): 7  AHI at Optimal Pressure (/hr):0.0 Overall Minimal O2 (%):90.00   Supine % at Optimal Pressure (%):0 Minimal O2 at Optimal Pressure (%):91.0      SLEEP ARCHITECTURE The study was initiated at 11:13:58 PM and ended at 5:17:17 AM.  Sleep onset time was 22.0 minutes and the sleep efficiency was reduced at 62.5%. The total sleep time was 227.0 minutes.  The patient spent 6.83% of the night in stage N1 sleep, 66.74% in stage N2 sleep, 0.00% in stage N3 and 26.43% in REM.  Stage REM latency was 126.0 minutes  Wake after sleep  onset was 114.3. Alpha intrusion was absent. Supine sleep was 1.10%.  CARDIAC DATA The 2 lead EKG demonstrated sinus rhythm. The mean heart rate was 65.68 beats per minute. Other EKG findings include: None.  LEG MOVEMENT DATA The total Periodic Limb Movements of Sleep (PLMS) were 0. The PLMS index was 0.00. A PLMS index of <15 is considered normal in adults.  IMPRESSIONS The optimal PAP pressure was 7 cm of water. Central sleep apnea was not noted during this titration (CAI = 0.0/h). Significant oxygen desaturations were not observed during this titration (min O2 = 90.00%). No snoring was audible during this study. No cardiac abnormalities were observed during this study. Clinically significant periodic limb movements were not noted during this study. Arousals associated with PLMs were rare.  DIAGNOSIS Obstructive Sleep Apnea (327.23 [G47.33 ICD-10])  RECOMMENDATIONS Trial of CPAP therapy on 7 cm H2O with a Medium size Fisher&Paykel Nasal Mask Eson mask and heated humidification. Avoid alcohol, sedatives and other CNS depressants that may worsen sleep apnea and disrupt normal sleep architecture. Sleep hygiene should be reviewed to assess factors that may improve sleep quality. Weight management and regular exercise should be initiated or continued. Return to [facility] for re-evaluation after 10 weeks of therapy and CPAP download in 4 weeks to assess compliance.   Sueanne Margarita Diplomate, American Board of Sleep Medicine  ELECTRONICALLY SIGNED ON:  12/25/2014, 4:23 PM [place] PH: [phone]   FX: [phone] [place]

## 2014-12-27 NOTE — Telephone Encounter (Signed)
Left message for patient to call.

## 2014-12-27 NOTE — Telephone Encounter (Signed)
Patient informed of results. Stated verbal understanding. [place] Notified.  Will schedule 10 weeks once he starts machine.

## 2015-02-18 ENCOUNTER — Encounter (HOSPITAL_BASED_OUTPATIENT_CLINIC_OR_DEPARTMENT_OTHER): Payer: BLUE CROSS/BLUE SHIELD

## 2015-07-16 ENCOUNTER — Other Ambulatory Visit: Payer: Self-pay | Admitting: Interventional Cardiology

## 2015-07-18 NOTE — Telephone Encounter (Signed)
Most recent lipid panel in epic is from 19. Please advise on refill request. Thanks, MI

## 2015-07-25 ENCOUNTER — Other Ambulatory Visit: Payer: Self-pay | Admitting: Interventional Cardiology

## 2016-03-26 ENCOUNTER — Other Ambulatory Visit: Payer: Self-pay | Admitting: Interventional Cardiology

## 2016-06-13 DIAGNOSIS — J40 Bronchitis, not specified as acute or chronic: Secondary | ICD-10-CM | POA: Diagnosis not present

## 2016-06-13 DIAGNOSIS — Z6832 Body mass index (BMI) 32.0-32.9, adult: Secondary | ICD-10-CM | POA: Diagnosis not present

## 2016-06-13 DIAGNOSIS — J111 Influenza due to unidentified influenza virus with other respiratory manifestations: Secondary | ICD-10-CM | POA: Diagnosis not present

## 2016-06-13 DIAGNOSIS — J029 Acute pharyngitis, unspecified: Secondary | ICD-10-CM | POA: Diagnosis not present

## 2016-09-25 DIAGNOSIS — I7090 Unspecified atherosclerosis: Secondary | ICD-10-CM | POA: Diagnosis not present

## 2016-09-25 DIAGNOSIS — R7301 Impaired fasting glucose: Secondary | ICD-10-CM | POA: Diagnosis not present

## 2016-09-25 DIAGNOSIS — K219 Gastro-esophageal reflux disease without esophagitis: Secondary | ICD-10-CM | POA: Diagnosis not present

## 2016-09-25 DIAGNOSIS — E559 Vitamin D deficiency, unspecified: Secondary | ICD-10-CM | POA: Diagnosis not present

## 2016-09-25 DIAGNOSIS — E7800 Pure hypercholesterolemia, unspecified: Secondary | ICD-10-CM | POA: Diagnosis not present

## 2016-09-25 DIAGNOSIS — Z951 Presence of aortocoronary bypass graft: Secondary | ICD-10-CM | POA: Diagnosis not present

## 2016-10-19 ENCOUNTER — Other Ambulatory Visit: Payer: Self-pay | Admitting: Interventional Cardiology

## 2016-10-30 ENCOUNTER — Other Ambulatory Visit: Payer: Self-pay | Admitting: Interventional Cardiology

## 2016-11-03 ENCOUNTER — Other Ambulatory Visit: Payer: Self-pay | Admitting: Interventional Cardiology

## 2017-02-06 DIAGNOSIS — K219 Gastro-esophageal reflux disease without esophagitis: Secondary | ICD-10-CM | POA: Diagnosis not present

## 2017-02-06 DIAGNOSIS — E559 Vitamin D deficiency, unspecified: Secondary | ICD-10-CM | POA: Diagnosis not present

## 2017-02-06 DIAGNOSIS — J029 Acute pharyngitis, unspecified: Secondary | ICD-10-CM | POA: Diagnosis not present

## 2017-02-06 DIAGNOSIS — R221 Localized swelling, mass and lump, neck: Secondary | ICD-10-CM | POA: Diagnosis not present

## 2017-02-07 DIAGNOSIS — J029 Acute pharyngitis, unspecified: Secondary | ICD-10-CM | POA: Diagnosis not present

## 2017-03-11 DIAGNOSIS — G4733 Obstructive sleep apnea (adult) (pediatric): Secondary | ICD-10-CM | POA: Diagnosis not present

## 2017-03-11 DIAGNOSIS — J342 Deviated nasal septum: Secondary | ICD-10-CM | POA: Diagnosis not present

## 2017-03-11 DIAGNOSIS — K219 Gastro-esophageal reflux disease without esophagitis: Secondary | ICD-10-CM | POA: Diagnosis not present

## 2017-03-11 DIAGNOSIS — M270 Developmental disorders of jaws: Secondary | ICD-10-CM | POA: Diagnosis not present

## 2017-05-09 DIAGNOSIS — S300XXD Contusion of lower back and pelvis, subsequent encounter: Secondary | ICD-10-CM | POA: Diagnosis not present

## 2017-05-09 DIAGNOSIS — S300XXA Contusion of lower back and pelvis, initial encounter: Secondary | ICD-10-CM | POA: Diagnosis not present

## 2017-05-09 DIAGNOSIS — X58XXXD Exposure to other specified factors, subsequent encounter: Secondary | ICD-10-CM | POA: Diagnosis not present

## 2017-07-04 ENCOUNTER — Telehealth: Payer: Self-pay | Admitting: Interventional Cardiology

## 2017-07-04 NOTE — Telephone Encounter (Signed)
Pt states he has been having intermittent SOB x 1 month.  Occurs with exertion.  Resolves quickly with rest.  Pt states vitals have been normal but did not have readings available.  Denies any other sx when SOB is occurring.  Scheduled pt next available with Scott Merle, NP on 3/26.  Pt verbalized understanding and was appreciative for call.  Will route to Dr. Tamala Roth to see if any further recommendations.

## 2017-07-04 NOTE — Telephone Encounter (Signed)
New Message:    Pt is calling in regards to SOB has been having for about a month now and is happening occasionally. Pt is experiencing this after moving around. Pt is not having any other symptoms while being SOB. Pt is not currently having it while on the phone with me now.

## 2017-07-04 NOTE — Telephone Encounter (Signed)
Left message to call back  

## 2017-07-05 NOTE — Telephone Encounter (Signed)
Great!

## 2017-07-09 ENCOUNTER — Encounter: Payer: Self-pay | Admitting: Nurse Practitioner

## 2017-07-09 ENCOUNTER — Ambulatory Visit: Payer: BLUE CROSS/BLUE SHIELD | Admitting: Nurse Practitioner

## 2017-07-09 VITALS — BP 124/82 | HR 83 | Ht 67.0 in | Wt 205.0 lb

## 2017-07-09 DIAGNOSIS — R0602 Shortness of breath: Secondary | ICD-10-CM

## 2017-07-09 DIAGNOSIS — E7849 Other hyperlipidemia: Secondary | ICD-10-CM | POA: Diagnosis not present

## 2017-07-09 DIAGNOSIS — I259 Chronic ischemic heart disease, unspecified: Secondary | ICD-10-CM | POA: Diagnosis not present

## 2017-07-09 NOTE — Patient Instructions (Addendum)
We will be checking the following labs today - NONE  Fasting lipids/HPF and BMET on day of stress test   Medication Instructions:    Continue with your current medicines.     Testing/Procedures To Be Arranged:  Stress Myoview  Follow-Up:   See Dr. Tamala Julian in 6 months - unless your stress is abnormal    Other Special Instructions:   N/A    If you need a refill on your cardiac medications before your next appointment, please call your pharmacy.   Call the [place] office at [phone] if you have any questions, problems or concerns.

## 2017-07-09 NOTE — Progress Notes (Signed)
CARDIOLOGY OFFICE NOTE  Date:  07/09/2017    Scott Roth Date of Birth: February 01, 1956 Medical Record #[id number]  PCP:  System, Pcp Not In  Cardiologist:  [place]     Chief Complaint  Patient presents with  . Shortness of Breath    Work in visit - seen for Dr. Tamala Julian    History of Present Illness: Scott Roth is a 62 y.o. male who presents today for a work in visit. Seen for Dr. Tamala Julian.   He has a history of known CAD, with history of coronary artery bypass grafting 2006 [facility]), LAD to diagonal stent in 7416, chronic diastolic left ventricular dysfunction, HLD and hypertension.   Has not been seen since May of 2016 - was doing ok - some sleep issues - felt to have OSA but has declined therapy.   Phone call last week - "Pt states he has been having intermittent SOB x 1 month.  Occurs with exertion.  Resolves quickly with rest.  Pt states vitals have been normal but did not have readings available.  Denies any other sx when SOB is occurring.  Scheduled pt next available with Truitt Merle, NP on 3/26.  Pt verbalized understanding and was appreciative for call.  Will route to Dr. Tamala Julian to see if any further recommendations". Thus added to my schedule for today.   Comes in today. Here alone. He tells me his PCP is Scott Roth - in [place]. I can see some records in [facility]. Seen there a few months ago. His history is a little hard to follow - admits it is hard for him to accept getting "old".  He says he is feeling good. 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 He feels this is unusual for him to be this tired. He notes this is similar to how he presented prior to CABG - then says "well I'm getting old" and then he says "I'm fine". Then says he gets short of breath. He admits he does eat more. He has not been successful with weight loss. Refuses to wear CPAP. Does not sound like he has had actual chest discomfort but then says he has  - nothing like his prior chest pain syndrome. He lifts weights - no real aerobic activity. He feels like he might need a stress test.   Past Medical History:  Diagnosis Date  . Arthritis   . CAD (coronary artery disease)   . Erectile dysfunction   . GERD (gastroesophageal reflux disease)   . Hyperlipidemia   . Hypertension   . Sleep apnea    does not wear mask  . Tubular adenoma     Past Surgical History:  Procedure Laterality Date  . 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 CORONARY ARTERY BYPASS GRAFT  2008  . EYE SURGERY Bilateral    Lasik  . KNEE ARTHROPLASTY Left   . SHOULDER SURGERY Right   . TOTAL KNEE ARTHROPLASTY Right 08/25/2014  . TOTAL KNEE ARTHROPLASTY Right 08/25/2014   Procedure: RIGHT TOTAL KNEE ARTHROPLASTY;  Surgeon: Kathryne Hitch, MD;  Location: [facility];  Service: Orthopedics;  Laterality: Right;     Medications: Current Meds  Medication Sig  . Coenzyme Q10 (CO Q-10) 300 MG CAPS Take 1 capsule by mouth daily.  . Cyanocobalamin (B-12 PO) Take 1 tablet by mouth daily.   . hydrochlorothiazide (  HYDRODIURIL) 25 MG tablet Take 1 tablet (25 mg total) by mouth daily.  Marland Kitchen losartan (COZAAR) 100 MG tablet Take 1 tablet (100 mg total) by mouth daily.  . Multiple Vitamins-Minerals (MULTIVITAMIN PO) Take 1 tablet by mouth daily.   . Pyridoxine HCl (B-6 PO) Take 1 tablet by mouth daily.   . simvastatin (ZOCOR) 40 MG tablet Take 1 tablet (40 mg total) by mouth at bedtime.  . vitamin C (ASCORBIC ACID) 500 MG tablet Take 500 mg by mouth daily.  Marland Kitchen VITAMIN E PO Take 1 tablet by mouth daily.   . [DISCONTINUED] apixaban (ELIQUIS) 2.5 MG TABS tablet Take 1 tablet po q12 hours x 12 days following surgery to prevent blood clots  . [DISCONTINUED] bisacodyl (DULCOLAX) 5 MG EC tablet Take 1 tablet (5 mg total) by mouth daily as needed for moderate constipation.  . [DISCONTINUED] esomeprazole (NEXIUM) 20 MG capsule Take 20 mg by mouth daily  as needed.   . [DISCONTINUED] loratadine-pseudoephedrine (CLARITIN-D 24-HOUR) 10-240 MG per 24 hr tablet Take 1 tablet by mouth daily as needed for allergies.  . [DISCONTINUED] methocarbamol (ROBAXIN) 500 MG tablet Take 1 tablet (500 mg total) by mouth 4 (four) times daily.  . [DISCONTINUED] ondansetron (ZOFRAN) 4 MG tablet Take 1 tablet (4 mg total) by mouth every 8 (eight) hours as needed for nausea or vomiting.  . [DISCONTINUED] oxyCODONE-acetaminophen (ROXICET) 5-325 MG per tablet Take 1-2 tablets by mouth every 4 (four) hours as needed.     Allergies: Allergies  Allergen Reactions  . Penicillins Itching and Rash    Social History: The patient  reports that he has never smoked. He has never used smokeless tobacco. He reports that he does not drink alcohol or use drugs.   Family History: The patient's family history includes Heart failure in his father; Hypertension in his mother.   Review of Systems: Please see the history of present illness.   Otherwise, the review of systems is positive for none.   All other systems are reviewed and negative.   Physical Exam: VS:  BP 124/82 (BP Location: Right Arm, Patient Position: Sitting, Cuff Size: Normal)   Pulse 83   Ht 5\' 7"  (1.702 m)   Wt 205 lb (93 kg)   SpO2 95%   BMI 32.11 kg/m  .  BMI Body mass index is 32.11 kg/m.  Wt Readings from Last 3 Encounters:  07/09/17 205 lb (93 kg)  12/23/14 187 lb (84.8 kg)  11/04/14 185 lb (83.9 kg)    General: Pleasant. He is overweight. He is rather talkative. Alert and in no acute distress.   HEENT: Normal.  Neck: Supple, no JVD, carotid bruits, or masses noted.  Cardiac: Regular rate and rhythm. No murmurs, rubs, or gallops. No edema.  Respiratory:  Lungs are clear to auscultation bilaterally with normal work of breathing.  GI: Soft and nontender.  MS: No deformity or atrophy. Gait and ROM intact.  Skin: Warm and dry. Color is normal.  Neuro:  Strength and sensation are intact and no  gross focal deficits noted.  Psych: Alert, appropriate and with normal affect.   LABORATORY DATA:  EKG:  EKG is ordered today. This demonstrates NSR with PAC.  Lab Results  Component Value Date   WBC 10.2 08/26/2014   HGB 13.1 08/26/2014   HCT 36.8 (L) 08/26/2014   PLT 248 08/26/2014   GLUCOSE 141 (H) 08/26/2014   CHOL 173 09/02/2013   TRIG 180.0 (H) 09/02/2013   HDL 38.20 (L) 09/02/2013  LDLCALC 99 09/02/2013   ALT 25 08/12/2014   AST 25 08/12/2014   NA 134 (L) 08/26/2014   K 4.3 08/26/2014   CL 99 (L) 08/26/2014   CREATININE 0.84 08/26/2014   BUN 9 08/26/2014   CO2 27 08/26/2014   PSA 0.36 08/31/2013   INR 0.95 08/12/2014   HGBA1C 6.3 08/31/2013     BNP (last 3 results) No results for input(s): BNP in the last 8760 hours.  ProBNP (last 3 results) No results for input(s): PROBNP in the last 8760 hours.   Other Studies Reviewed Today:   Assessment/Plan:  1.CAD with remote CABG - prior PCI - history is hard to follow - unclear if is having issues or not - he has not had any recent testing. Will arrange for stress Myoview. Further disposition to follow.   3. HTN - BP ok on current regimen.   4. HLD - on statin - not fasting today - will check on day of stress test.   5. Obesity - discussed at length.  Current medicines are reviewed with the patient today.  The patient does not have concerns regarding medicines other than what has been noted above.  The following changes have been made:  See above.  Labs/ tests ordered today include:    Orders Placed This Encounter  Procedures  . Basic metabolic panel  . Hepatic function panel  . Lipid panel  . MYOCARDIAL PERFUSION IMAGING  . EKG 12-Lead     Disposition:   FU with Dr. Tamala Julian in 6 months - unless stress test is abnormal.    Patient is agreeable to this plan and will call if any problems develop in the interim.   SignedTruitt Merle, NP  07/09/2017 3:04 PM  [facility] [address] [place], [place]  [address] Phone: [phone] Fax: [phone]

## 2017-07-09 NOTE — Progress Notes (Deleted)
CARDIOLOGY OFFICE NOTE  Date:  07/09/2017    [name] Date of Birth: March 14, 1956 Medical Record #[id number]  PCP:  System, Pcp Not In  Cardiologist:  Servando Snare & ***    No chief complaint on file.   History of Present Illness: Scott Roth is a 62 y.o. male who presents today for a ***   Comes in today. Here with   Past Medical History:  Diagnosis Date  . Arthritis   . CAD (coronary artery disease)   . Erectile dysfunction   . GERD (gastroesophageal reflux disease)   . Hyperlipidemia   . Hypertension   . Sleep apnea    does not wear mask  . Tubular adenoma     Past Surgical History:  Procedure Laterality Date  . 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 CORONARY ARTERY BYPASS GRAFT  2008  . EYE SURGERY Bilateral    Lasik  . KNEE ARTHROPLASTY Left   . SHOULDER SURGERY Right   . TOTAL KNEE ARTHROPLASTY Right 08/25/2014  . TOTAL KNEE ARTHROPLASTY Right 08/25/2014   Procedure: RIGHT TOTAL KNEE ARTHROPLASTY;  Surgeon: Kathryne Hitch, MD;  Location: [facility];  Service: Orthopedics;  Laterality: Right;     Medications: No outpatient medications have been marked as taking for the 07/09/17 encounter (Appointment) with Burtis Junes, NP.     Allergies: Allergies  Allergen Reactions  . Penicillins Itching and Rash    Social History: The patient  reports that he has never smoked. He has never used smokeless tobacco. He reports that he does not drink alcohol or use drugs.   Family History: The patient's ***family history includes Heart failure in his father; Hypertension in his mother.   Review of Systems: Please see the history of present illness.   Otherwise, the review of systems is positive for {NONE DEFAULTED:18576::"none"}.   All other systems are reviewed and negative.   Physical Exam: VS:  There were no vitals taken for this visit. Marland Kitchen  BMI There is no height or weight on file to calculate  BMI.  Wt Readings from Last 3 Encounters:  12/23/14 187 lb (84.8 kg)  11/04/14 185 lb (83.9 kg)  08/31/14 201 lb 6.4 oz (91.4 kg)    General: Pleasant. Well developed, well nourished and in no acute distress.   HEENT: Normal.  Neck: Supple, no JVD, carotid bruits, or masses noted.  Cardiac: ***Regular rate and rhythm. No murmurs, rubs, or gallops. No edema.  Respiratory:  Lungs are clear to auscultation bilaterally with normal work of breathing.  GI: Soft and nontender.  MS: No deformity or atrophy. Gait and ROM intact.  Skin: Warm and dry. Color is normal.  Neuro:  Strength and sensation are intact and no gross focal deficits noted.  Psych: Alert, appropriate and with normal affect.   LABORATORY DATA:  EKG:  EKG {ACTION; IS/IS OAC:16606301} ordered today. This demonstrates ***.  Lab Results  Component Value Date   WBC 10.2 08/26/2014   HGB 13.1 08/26/2014   HCT 36.8 (L) 08/26/2014   PLT 248 08/26/2014   GLUCOSE 141 (H) 08/26/2014   CHOL 173 09/02/2013   TRIG 180.0 (H) 09/02/2013   HDL 38.20 (L) 09/02/2013   LDLCALC 99 09/02/2013   ALT 25 08/12/2014   AST 25 08/12/2014   NA 134 (L) 08/26/2014   K 4.3 08/26/2014   CL 99 (L) 08/26/2014   CREATININE 0.84  08/26/2014   BUN 9 08/26/2014   CO2 27 08/26/2014   PSA 0.36 08/31/2013   INR 0.95 08/12/2014   HGBA1C 6.3 08/31/2013     BNP (last 3 results) No results for input(s): BNP in the last 8760 hours.  ProBNP (last 3 results) No results for input(s): PROBNP in the last 8760 hours.   Other Studies Reviewed Today:   Assessment/Plan:   Current medicines are reviewed with the patient today.  The patient does not have concerns regarding medicines other than what has been noted above.  The following changes have been made:  See above.  Labs/ tests ordered today include:   No orders of the defined types were placed in this encounter.    Disposition:   FU with *** in {gen number 0-62:694854} {Days to  years:10300}.   Patient is agreeable to this plan and will call if any problems develop in the interim.   SignedTruitt Merle, NP  07/09/2017 7:41 AM  [facility] [address] [place], [place]  [address] Phone: [phone] Fax: [phone]           CARDIOLOGY OFFICE NOTE  Date:  07/09/2017    [name] Date of Birth: [birth year] Medical Record #[id number]  PCP:  System, Pcp Not In  Cardiologist:  Servando Snare & ***    No chief complaint on file.   History of Present Illness: Scott Roth is a 62 y.o. male who presents today for a ***   Comes in today. Here with   Past Medical History:  Diagnosis Date  . Arthritis   . CAD (coronary artery disease)   . Erectile dysfunction   . GERD (gastroesophageal reflux disease)   . Hyperlipidemia   . Hypertension   . Sleep apnea    does not wear mask  . Tubular adenoma     Past Surgical History:  Procedure Laterality Date  . 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 CORONARY ARTERY BYPASS GRAFT  2008  . EYE SURGERY Bilateral    Lasik  . KNEE ARTHROPLASTY Left   . SHOULDER SURGERY Right   . TOTAL KNEE ARTHROPLASTY Right 08/25/2014  . TOTAL KNEE ARTHROPLASTY Right 08/25/2014   Procedure: RIGHT TOTAL KNEE ARTHROPLASTY;  Surgeon: Kathryne Hitch, MD;  Location: [facility];  Service: Orthopedics;  Laterality: Right;     Medications: No outpatient medications have been marked as taking for the 07/09/17 encounter (Appointment) with Burtis Junes, NP.     Allergies: Allergies  Allergen Reactions  . Penicillins Itching and Rash    Social History: The patient  reports that he has never smoked. He has never used smokeless tobacco. He reports that he does not drink alcohol or use drugs.   Family History: The patient's ***family history includes Heart failure in his father; Hypertension in his mother.   Review of  Systems: Please see the history of present illness.   Otherwise, the review of systems is positive for {NONE DEFAULTED:18576::"none"}.   All other systems are reviewed and negative.   Physical Exam: VS:  There were no vitals taken for this visit. Marland Kitchen  BMI There is no height or weight on file to calculate BMI.  Wt Readings from Last 3 Encounters:  12/23/14 187 lb (84.8 kg)  11/04/14 185 lb (83.9 kg)  08/31/14 201 lb 6.4 oz (91.4 kg)    General: Pleasant. Well  developed, well nourished and in no acute distress.   HEENT: Normal.  Neck: Supple, no JVD, carotid bruits, or masses noted.  Cardiac: ***Regular rate and rhythm. No murmurs, rubs, or gallops. No edema.  Respiratory:  Lungs are clear to auscultation bilaterally with normal work of breathing.  GI: Soft and nontender.  MS: No deformity or atrophy. Gait and ROM intact.  Skin: Warm and dry. Color is normal.  Neuro:  Strength and sensation are intact and no gross focal deficits noted.  Psych: Alert, appropriate and with normal affect.   LABORATORY DATA:  EKG:  EKG {ACTION; IS/IS TML:46503546} ordered today. This demonstrates ***.  Lab Results  Component Value Date   WBC 10.2 08/26/2014   HGB 13.1 08/26/2014   HCT 36.8 (L) 08/26/2014   PLT 248 08/26/2014   GLUCOSE 141 (H) 08/26/2014   CHOL 173 09/02/2013   TRIG 180.0 (H) 09/02/2013   HDL 38.20 (L) 09/02/2013   LDLCALC 99 09/02/2013   ALT 25 08/12/2014   AST 25 08/12/2014   NA 134 (L) 08/26/2014   K 4.3 08/26/2014   CL 99 (L) 08/26/2014   CREATININE 0.84 08/26/2014   BUN 9 08/26/2014   CO2 27 08/26/2014   PSA 0.36 08/31/2013   INR 0.95 08/12/2014   HGBA1C 6.3 08/31/2013     BNP (last 3 results) No results for input(s): BNP in the last 8760 hours.  ProBNP (last 3 results) No results for input(s): PROBNP in the last 8760 hours.   Other Studies Reviewed Today:   Assessment/Plan:   Current medicines are reviewed with the patient today.  The patient does not  have concerns regarding medicines other than what has been noted above.  The following changes have been made:  See above.  Labs/ tests ordered today include:   No orders of the defined types were placed in this encounter.    Disposition:   FU with *** in {gen number 5-68:127517} {Days to years:10300}.   Patient is agreeable to this plan and will call if any problems develop in the interim.   SignedTruitt Merle, NP  07/09/2017 7:41 AM  [facility] [address] [place], [place]  [address] Phone: [phone] Fax: [phone]

## 2017-07-10 ENCOUNTER — Telehealth (HOSPITAL_COMMUNITY): Payer: Self-pay | Admitting: *Deleted

## 2017-07-10 NOTE — Telephone Encounter (Signed)
Left message on voicemail in reference to upcoming appointment scheduled for 07/15/17 Phone number given for a call back so details instructions can be given.  Kirstie Peri, RN

## 2017-07-15 ENCOUNTER — Other Ambulatory Visit: Payer: BLUE CROSS/BLUE SHIELD | Admitting: *Deleted

## 2017-07-15 ENCOUNTER — Ambulatory Visit (HOSPITAL_COMMUNITY): Payer: BLUE CROSS/BLUE SHIELD | Attending: Cardiology

## 2017-07-15 DIAGNOSIS — R9439 Abnormal result of other cardiovascular function study: Secondary | ICD-10-CM | POA: Diagnosis not present

## 2017-07-15 DIAGNOSIS — I259 Chronic ischemic heart disease, unspecified: Secondary | ICD-10-CM

## 2017-07-15 DIAGNOSIS — I119 Hypertensive heart disease without heart failure: Secondary | ICD-10-CM | POA: Diagnosis not present

## 2017-07-15 DIAGNOSIS — I2510 Atherosclerotic heart disease of native coronary artery without angina pectoris: Secondary | ICD-10-CM | POA: Insufficient documentation

## 2017-07-15 DIAGNOSIS — R0602 Shortness of breath: Secondary | ICD-10-CM | POA: Insufficient documentation

## 2017-07-15 DIAGNOSIS — E7849 Other hyperlipidemia: Secondary | ICD-10-CM | POA: Diagnosis not present

## 2017-07-15 LAB — BASIC METABOLIC PANEL
BUN/Creatinine Ratio: 17 (ref 10–24)
BUN: 19 mg/dL (ref 8–27)
CO2: 22 mmol/L (ref 20–29)
Calcium: 9.6 mg/dL (ref 8.6–10.2)
Chloride: 99 mmol/L (ref 96–106)
Creatinine, Ser: 1.1 mg/dL (ref 0.76–1.27)
GFR calc Af Amer: 83 mL/min/{1.73_m2} (ref >59)
GFR calc non Af Amer: 72 mL/min/{1.73_m2} (ref >59)
Glucose: 107 mg/dL — ABNORMAL HIGH (ref 65–99)
Potassium: 4.3 mmol/L (ref 3.5–5.2)
Sodium: 137 mmol/L (ref 134–144)

## 2017-07-15 LAB — LIPID PANEL
Chol/HDL Ratio: 3.4 ratio (ref 0.0–5.0)
Cholesterol, Total: 157 mg/dL (ref 100–199)
HDL: 46 mg/dL (ref >39)
LDL Calculated: 85 mg/dL (ref 0–99)
Triglycerides: 128 mg/dL (ref 0–149)
VLDL Cholesterol Cal: 26 mg/dL (ref 5–40)

## 2017-07-15 LAB — HEPATIC FUNCTION PANEL
ALT: 28 IU/L (ref 0–44)
AST: 24 IU/L (ref 0–40)
Albumin: 4.2 g/dL (ref 3.6–4.8)
Alkaline Phosphatase: 70 IU/L (ref 39–117)
Bilirubin Total: 0.6 mg/dL (ref 0.0–1.2)
Bilirubin, Direct: 0.16 mg/dL (ref 0.00–0.40)
Total Protein: 7 g/dL (ref 6.0–8.5)

## 2017-07-15 LAB — MYOCARDIAL PERFUSION IMAGING
Estimated workload: 9.3 METS
Exercise duration (min): 7 min
Exercise duration (sec): 30 s
LV dias vol: 162 mL (ref 62–150)
LV sys vol: 109 mL
MPHR: 158 {beats}/min
Peak HR: 144 {beats}/min
Percent HR: 91 %
RATE: 0.37
Rest HR: 76 {beats}/min
SDS: 0
SRS: 11
SSS: 11
TID: 1.04

## 2017-07-15 MED ORDER — TECHNETIUM TC 99M TETROFOSMIN IV KIT
10.3000 | PACK | Freq: Once | INTRAVENOUS | Status: AC | PRN
  Administered 2017-07-15: 10.3 via INTRAVENOUS
  Filled 2017-07-15: qty 11

## 2017-07-15 MED ORDER — TECHNETIUM TC 99M TETROFOSMIN IV KIT
32.4000 | PACK | Freq: Once | INTRAVENOUS | Status: AC | PRN
  Administered 2017-07-15: 32.4 via INTRAVENOUS
  Filled 2017-07-15: qty 33

## 2017-07-18 ENCOUNTER — Ambulatory Visit: Payer: BLUE CROSS/BLUE SHIELD | Admitting: Physician Assistant

## 2017-07-18 ENCOUNTER — Encounter: Payer: Self-pay | Admitting: Physician Assistant

## 2017-07-18 VITALS — BP 120/82 | HR 84 | Ht 67.0 in | Wt 202.8 lb

## 2017-07-18 DIAGNOSIS — I10 Essential (primary) hypertension: Secondary | ICD-10-CM

## 2017-07-18 DIAGNOSIS — R9439 Abnormal result of other cardiovascular function study: Secondary | ICD-10-CM | POA: Diagnosis not present

## 2017-07-18 DIAGNOSIS — G4733 Obstructive sleep apnea (adult) (pediatric): Secondary | ICD-10-CM | POA: Diagnosis not present

## 2017-07-18 DIAGNOSIS — I25810 Atherosclerosis of coronary artery bypass graft(s) without angina pectoris: Secondary | ICD-10-CM

## 2017-07-18 DIAGNOSIS — E785 Hyperlipidemia, unspecified: Secondary | ICD-10-CM | POA: Diagnosis not present

## 2017-07-18 DIAGNOSIS — I519 Heart disease, unspecified: Secondary | ICD-10-CM

## 2017-07-18 MED ORDER — ROSUVASTATIN CALCIUM 40 MG PO TABS: 40.0000 mg | ORAL_TABLET | Freq: Every day | ORAL | 3 refills | Status: DC

## 2017-07-18 MED ORDER — ASPIRIN EC 81 MG PO TBEC: 81.0000 mg | DELAYED_RELEASE_TABLET | Freq: Every day | ORAL | 3 refills | Status: DC

## 2017-07-18 MED ORDER — METOPROLOL SUCCINATE ER 25 MG PO TB24: 25.0000 mg | ORAL_TABLET | Freq: Every day | ORAL | 3 refills | Status: DC

## 2017-07-18 MED ORDER — HYDROCHLOROTHIAZIDE 12.5 MG PO TABS: 12.5000 mg | ORAL_TABLET | Freq: Every day | ORAL | 3 refills | Status: DC

## 2017-07-18 NOTE — Progress Notes (Signed)
Cardiology Office Note    Date:  07/19/2017   ID:  Scott Roth, DOB 1956-01-02, MRN [id number]  PCP:  System, Pcp Not In  Cardiologist:  Dr. Tamala Julian  Chief Complaint  Patient presents with  . Follow-up    seen for Dr. Tamala Julian. Abnormal myoview    History of Present Illness:  Scott Roth is a 62 y.o. male with PMH of HTN, HLD, OSA not on CPAP and CAD s/p CABG x4 (LIMA to dLAD, free RIMA to OM1, SVG to D1, SVG to OM2) 06/16/2004 by Dr. Roxy Manns.  He had a history of PCI DES to prox LAD/1st diagonal in 2008, patent LIMA to LAD, patent RIMA to OM1, occluded SVG to OM 2, SVG to D1 was occluded. EF 50% at the time.  He did develop ventricular fibrillation during the cardiac catheterization requiring defibrillation.  Patient was last seen by Truitt Merle on 07/09/2017 at which time he complained of intermittent shortness of breath for 1 month which occurs with exertion and resolves quickly with rest.  He refuses to wear CPAP.  He eventually underwent Myoview on 07/15/2017 which showed EF 33%, medium defect of severe severity present in the apical anterior, apical lateral and apex location consistent with prior infarction, no ischemia present, EF 33%.  The report was reviewed by Dr. Tamala Julian, given the new LV dysfunction, there was concern of prior heart attack, therefore it was recommended for the patient to undergo cardiac catheterization.  Patient presents today for cardiology office visit.  He was in [place] 3 months ago for mountain climbing when he had increasing shortness of breath and chest pressure.  He continued to have exertional shortness of breath at this time.  We have discussed with him the benefit and risk of cardiac catheterization, he has also discussed with Dr. Tamala Julian over the phone as well, he is very hesitant to proceed with cardiac catheterization especially when he had ventricular fibrillation required defibrillation during the last cardiac catheterization.  He wished to do a trial  of medical therapy first.  I will decrease his hydrochlorothiazide to 12.5 mg daily, add Toprol-XL 25 mg daily.  Add on 81 mg aspirin.  His recent lab work also shows his LDL still high at 85, I will change his Zocor to Crestor 40 mg daily.  Otherwise he appears to be euvolemic on physical exam.  We will hold off on cardiac catheterization at this time after further discussion with the patient and Dr. Tamala Julian, I will have him see Dr. Tamala Julian in the office in the next 4-6 weeks to see if there is any improvement.  I will defer to Dr. Tamala Julian to decide whether or not to obtain a repeat echocardiogram at that time.    Past Medical History:  Diagnosis Date  . Arthritis   . CAD (coronary artery disease)   . Erectile dysfunction   . GERD (gastroesophageal reflux disease)   . Hyperlipidemia   . Hypertension   . Sleep apnea    does not wear mask  . Tubular adenoma     Past Surgical History:  Procedure Laterality Date  . 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 CORONARY ARTERY BYPASS GRAFT  2008  . EYE SURGERY Bilateral    Lasik  . KNEE ARTHROPLASTY Left   . SHOULDER SURGERY Right   . TOTAL KNEE ARTHROPLASTY Right 08/25/2014  . TOTAL KNEE ARTHROPLASTY Right 08/25/2014   Procedure:  RIGHT TOTAL KNEE ARTHROPLASTY;  Surgeon: Kathryne Hitch, MD;  Location: [facility];  Service: Orthopedics;  Laterality: Right;    Current Medications: Outpatient Medications Prior to Visit  Medication Sig Dispense Refill  . Coenzyme Q10 (CO Q-10) 300 MG CAPS Take 1 capsule by mouth daily.  0  . Cyanocobalamin (B-12 PO) Take 1 tablet by mouth daily.     Marland Kitchen losartan (COZAAR) 100 MG tablet Take 1 tablet (100 mg total) by mouth daily. 30 tablet 9  . Multiple Vitamins-Minerals (MULTIVITAMIN PO) Take 1 tablet by mouth daily.     . Pyridoxine HCl (B-6 PO) Take 1 tablet by mouth daily.     . vitamin C (ASCORBIC ACID) 500 MG tablet Take 500 mg by mouth daily.    Marland Kitchen VITAMIN E PO Take 1  tablet by mouth daily.     . hydrochlorothiazide (HYDRODIURIL) 25 MG tablet Take 1 tablet (25 mg total) by mouth daily. 30 tablet 9  . simvastatin (ZOCOR) 40 MG tablet Take 1 tablet (40 mg total) by mouth at bedtime. 90 tablet 0   No facility-administered medications prior to visit.      Allergies:   Penicillins   Social History   Socioeconomic History  . Marital status: Single    Spouse name: Not on file  . Number of children: Not on file  . Years of education: Not on file  . Highest education level: Not on file  Occupational History  . Not on file  Social Needs  . Financial resource strain: Not on file  . Food insecurity:    Worry: Not on file    Inability: Not on file  . Transportation needs:    Medical: Not on file    Non-medical: Not on file  Tobacco Use  . Smoking status: Never Smoker  . Smokeless tobacco: Never Used  Substance and Sexual Activity  . Alcohol use: No  . Drug use: No  . Sexual activity: Not on file  Lifestyle  . Physical activity:    Days per week: Not on file    Minutes per session: Not on file  . Stress: Not on file  Relationships  . Social connections:    Talks on phone: Not on file    Gets together: Not on file    Attends religious service: Not on file    Active member of club or organization: Not on file    Attends meetings of clubs or organizations: Not on file    Relationship status: Not on file  Other Topics Concern  . Not on file  Social History Narrative  . Not on file     Family History:  The patient's family history includes Heart failure in his father; Hypertension in his mother.   ROS:   Please see the history of present illness.    ROS All other systems reviewed and are negative.   PHYSICAL EXAM:   VS:  BP 120/82   Pulse 84   Ht 5\' 7"  (1.702 m)   Wt 202 lb 12.8 oz (92 kg)   BMI 31.76 kg/m    GEN: Well nourished, well developed, in no acute distress  HEENT: normal  Neck: no JVD, carotid bruits, or masses Cardiac:  RRR; no murmurs, rubs, or gallops,no edema  Respiratory:  clear to auscultation bilaterally, normal work of breathing GI: soft, nontender, nondistended, + BS MS: no deformity or atrophy  Skin: warm and dry, no rash Neuro:  Alert and Oriented x 3, Strength and  sensation are intact Psych: euthymic mood, full affect  Wt Readings from Last 3 Encounters:  07/18/17 202 lb 12.8 oz (92 kg)  07/15/17 205 lb (93 kg)  07/09/17 205 lb (93 kg)      Studies/Labs Reviewed:   EKG:  EKG is not ordered today.   Recent Labs: 07/15/2017: ALT 28; BUN 19; Creatinine, Ser 1.10; Potassium 4.3; Sodium 137   Lipid Panel    Component Value Date/Time   CHOL 157 07/15/2017 0740   TRIG 128 07/15/2017 0740   HDL 46 07/15/2017 0740   CHOLHDL 3.4 07/15/2017 0740   CHOLHDL 5 09/02/2013 1704   VLDL 36.0 09/02/2013 1704   LDLCALC 85 07/15/2017 0740    Additional studies/ records that were reviewed today include:   Myoview 07/15/2017 Study Highlights     Nuclear stress EF: 33%.  There was no ST segment deviation noted during stress.  Defect 1: There is a medium defect of severe severity present in the apical anterior, apical lateral and apex location.  Findings consistent with prior myocardial infarction.  This is a high risk study.  The left ventricular ejection fraction is moderately decreased (30-44%).   There is a medium size, irreversible defect in the apical anterior, lateral walls and in the true apex consistent with prior infarct. The study is a high risk given severely decreased LVEF of 33%, but no ischemia is present.      ASSESSMENT:    1. Abnormal stress test   2. Coronary artery disease involving coronary bypass graft of native heart without angina pectoris   3. Essential hypertension   4. Hyperlipidemia, unspecified hyperlipidemia type   5. OSA (obstructive sleep apnea)   6. LV dysfunction     PLAN:  In order of problems listed above:  1. Abnormal Myoview: Recent stress  test shows EF 33% which is down from the previous 55% on LV gram.  We had a long discussion regarding the previous workup and our recommendation for cardiac catheterization for definitive diagnosis.  However patient is very hesitant to proceed with cardiac catheterization especially given the previous ventricular arrhythmia during the surgery that required defibrillation.  I reassured him that it does occur only about 1 in 500 cases.  However after discussing with Dr. Tamala Julian, he wished to proceed with medical therapy first.  2. LV dysfunction: Noted on a recent stress test.  We will decrease his hydrochlorothiazide to 12.5 mg daily and start on Toprol-XL 25 mg daily.  3. CAD: Start on aspirin 81 mg daily.  On Crestor 40 mg daily.  4. Hyperlipidemia: we will transition Zocor to Crestor 40 mg daily given uncontrolled hyperlipidemia.  5. Hypertension: Blood pressure stable, however will decrease hydrochlorothiazide in order to add Toprol-XL for LV dysfunction.    Medication Adjustments/Labs and Tests Ordered: Current medicines are reviewed at length with the patient today.  Concerns regarding medicines are outlined above.  Medication changes, Labs and Tests ordered today are listed in the Patient Instructions below. Patient Instructions  Almyra Deforest, PA has recommended making the following medication changes: 1. STOP Simvastatin 2. START Rosuvastatin (Crestor) 40 mg - take 1 tablet daily 3. START Metoprolol SUCCinate 25 mg - take 1 tablet daily 4. START Aspirin 81 mg - take 1 tablet daily (this is over-the-counter) 5. DECREASE Hydrochlorothiazide (HCTZ) to 12.5 mg daily  Your physician recommends that you schedule a follow-up appointment in 4-6 weeks with Dr Tamala Julian ONLY.  If you need a refill on your cardiac medications before your  next appointment, please call your pharmacy.    Hilbert Corrigan, [place]  07/19/2017 1:12 PM    [facility] [address], [place], [place]   [address] Phone: [phone]; Fax: [phone]

## 2017-07-18 NOTE — Patient Instructions (Signed)
Scott Roth, [place] has recommended making the following medication changes: 1. STOP Simvastatin 2. START Rosuvastatin (Crestor) 40 mg - take 1 tablet daily 3. START Metoprolol SUCCinate 25 mg - take 1 tablet daily 4. START Aspirin 81 mg - take 1 tablet daily (this is over-the-counter) 5. DECREASE Hydrochlorothiazide (HCTZ) to 12.5 mg daily  Your physician recommends that you schedule a follow-up appointment in 4-6 weeks with Dr Tamala Julian ONLY.  If you need a refill on your cardiac medications before your next appointment, please call your pharmacy.

## 2017-07-19 ENCOUNTER — Encounter: Payer: Self-pay | Admitting: Physician Assistant

## 2017-07-23 ENCOUNTER — Telehealth: Payer: Self-pay | Admitting: Interventional Cardiology

## 2017-07-23 NOTE — Telephone Encounter (Signed)
Patient calling, states that he would like to discuss the procedure that Dr. Tamala Julian recommended.

## 2017-07-23 NOTE — Telephone Encounter (Signed)
Pt is wanting to get a copy of his recent stress test so that he can look over everything and decide on whether or not to move forward with heart cath.  Advised I would send message to medical records and have them reach out to pt to obtain a copy.  Pt appreciative for call.

## 2017-07-24 ENCOUNTER — Telehealth: Payer: Self-pay | Admitting: Physician Assistant

## 2017-07-24 ENCOUNTER — Telehealth: Payer: Self-pay | Admitting: *Deleted

## 2017-07-24 ENCOUNTER — Encounter (HOSPITAL_COMMUNITY): Payer: Self-pay

## 2017-07-24 ENCOUNTER — Observation Stay (HOSPITAL_COMMUNITY)
Admission: EM | Admit: 2017-07-24 | Discharge: 2017-07-26 | Disposition: A | Discharge status: Home / Self Care | Payer: BLUE CROSS/BLUE SHIELD | Attending: Interventional Cardiology | Admitting: Interventional Cardiology

## 2017-07-24 ENCOUNTER — Emergency Department (HOSPITAL_COMMUNITY): Payer: BLUE CROSS/BLUE SHIELD

## 2017-07-24 ENCOUNTER — Other Ambulatory Visit: Payer: Self-pay

## 2017-07-24 DIAGNOSIS — R110 Nausea: Secondary | ICD-10-CM | POA: Diagnosis not present

## 2017-07-24 DIAGNOSIS — E785 Hyperlipidemia, unspecified: Secondary | ICD-10-CM | POA: Insufficient documentation

## 2017-07-24 DIAGNOSIS — R079 Chest pain, unspecified: Secondary | ICD-10-CM | POA: Diagnosis not present

## 2017-07-24 DIAGNOSIS — I25110 Atherosclerotic heart disease of native coronary artery with unstable angina pectoris: Secondary | ICD-10-CM | POA: Diagnosis not present

## 2017-07-24 DIAGNOSIS — I110 Hypertensive heart disease with heart failure: Secondary | ICD-10-CM | POA: Diagnosis not present

## 2017-07-24 DIAGNOSIS — Z880 Allergy status to penicillin: Secondary | ICD-10-CM | POA: Insufficient documentation

## 2017-07-24 DIAGNOSIS — I2582 Chronic total occlusion of coronary artery: Secondary | ICD-10-CM | POA: Insufficient documentation

## 2017-07-24 DIAGNOSIS — Z955 Presence of coronary angioplasty implant and graft: Secondary | ICD-10-CM | POA: Insufficient documentation

## 2017-07-24 DIAGNOSIS — I200 Unstable angina: Secondary | ICD-10-CM | POA: Diagnosis not present

## 2017-07-24 DIAGNOSIS — Z951 Presence of aortocoronary bypass graft: Secondary | ICD-10-CM | POA: Diagnosis not present

## 2017-07-24 DIAGNOSIS — Z79899 Other long term (current) drug therapy: Secondary | ICD-10-CM | POA: Diagnosis not present

## 2017-07-24 DIAGNOSIS — Z7982 Long term (current) use of aspirin: Secondary | ICD-10-CM | POA: Diagnosis not present

## 2017-07-24 DIAGNOSIS — K219 Gastro-esophageal reflux disease without esophagitis: Secondary | ICD-10-CM | POA: Insufficient documentation

## 2017-07-24 DIAGNOSIS — I5042 Chronic combined systolic (congestive) and diastolic (congestive) heart failure: Secondary | ICD-10-CM | POA: Insufficient documentation

## 2017-07-24 DIAGNOSIS — Z96653 Presence of artificial knee joint, bilateral: Secondary | ICD-10-CM | POA: Diagnosis not present

## 2017-07-24 DIAGNOSIS — R42 Dizziness and giddiness: Secondary | ICD-10-CM | POA: Diagnosis not present

## 2017-07-24 DIAGNOSIS — R0602 Shortness of breath: Secondary | ICD-10-CM | POA: Diagnosis not present

## 2017-07-24 HISTORY — DX: Chest pain, unspecified: R07.9

## 2017-07-24 LAB — BASIC METABOLIC PANEL
ANION GAP: 10 (ref 5–15)
BUN: 15 mg/dL (ref 6–20)
CALCIUM: 9.1 mg/dL (ref 8.9–10.3)
CO2: 23 mmol/L (ref 22–32)
Chloride: 105 mmol/L (ref 101–111)
Creatinine, Ser: 1.04 mg/dL (ref 0.61–1.24)
Glucose, Bld: 96 mg/dL (ref 65–99)
Potassium: 3.8 mmol/L (ref 3.5–5.1)
SODIUM: 138 mmol/L (ref 135–145)

## 2017-07-24 LAB — CBC
HCT: 44.6 % (ref 39.0–52.0)
HEMOGLOBIN: 15.2 g/dL (ref 13.0–17.0)
MCH: 31.9 pg (ref 26.0–34.0)
MCHC: 34.1 g/dL (ref 30.0–36.0)
MCV: 93.7 fL (ref 78.0–100.0)
PLATELETS: 288 10*3/uL (ref 150–400)
RBC: 4.76 MIL/uL (ref 4.22–5.81)
RDW: 13.4 % (ref 11.5–15.5)
WBC: 9.2 10*3/uL (ref 4.0–10.5)

## 2017-07-24 LAB — LIPID PANEL
Cholesterol: 98 mg/dL (ref 0–200)
HDL: 37 mg/dL — AB (ref >40)
LDL CALC: 40 mg/dL (ref 0–99)
Total CHOL/HDL Ratio: 2.6 RATIO
Triglycerides: 107 mg/dL (ref <150)
VLDL: 21 mg/dL (ref 0–40)

## 2017-07-24 LAB — TROPONIN I: Troponin I: 0.1 ng/mL (ref <0.03)

## 2017-07-24 LAB — I-STAT TROPONIN, ED: TROPONIN I, POC: 0.05 ng/mL (ref 0.00–0.08)

## 2017-07-24 LAB — PROTIME-INR
INR: 0.95
Prothrombin Time: 12.6 seconds (ref 11.4–15.2)

## 2017-07-24 LAB — APTT: APTT: 30 s (ref 24–36)

## 2017-07-24 MED ORDER — HEPARIN (PORCINE) IN NACL 100-0.45 UNIT/ML-% IJ SOLN
1300.0000 [IU]/h | INTRAMUSCULAR | Status: DC
  Administered 2017-07-24: 1100 [IU]/h via INTRAVENOUS
  Filled 2017-07-24 (×2): qty 250

## 2017-07-24 MED ORDER — ZOLPIDEM TARTRATE 5 MG PO TABS
5.0000 mg | ORAL_TABLET | Freq: Every evening | ORAL | Status: DC | PRN
Start: 2017-07-24 — End: 2017-07-26

## 2017-07-24 MED ORDER — HEPARIN SODIUM (PORCINE) 5000 UNIT/ML IJ SOLN
4000.0000 [IU] | Freq: Once | INTRAMUSCULAR | Status: AC
  Administered 2017-07-24: 4000 [IU] via INTRAVENOUS
  Filled 2017-07-24: qty 1

## 2017-07-24 MED ORDER — ASPIRIN EC 81 MG PO TBEC
81.0000 mg | DELAYED_RELEASE_TABLET | Freq: Every day | ORAL | Status: DC
  Administered 2017-07-25: 81 mg via ORAL
  Filled 2017-07-24: qty 1

## 2017-07-24 MED ORDER — SODIUM CHLORIDE 0.9 % WEIGHT BASED INFUSION
1.0000 mL/kg/h | INTRAVENOUS | Status: DC
  Administered 2017-07-24: 1 mL/kg/h via INTRAVENOUS

## 2017-07-24 MED ORDER — NITROGLYCERIN IN D5W 200-5 MCG/ML-% IV SOLN
0.0000 ug/min | INTRAVENOUS | Status: DC
  Administered 2017-07-24: 5 ug/min via INTRAVENOUS
  Filled 2017-07-24: qty 250

## 2017-07-24 MED ORDER — SODIUM CHLORIDE 0.9 % IV SOLN
INTRAVENOUS | Status: DC
  Administered 2017-07-24: 20:00:00 via INTRAVENOUS

## 2017-07-24 MED ORDER — ONDANSETRON HCL 4 MG/2ML IJ SOLN: 4.0000 mg | Freq: Four times a day (QID) | INTRAMUSCULAR | Status: DC | PRN

## 2017-07-24 MED ORDER — ALPRAZOLAM 0.25 MG PO TABS: 0.2500 mg | ORAL_TABLET | Freq: Two times a day (BID) | ORAL | Status: DC | PRN

## 2017-07-24 MED ORDER — SODIUM CHLORIDE 0.9% FLUSH: 3.0000 mL | INTRAVENOUS | Status: DC | PRN

## 2017-07-24 MED ORDER — NITROGLYCERIN 0.4 MG SL SUBL: 0.4000 mg | SUBLINGUAL_TABLET | SUBLINGUAL | Status: DC | PRN

## 2017-07-24 MED ORDER — ACETAMINOPHEN 325 MG PO TABS
650.0000 mg | ORAL_TABLET | ORAL | Status: DC | PRN
  Administered 2017-07-25: 650 mg via ORAL
  Filled 2017-07-24: qty 2

## 2017-07-24 MED ORDER — ROSUVASTATIN CALCIUM 20 MG PO TABS: 40.0000 mg | ORAL_TABLET | Freq: Every day | ORAL | Status: DC

## 2017-07-24 MED ORDER — METOPROLOL SUCCINATE ER 25 MG PO TB24: 25.0000 mg | ORAL_TABLET | Freq: Every day | ORAL | Status: DC

## 2017-07-24 NOTE — H&P (Addendum)
Cardiology Admission History and Physical:   Patient ID: Scott Roth; MRN: [id number]; DOB: 02-10-1956   Admission date: 07/24/2017  Primary Care Provider: System, Pcp Not In Primary Cardiologist: Dr Tamala Julian  Chief Complaint:  Chest pain  Patient Profile:   Scott Roth is a 62 y.o. male with a history of   History of Present Illness:   Scott Roth is a 62 y.o. male with PMH of HTN, HLD, OSA not on CPAP and CAD s/p CABG x4 (LIMA to dLAD, free RIMA to OM1, SVG to D1, SVG to OM2) 06/16/2004 by Dr. Roxy Manns.  He had a history of PCI DES to prox LAD/1st diagonal in 2008. He had a patent LIMA to LAD, patent RIMA to OM1, occluded SVG to OM 2, and occluded SVG to D1. EF 50% at the time.  He did develop ventricular fibrillation during the cardiac catheterization requiring cardioversion.  The patient was seen by Truitt Merle on 07/09/2017 at which time he complained of intermittent shortness of breath for 1 month which occured with exertion and resolved quickly with rest. He underwent Myoview on 07/15/2017 which showed EF 33% with a medium defect of severe severity present in the apical anterior, apical lateral and apex location consistent with prior infarction, no ischemia present.  The report was reviewed by Dr. Tamala Julian. Given the new LV dysfunction, there was concern of previous heart attack, therefore it was recommended for the patient to undergo cardiac catheterization.  This was discussed with the patient by Dr. Tamala Julian and at an [place] with Almyra Deforest PA 07/18/17.  He was very hesitant to proceed with cardiac catheterization especially with his history of ventricular fibrillation during the last cardiac catheterization.  He wished to do a trial of medical therapy first. Mr Eulas Post adjusted hs medications, decreased his hydrochlorothiazide to 12.5 mg daily, added Toprol-XL 25 mg daily and 81 mg aspirin.  The pt walked into the office tonight at 5 pm complaining of chest pain-6/10. He was seen again by Mr  Eulas Post and his EKG was reviewed with the DOD in the office, no STEMI. It was decided to send the patient to the ED for further evaluation.    Past Medical History:  Diagnosis Date  . Arthritis   . CAD (coronary artery disease)   . Erectile dysfunction   . GERD (gastroesophageal reflux disease)   . Hyperlipidemia   . Hypertension   . Sleep apnea    does not wear mask  . Tubular adenoma     Past Surgical History:  Procedure Laterality Date  . 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 CORONARY ARTERY BYPASS GRAFT  2008  . EYE SURGERY Bilateral    Lasik  . KNEE ARTHROPLASTY Left   . SHOULDER SURGERY Right   . TOTAL KNEE ARTHROPLASTY Right 08/25/2014  . TOTAL KNEE ARTHROPLASTY Right 08/25/2014   Procedure: RIGHT TOTAL KNEE ARTHROPLASTY;  Surgeon: Kathryne Hitch, MD;  Location: [facility];  Service: Orthopedics;  Laterality: Right;     Medications Prior to Admission: Prior to Admission medications   Medication Sig Start Date End Date Taking? Authorizing Provider  aspirin EC 81 MG tablet Take 1 tablet (81 mg total) by mouth daily. 07/18/17   Almyra Deforest, PA  Coenzyme Q10 (CO Q-10) 300 MG CAPS Take 1 capsule by mouth daily. 04/01/14   Belva Crome, MD  Cyanocobalamin (B-12 PO) Take 1 tablet by mouth daily.  [provider]  hydrochlorothiazide (HYDRODIURIL) 12.5 MG tablet Take 1 tablet (12.5 mg total) by mouth daily. 07/18/17   Almyra Deforest, PA  losartan (COZAAR) 100 MG tablet Take 1 tablet (100 mg total) by mouth daily. 05/19/14   Belva Crome, MD  metoprolol succinate (TOPROL-XL) 25 MG 24 hr tablet Take 1 tablet (25 mg total) by mouth daily. 07/18/17   Almyra Deforest, PA  Multiple Vitamins-Minerals (MULTIVITAMIN PO) Take 1 tablet by mouth daily.     [provider]  Pyridoxine HCl (B-6 PO) Take 1 tablet by mouth daily.     [provider]  rosuvastatin (CRESTOR) 40 MG tablet Take 1 tablet (40 mg total) by mouth daily. 07/18/17  07/13/18  Almyra Deforest, PA  vitamin C (ASCORBIC ACID) 500 MG tablet Take 500 mg by mouth daily.    [provider]  VITAMIN E PO Take 1 tablet by mouth daily.     [provider]     Allergies:    Allergies  Allergen Reactions  . Penicillins Itching and Rash    Other reaction(s): Other (See Comments) Has patient had a PCN reaction causing immediate rash, facial/tongue/throat swelling, SOB or lightheadedness with hypotension: No Has patient had a PCN reaction causing severe rash involving mucus membranes or skin necrosis: No Has patient had a PCN reaction that required hospitalization: No Has patient had a PCN reaction occurring within the last 10 years: No If all of the above answers are "NO", then may proceed with Cephalosporin use.    Social History:   Social History   Socioeconomic History  . Marital status: Single    Spouse name: Not on file  . Number of children: Not on file  . Years of education: Not on file  . Highest education level: Not on file  Occupational History  . Not on file  Social Needs  . Financial resource strain: Not on file  . Food insecurity:    Worry: Not on file    Inability: Not on file  . Transportation needs:    Medical: Not on file    Non-medical: Not on file  Tobacco Use  . Smoking status: Never Smoker  . Smokeless tobacco: Never Used  Substance and Sexual Activity  . Alcohol use: No  . Drug use: No  . Sexual activity: Not on file  Lifestyle  . Physical activity:    Days per week: Not on file    Minutes per session: Not on file  . Stress: Not on file  Relationships  . Social connections:    Talks on phone: Not on file    Gets together: Not on file    Attends religious service: Not on file    Active member of club or organization: Not on file    Attends meetings of clubs or organizations: Not on file    Relationship status: Not on file  . Intimate partner violence:    Fear of current or ex partner: Not on file     Emotionally abused: Not on file    Physically abused: Not on file    Forced sexual activity: Not on file  Other Topics Concern  . Not on file  Social History Narrative  . Not on file    Family History:   The patient's family history includes Heart failure in his father; Hypertension in his mother.    ROS:  Please see the history of present illness.  All other ROS reviewed and negative.  Physical Exam/Data:   Vitals:   07/24/17 1845 07/24/17 1900  BP: 111/81 112/66  Pulse: 68 66  Resp: (!) 25 15  Temp: 98.1 F (36.7 C)   TempSrc: Oral   SpO2: 93% 94%   No intake or output data in the 24 hours ending 07/24/17 1952 There were no vitals filed for this visit. There is no height or weight on file to calculate BMI.  General:  Well nourished, well developed, in no acute distress HEENT: normal Lymph: no adenopathy Neck: no JVD Endocrine:  No thryomegaly Vascular: No carotid bruits; FA pulses 2+ bilaterally without bruits  Cardiac:  normal S1, S2; RRR; no murmur, CABG scar noted Lungs:  clear to auscultation bilaterally, no wheezing, rhonchi or rales  Abd: soft, nontender, no hepatomegaly  Ext: no edema Musculoskeletal:  No deformities, BUE and BLE strength normal and equal Skin: warm and dry  Neuro:  CNs 2-12 intact, no focal abnormalities noted Psych:  Normal affect    EKG:  The ECG that was done 07/24/17  was personally reviewed and demonstrates NSR 61, one PVC   Relevant CV Studies: Myoview 07/15/17-  Nuclear stress EF: 33%.  There was no ST segment deviation noted during stress.  Defect 1: There is a medium defect of severe severity present in the apical anterior, apical lateral and apex location.  Findings consistent with prior myocardial infarction.  This is a high risk study.  The left ventricular ejection fraction is moderately decreased (30-44%).   There is a medium size, irreversible defect in the apical anterior, lateral walls and in the true apex  consistent with prior infarct. The study is a high risk given severely decreased LVEF of 33%, but no ischemia is present.   Laboratory Data:  Chemistry Recent Labs  Lab 07/24/17 1856  NA 138  K 3.8  CL 105  CO2 23  GLUCOSE 96  BUN 15  CREATININE 1.04  CALCIUM 9.1  GFRNONAA >60  GFRAA >60  ANIONGAP 10    No results for input(s): PROT, ALBUMIN, AST, ALT, ALKPHOS, BILITOT in the last 168 hours. Hematology Recent Labs  Lab 07/24/17 1856  WBC 9.2  RBC 4.76  HGB 15.2  HCT 44.6  MCV 93.7  MCH 31.9  MCHC 34.1  RDW 13.4  PLT 288   Cardiac EnzymesNo results for input(s): TROPONINI in the last 168 hours.  Recent Labs  Lab 07/24/17 1916  TROPIPOC 0.05    BNPNo results for input(s): BNP, PROBNP in the last 168 hours.  DDimer No results for input(s): DDIMER in the last 168 hours.  Radiology/Studies:  Dg Chest 2 View  Result Date: 07/24/2017 CLINICAL DATA:  Chest pain EXAM: CHEST - 2 VIEW COMPARISON:  02/06/2008 chest radiograph. FINDINGS: Stable discontinuity in the upper most sternotomy wire. CABG clips overlie the mediastinum. Stable cardiomediastinal silhouette with normal heart size. No pneumothorax. No pleural effusion. Stable tiny granuloma in the upper right lung. No pulmonary edema. No acute consolidative airspace disease. IMPRESSION: No active cardiopulmonary disease. Electronically Signed   By: Ilona Sorrel M.D.   On: 07/24/2017 19:45    Assessment and Plan:   Chest pain with a moderate risk of ACS  CAD- CABG x 4 5093, PCI 2671 complicated by VF  Abnormal Myoview- new LVD  HTN  OSA- declines C-pap  HLD  Plan: Admit, start Heparin, cycle enzymes, cath in am.   Severity of Illness: The appropriate patient status for this patient is Admission. Admission status is judged to be  reasonable and necessary in order to provide the required intensity of service to ensure the patient's safety. The patient's presenting symptoms, physical exam findings, and initial  radiographic and laboratory data in the context of their medical condition is felt to place them at decreased risk for further clinical deterioration. Furthermore, it is anticipated that the patient will be medically stable for discharge from the hospital within 2 midnights of admission. The following factors support the patient status of observation.   " The patient's presenting symptoms include chest pain. Known CAD with high risk stress test.  " The physical exam findings include clear lungs. " The initial radiographic and laboratory data are negative.     For questions or updates, please contact [facility] Please consult [email or web address] for contact info under Cardiology/STEMI.    Signed, Kerin Ransom, PA-C  07/24/2017 7:52 PM   Personally seen and examined. Agree with above.  Chest pain has been off and on over the last several days. Both typical and atypical features. He did not CP starting after drinking coffee at one point. Worried about high risk NUC stress.   Exam: Alert, RRR, 2+ pulses, CTAB, soft abd, post CABG scar Labs: Trop neg thus far ECG: no obvious ischemic changes NUC stress: high risk, EF 33%. Old infarct pattern  A/P:  Unstable angina/CAD post CABG  - known CABG. See anatomy above.   - heparin IV. NTG  - cath tomorrow  - If no change, consider tx for GERD  Candee Furbish, MD

## 2017-07-24 NOTE — Plan of Care (Signed)
  Problem: Activity: Goal: Risk for activity intolerance will decrease Outcome: Completed/Met  Pt up ad lib. Ambulates independently with steady gait

## 2017-07-24 NOTE — ED Triage Notes (Signed)
Pt arrives to ED via EMS from cardiology office (Dr. Tamala Julian) with complaints of SOB worth with exertion. PT has stress test 1 week ago that had abnormalities. Over the last week he has experienced chest pressure. 4/10 pain  - 324mg  NTG - 2NTG and pain is now 1/10. PT has hx of CABG 2006.   #18 LFA 116/88 Hr 78 rr 20  spo2 94% RA

## 2017-07-24 NOTE — Telephone Encounter (Signed)
The patient came in as a walk in to speak with Medical Records. While here he complained of chest pain and pressure, 6/10.  He stated that he had recently seen Almyra Deforest, PA and had discussed a possible cardiac cath. The patient stated that his pain and pressure had gradually increased since that visit. He stated that he was actively having pain, 6/10.  DOD was consulted. An EKG was completed. EMS was called. The patient's blood pressure was 146/94. Heart rate was 71. 4 baby aspirin (81 mg) were given and one nitroglycerin.   The patient was transported to [facility] via EMS.

## 2017-07-24 NOTE — ED Notes (Signed)
Report attempted x 1

## 2017-07-24 NOTE — Telephone Encounter (Signed)
Left voicemail with patient to call back so I can get him copies of medical records like he requested.  Made my call back number available.

## 2017-07-24 NOTE — Telephone Encounter (Signed)
Note:  BP 146/94 HR 71 O2 sat 95%. Appears to be stable.   Physical exam:  Heart: RRR, no murmur Skin: warm General: not in distress. GI: soft Legs: no edema   Patient given 4 baby aspirin and 1 sublingual nitro in the clinic.  Hilbert Corrigan PA Pager: [phone]

## 2017-07-24 NOTE — ED Provider Notes (Signed)
[facility] EMERGENCY DEPARTMENT Provider Note   CSN: [id number] Arrival date & time: 07/24/17  1830     History   Chief Complaint Chief Complaint  Patient presents with  . Chest Pain    HPI Scott Roth is a 62 y.o. male.  HPI  Scott Roth is a 62 y.o. male, with a history of CAD, CABG, HTN,, presenting to the ED with chest pain. Patient states he had an abnormal stress test on April 1.  Cardiac cath was recommended at that time.  Patient states he delayed because he wanted to think about it and consider his options. Over the past week he had intermittent chest pain accompanied by shortness of breath, nausea, and lightheadedness.  Episodes became more frequent and more intense.  They then began to occur at rest.  Today constant pressure left chest, 5/10, nonradiating beginning while picking up records at his cardiologist office. Accompanied by intermittent shortness of breath, nausea, and lightheadedness. Given 324mg  ASA and 1 NTG in the cardiology office. Chest pain free presently.   Denies syncope, vomiting, diarrhea, fever, cough, peripheral edema, orthopnea, or any other complaints.  Cardiologist: Dr. Tamala Julian.  Past Medical History:  Diagnosis Date  . Arthritis   . CAD (coronary artery disease)   . Erectile dysfunction   . GERD (gastroesophageal reflux disease)   . Hyperlipidemia   . Hypertension   . Sleep apnea    does not wear mask  . Tubular adenoma     Patient Active Problem List   Diagnosis Date Noted  . Chest pain with moderate risk of acute coronary syndrome 07/24/2017  . DJD (degenerative joint disease) of knee 08/25/2014  . Nocturia 08/31/2013  . Sleep apnea 08/31/2013  . Hypertension   . Hyperlipidemia   . CAD (coronary artery disease) of artery bypass graft     Past Surgical History:  Procedure Laterality Date  . 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 CORONARY  ARTERY BYPASS GRAFT  2008  . EYE SURGERY Bilateral    Lasik  . KNEE ARTHROPLASTY Left   . SHOULDER SURGERY Right   . TOTAL KNEE ARTHROPLASTY Right 08/25/2014  . TOTAL KNEE ARTHROPLASTY Right 08/25/2014   Procedure: RIGHT TOTAL KNEE ARTHROPLASTY;  Surgeon: Kathryne Hitch, MD;  Location: [facility];  Service: Orthopedics;  Laterality: Right;        Home Medications    Prior to Admission medications   Medication Sig Start Date End Date Taking? Authorizing Provider  aspirin EC 81 MG tablet Take 1 tablet (81 mg total) by mouth daily. 07/18/17  Yes Almyra Deforest, PA  Coenzyme Q10 (CO Q-10) 300 MG CAPS Take 1 capsule by mouth daily. 04/01/14  Yes Belva Crome, MD  Cyanocobalamin (B-12 PO) Take 1 tablet by mouth daily.    Yes [provider]  GARLIC PO Take 1 tablet by mouth daily.   Yes [provider]  hydrochlorothiazide (HYDRODIURIL) 12.5 MG tablet Take 1 tablet (12.5 mg total) by mouth daily. 07/18/17  Yes Almyra Deforest, PA  MAGNESIUM PO Take 1 tablet by mouth daily.   Yes [provider]  metoprolol succinate (TOPROL-XL) 25 MG 24 hr tablet Take 1 tablet (25 mg total) by mouth daily. 07/18/17  Yes Almyra Deforest, PA  Multiple Vitamins-Minerals (MULTIVITAMIN PO) Take 1 tablet by mouth daily.    Yes [provider]  Pyridoxine HCl (B-6 PO) Take 1 tablet  by mouth daily.    Yes [provider]  Red Yeast Rice Extract (RED YEAST RICE PO) Take 1 tablet by mouth daily.   Yes [provider]  rosuvastatin (CRESTOR) 40 MG tablet Take 1 tablet (40 mg total) by mouth daily. 07/18/17 07/13/18 Yes Almyra Deforest, PA  TURMERIC PO Take 1 tablet by mouth daily.   Yes [provider]  vitamin C (ASCORBIC ACID) 500 MG tablet Take 500 mg by mouth daily.   Yes [provider]  VITAMIN E PO Take 1 tablet by mouth daily.    Yes [provider]  losartan (COZAAR) 100 MG tablet Take 1 tablet (100 mg total) by mouth daily. Patient not taking: Reported on 07/24/2017  05/19/14   Belva Crome, MD    Family History Family History  Problem Relation Age of Onset  . Heart failure Father   . Hypertension Mother     Social History Social History   Tobacco Use  . Smoking status: Never Smoker  . Smokeless tobacco: Never Used  Substance Use Topics  . Alcohol use: No  . Drug use: No     Allergies   Penicillins   Review of Systems Review of Systems  Constitutional: Negative for chills, diaphoresis and fever.  Respiratory: Positive for shortness of breath. Negative for cough.   Cardiovascular: Positive for chest pain. Negative for leg swelling.  Gastrointestinal: Positive for nausea. Negative for abdominal pain, diarrhea and vomiting.  Neurological: Positive for light-headedness. Negative for syncope.  All other systems reviewed and are negative.    Physical Exam Updated Vital Signs BP 111/81 (BP Location: Right Arm)   Pulse 68   Temp 98.1 F (36.7 C) (Oral)   Resp (!) 25   SpO2 93%   Physical Exam  Constitutional: He appears well-developed and well-nourished. No distress.  HENT:  Head: Normocephalic and atraumatic.  Eyes: Conjunctivae are normal.  Neck: Neck supple.  Cardiovascular: Normal rate, regular rhythm, normal heart sounds and intact distal pulses.  Pulmonary/Chest: Effort normal and breath sounds normal. No respiratory distress.  Abdominal: Soft. There is no tenderness. There is no guarding.  Musculoskeletal: He exhibits no edema.  Lymphadenopathy:    He has no cervical adenopathy.  Neurological: He is alert.  Skin: Skin is warm and dry. He is not diaphoretic.  Psychiatric: He has a normal mood and affect. His behavior is normal.  Nursing note and vitals reviewed.    ED Treatments / Results  Labs (all labs ordered are listed, but only abnormal results are displayed) Labs Reviewed  BASIC METABOLIC PANEL  CBC  PROTIME-INR  APTT  TROPONIN I  LIPID PANEL  HIV ANTIBODY (ROUTINE TESTING)  TROPONIN I  TROPONIN I    TROPONIN I  I-STAT TROPONIN, ED    EKG EKG Interpretation  Date/Time:  Wednesday July 24 2017 18:45:46 EDT Ventricular Rate:  61 PR Interval:    QRS Duration: 94 QT Interval:  406 QTC Calculation: 409 R Axis:   37 Text Interpretation:  Sinus rhythm Ventricular premature complex Low voltage, precordial leads Since last tracing pvc is new Confirmed by Daleen Bo [phone]) on 07/24/2017 7:17:33 PM   Radiology Dg Chest 2 View  Result Date: 07/24/2017 CLINICAL DATA:  Chest pain EXAM: CHEST - 2 VIEW COMPARISON:  02/06/2008 chest radiograph. FINDINGS: Stable discontinuity in the upper most sternotomy wire. CABG clips overlie the mediastinum. Stable cardiomediastinal silhouette with normal heart size. No pneumothorax. No pleural effusion. Stable tiny granuloma in the upper right  lung. No pulmonary edema. No acute consolidative airspace disease. IMPRESSION: No active cardiopulmonary disease. Electronically Signed   By: Ilona Sorrel M.D.   On: 07/24/2017 19:45    Procedures .Critical Care Performed by: Lorayne Bender, PA-C Authorized by: Lorayne Bender, PA-C   Critical care provider statement:    Critical care time (minutes):  35   Critical care time was exclusive of:  Separately billable procedures and treating other patients   Critical care was necessary to treat or prevent imminent or life-threatening deterioration of the following conditions: ACS/unstable angina.   Critical care was time spent personally by me on the following activities:  Development of treatment plan with patient or surrogate, discussions with consultants, examination of patient, obtaining history from patient or surrogate, review of old charts, re-evaluation of patient's condition, pulse oximetry, ordering and review of radiographic studies, ordering and review of laboratory studies and ordering and performing treatments and interventions   I assumed direction of critical care for this patient from another provider in  my specialty: no     (including critical care time)  Medications Ordered in ED Medications  0.9 %  sodium chloride infusion (has no administration in time range)  heparin injection 4,000 Units (has no administration in time range)  nitroGLYCERIN 50 mg in dextrose 5 % 250 mL (0.2 mg/mL) infusion (has no administration in time range)  aspirin EC tablet 81 mg (has no administration in time range)  nitroGLYCERIN (NITROSTAT) SL tablet 0.4 mg (has no administration in time range)  acetaminophen (TYLENOL) tablet 650 mg (has no administration in time range)  ondansetron (ZOFRAN) injection 4 mg (has no administration in time range)  sodium chloride flush (NS) 0.9 % injection 3 mL (has no administration in time range)  ALPRAZolam (XANAX) tablet 0.25 mg (has no administration in time range)  zolpidem (AMBIEN) tablet 5 mg (has no administration in time range)  metoprolol succinate (TOPROL-XL) 24 hr tablet 25 mg (has no administration in time range)  rosuvastatin (CRESTOR) tablet 40 mg (has no administration in time range)     Initial Impression / Assessment and Plan / ED Course  I have reviewed the triage vital signs and the nursing notes.  Pertinent labs & imaging results that were available during my care of the patient were reviewed by me and considered in my medical decision making (see chart for details).  Clinical Course as of Jul 25 2019  Wed Jul 24, 2017  1944 Spoke with Dr. Marlou Porch, cardiologist.  States he will admit the patient.  Agrees with initiating ACS treatment.   [SJ]    Clinical Course User Index [SJ] Jaequan Propes C, PA-C    Patient presents with intermittent chest pain, worsening over the last week.  Pain began to occur at rest.  Suspect unstable angina.  Appropriate treatment initiated and patient admitted to cardiology service.  Findings and plan of care discussed with Daleen Bo, MD.   Vitals:   07/24/17 1845 07/24/17 1900  BP: 111/81 112/66  Pulse: 68 66  Resp:  (!) 25 15  Temp: 98.1 F (36.7 C)   TempSrc: Oral   SpO2: 93% 94%    Triple bypass CABG performed 2006.   Results of cardiac cath in 2008: He had a history of PCI DES to prox LAD/1st diagonal in 2008, patent LIMA to LAD, patent RIMA to OM1, occluded SVG to OM 2, SVG to D1 was occluded. EF 50% at the time.   Final Clinical Impressions(s) / ED Diagnoses  Final diagnoses:  Unstable angina [facility])    ED Discharge Orders    None       Layla Maw 07/24/17 2021    Daleen Bo, MD 07/25/17 1450

## 2017-07-24 NOTE — Progress Notes (Signed)
ANTICOAGULATION CONSULT NOTE - Initial Consult  Pharmacy Consult for heparin Indication: chest pain/ACS  Allergies  Allergen Reactions  . Penicillins Itching and Rash    Other reaction(s): Other (See Comments) Has patient had a PCN reaction causing immediate rash, facial/tongue/throat swelling, SOB or lightheadedness with hypotension: No Has patient had a PCN reaction causing severe rash involving mucus membranes or skin necrosis: No Has patient had a PCN reaction that required hospitalization: No Has patient had a PCN reaction occurring within the last 10 years: No If all of the above answers are "NO", then may proceed with Cephalosporin use.    Patient Measurements:   Heparin Dosing Weight: 85 kg  Vital Signs:66 Temp: 98.1 F (36.7 C) (04/10 1845) Temp Source: Oral (04/10 1845) BP: 112/66 (04/10 1900) Pulse Rate: 66 (04/10 1900)  Labs: Recent Labs    07/24/17 1856  HGB 15.2  HCT 44.6  PLT 288  CREATININE 1.04    Estimated Creatinine Clearance: 79.7 mL/min (by C-G formula based on SCr of 1.04 mg/dL).   Medical History: Past Medical History:  Diagnosis Date  . Arthritis   . CAD (coronary artery disease)   . Erectile dysfunction   . GERD (gastroesophageal reflux disease)   . Hyperlipidemia   . Hypertension   . Sleep apnea    does not wear mask  . Tubular adenoma     Medications:  Scheduled:  . [START ON 07/25/2017] aspirin EC  81 mg Oral Daily  . [START ON 07/25/2017] metoprolol succinate  25 mg Oral Daily  . [START ON 07/25/2017] rosuvastatin  40 mg Oral q1800    Assessment: 62 yo male presented to the ED on 4/10 with chest pain. Past cardiac history significant for CAD s/p CABG x4 in 2006 and PCI DES in 2008. 07/15/17 EF 33%.  Starting heparin for ACS. Plts 288. No PTA anticoagulation.  Goal of Therapy:  Heparin level 0.3-0.7 units/ml Monitor platelets by anticoagulation protocol: Yes   Plan:  Give 4,000 units bolus x 1 Start heparin infusion at  1,100 units/hr Check anti-Xa level in 6 hours and daily while on heparin Continue to monitor H&H and platelets  Lulamae Skorupski A Yanci Bachtell 07/24/2017,8:31 PM

## 2017-07-24 NOTE — Telephone Encounter (Signed)
Scott Roth is well known to me, I last saw the patient on 07/18/2017 for abnormal myoview and recommended catheterization, however he was hesitant to proceed due to prior h/o vtach during the last cardiac catheterization. He walked into the [facility] clinic today with active 6/10 chest pain. EKG obtained did not show STEMI or ST-T wave changes. Reviewed with DOD Dr. Gwenlyn Found as well. Plan to transport patient to [facility] via EMS and potentially undergo cardiac catheterization for definitive diagnosis. The patient's primary cardiologist Dr. Tamala Julian has been notified. ED physician to contact cardiology after initial workup.  Hilbert Corrigan PA Pager: [phone]

## 2017-07-25 ENCOUNTER — Encounter (HOSPITAL_COMMUNITY): Payer: Self-pay | Admitting: Cardiovascular Disease

## 2017-07-25 ENCOUNTER — Encounter (HOSPITAL_COMMUNITY)
Admission: EM | Disposition: A | Discharge status: Home / Self Care | Payer: Self-pay | Source: Home / Self Care | Attending: Emergency Medicine

## 2017-07-25 DIAGNOSIS — I2582 Chronic total occlusion of coronary artery: Secondary | ICD-10-CM | POA: Diagnosis not present

## 2017-07-25 DIAGNOSIS — I10 Essential (primary) hypertension: Secondary | ICD-10-CM | POA: Diagnosis not present

## 2017-07-25 DIAGNOSIS — I5042 Chronic combined systolic (congestive) and diastolic (congestive) heart failure: Secondary | ICD-10-CM | POA: Diagnosis not present

## 2017-07-25 DIAGNOSIS — I5023 Acute on chronic systolic (congestive) heart failure: Secondary | ICD-10-CM

## 2017-07-25 DIAGNOSIS — Z7982 Long term (current) use of aspirin: Secondary | ICD-10-CM | POA: Diagnosis not present

## 2017-07-25 DIAGNOSIS — Z79899 Other long term (current) drug therapy: Secondary | ICD-10-CM | POA: Diagnosis not present

## 2017-07-25 DIAGNOSIS — Z955 Presence of coronary angioplasty implant and graft: Secondary | ICD-10-CM | POA: Diagnosis not present

## 2017-07-25 DIAGNOSIS — R079 Chest pain, unspecified: Secondary | ICD-10-CM | POA: Diagnosis not present

## 2017-07-25 DIAGNOSIS — Z880 Allergy status to penicillin: Secondary | ICD-10-CM | POA: Diagnosis not present

## 2017-07-25 DIAGNOSIS — Z951 Presence of aortocoronary bypass graft: Secondary | ICD-10-CM | POA: Diagnosis not present

## 2017-07-25 DIAGNOSIS — E785 Hyperlipidemia, unspecified: Secondary | ICD-10-CM | POA: Diagnosis not present

## 2017-07-25 DIAGNOSIS — I200 Unstable angina: Secondary | ICD-10-CM | POA: Diagnosis not present

## 2017-07-25 DIAGNOSIS — I25110 Atherosclerotic heart disease of native coronary artery with unstable angina pectoris: Secondary | ICD-10-CM | POA: Diagnosis not present

## 2017-07-25 DIAGNOSIS — Z96653 Presence of artificial knee joint, bilateral: Secondary | ICD-10-CM | POA: Diagnosis not present

## 2017-07-25 DIAGNOSIS — I110 Hypertensive heart disease with heart failure: Secondary | ICD-10-CM | POA: Diagnosis not present

## 2017-07-25 DIAGNOSIS — K219 Gastro-esophageal reflux disease without esophagitis: Secondary | ICD-10-CM | POA: Diagnosis not present

## 2017-07-25 DIAGNOSIS — I25810 Atherosclerosis of coronary artery bypass graft(s) without angina pectoris: Secondary | ICD-10-CM | POA: Diagnosis not present

## 2017-07-25 DIAGNOSIS — I255 Ischemic cardiomyopathy: Secondary | ICD-10-CM | POA: Diagnosis not present

## 2017-07-25 HISTORY — PX: LEFT HEART CATH AND CORS/GRAFTS ANGIOGRAPHY: CATH118250

## 2017-07-25 LAB — CBC
HCT: 43.9 % (ref 39.0–52.0)
HEMOGLOBIN: 14.9 g/dL (ref 13.0–17.0)
MCH: 31.8 pg (ref 26.0–34.0)
MCHC: 33.9 g/dL (ref 30.0–36.0)
MCV: 93.6 fL (ref 78.0–100.0)
Platelets: 259 10*3/uL (ref 150–400)
RBC: 4.69 MIL/uL (ref 4.22–5.81)
RDW: 13.5 % (ref 11.5–15.5)
WBC: 8.4 10*3/uL (ref 4.0–10.5)

## 2017-07-25 LAB — MRSA PCR SCREENING: MRSA BY PCR: NEGATIVE

## 2017-07-25 LAB — HEPARIN LEVEL (UNFRACTIONATED): Heparin Unfractionated: 0.21 IU/mL — ABNORMAL LOW (ref 0.30–0.70)

## 2017-07-25 LAB — HIV ANTIBODY (ROUTINE TESTING W REFLEX): HIV Screen 4th Generation wRfx: NONREACTIVE

## 2017-07-25 LAB — TROPONIN I
TROPONIN I: 0.08 ng/mL — AB (ref <0.03)
Troponin I: 0.09 ng/mL (ref <0.03)

## 2017-07-25 SURGERY — LEFT HEART CATH AND CORS/GRAFTS ANGIOGRAPHY
Anesthesia: LOCAL

## 2017-07-25 MED ORDER — ASPIRIN EC 81 MG PO TBEC
81.0000 mg | DELAYED_RELEASE_TABLET | Freq: Every day | ORAL | Status: DC
  Administered 2017-07-26: 81 mg via ORAL
  Filled 2017-07-25: qty 1

## 2017-07-25 MED ORDER — ONDANSETRON HCL 4 MG/2ML IJ SOLN: 4.0000 mg | Freq: Four times a day (QID) | INTRAMUSCULAR | Status: DC | PRN

## 2017-07-25 MED ORDER — FUROSEMIDE 10 MG/ML IJ SOLN
40.0000 mg | Freq: Once | INTRAMUSCULAR | Status: AC
  Administered 2017-07-25: 40 mg via INTRAVENOUS
  Filled 2017-07-25: qty 4

## 2017-07-25 MED ORDER — IOPAMIDOL (ISOVUE-370) INJECTION 76%
INTRAVENOUS | Status: AC
  Filled 2017-07-25: qty 125

## 2017-07-25 MED ORDER — SODIUM CHLORIDE 0.9% FLUSH: 3.0000 mL | INTRAVENOUS | Status: DC | PRN

## 2017-07-25 MED ORDER — HEPARIN (PORCINE) IN NACL 2-0.9 UNIT/ML-% IJ SOLN
INTRAMUSCULAR | Status: AC | PRN
  Administered 2017-07-25 (×2): 500 mL

## 2017-07-25 MED ORDER — ACETAMINOPHEN 325 MG PO TABS: 650.0000 mg | ORAL_TABLET | ORAL | Status: DC | PRN

## 2017-07-25 MED ORDER — ISOSORBIDE MONONITRATE ER 30 MG PO TB24
30.0000 mg | ORAL_TABLET | Freq: Every day | ORAL | Status: DC
  Administered 2017-07-25 – 2017-07-26 (×3): 30 mg via ORAL
  Filled 2017-07-25 (×2): qty 1

## 2017-07-25 MED ORDER — METOPROLOL SUCCINATE ER 25 MG PO TB24
25.0000 mg | ORAL_TABLET | Freq: Every day | ORAL | Status: DC
  Administered 2017-07-25: 25 mg via ORAL
  Filled 2017-07-25: qty 1

## 2017-07-25 MED ORDER — SODIUM CHLORIDE 0.9 % IV SOLN: INTRAVENOUS | Status: AC

## 2017-07-25 MED ORDER — ASPIRIN 81 MG PO CHEW: 81.0000 mg | CHEWABLE_TABLET | Freq: Every day | ORAL | Status: DC

## 2017-07-25 MED ORDER — LIDOCAINE HCL 1 % IJ SOLN
INTRAMUSCULAR | Status: AC
  Filled 2017-07-25: qty 20

## 2017-07-25 MED ORDER — LIDOCAINE HCL (PF) 1 % IJ SOLN
INTRAMUSCULAR | Status: DC | PRN
  Administered 2017-07-25: 18 mL via INTRADERMAL

## 2017-07-25 MED ORDER — IOPAMIDOL (ISOVUE-370) INJECTION 76%
INTRAVENOUS | Status: DC | PRN
  Administered 2017-07-25: 105 mL via INTRA_ARTERIAL

## 2017-07-25 MED ORDER — SACUBITRIL-VALSARTAN 24-26 MG PO TABS
1.0000 | ORAL_TABLET | Freq: Two times a day (BID) | ORAL | Status: DC
  Administered 2017-07-25 (×2): 1 via ORAL
  Filled 2017-07-25 (×3): qty 1

## 2017-07-25 MED ORDER — FUROSEMIDE 40 MG PO TABS
40.0000 mg | ORAL_TABLET | Freq: Every day | ORAL | Status: DC
  Administered 2017-07-25 – 2017-07-26 (×3): 40 mg via ORAL
  Filled 2017-07-25 (×2): qty 1

## 2017-07-25 MED ORDER — SODIUM CHLORIDE 0.9% FLUSH
3.0000 mL | Freq: Two times a day (BID) | INTRAVENOUS | Status: DC
  Administered 2017-07-25: 3 mL via INTRAVENOUS

## 2017-07-25 MED ORDER — ROSUVASTATIN CALCIUM 20 MG PO TABS
40.0000 mg | ORAL_TABLET | Freq: Every day | ORAL | Status: DC
  Administered 2017-07-25: 40 mg via ORAL
  Filled 2017-07-25 (×2): qty 2

## 2017-07-25 MED ORDER — METOPROLOL SUCCINATE ER 50 MG PO TB24
50.0000 mg | ORAL_TABLET | Freq: Every day | ORAL | Status: DC
  Administered 2017-07-26 (×2): 50 mg via ORAL
  Filled 2017-07-25: qty 1

## 2017-07-25 MED ORDER — MORPHINE SULFATE (PF) 2 MG/ML IV SOLN: 2.0000 mg | INTRAVENOUS | Status: DC | PRN

## 2017-07-25 MED ORDER — HYDROCHLOROTHIAZIDE 25 MG PO TABS
12.5000 mg | ORAL_TABLET | Freq: Every day | ORAL | Status: DC
  Administered 2017-07-25: 12.5 mg via ORAL
  Filled 2017-07-25: qty 1

## 2017-07-25 MED ORDER — HEPARIN (PORCINE) IN NACL 2-0.9 UNIT/ML-% IJ SOLN
INTRAMUSCULAR | Status: AC
  Filled 2017-07-25: qty 1000

## 2017-07-25 MED ORDER — SODIUM CHLORIDE 0.9 % IV SOLN: 250.0000 mL | INTRAVENOUS | Status: DC | PRN

## 2017-07-25 SURGICAL SUPPLY — 11 items
CATH INFINITI 5FR MULTPACK ANG (CATHETERS) ×2 IMPLANT
DEVICE CLOSURE MYNXGRIP 5F (Vascular Products) ×2 IMPLANT
ELECT DEFIB PAD ADLT CADENCE (PAD) ×2 IMPLANT
KIT HEART LEFT (KITS) ×2 IMPLANT
PACK CARDIAC CATHETERIZATION (CUSTOM PROCEDURE TRAY) ×2 IMPLANT
SHEATH AVANTI 11CM 5FR (SHEATH) ×2 IMPLANT
SYR MEDRAD MARK V 150ML (SYRINGE) ×2 IMPLANT
TRANSDUCER W/STOPCOCK (MISCELLANEOUS) ×2 IMPLANT
TUBING CIL FLEX 10 FLL-RA (TUBING) ×2 IMPLANT
WIRE HI TORQ VERSACORE-J 145CM (WIRE) ×2 IMPLANT
WIRE J 3MM .035X145CM (WIRE) ×2 IMPLANT

## 2017-07-25 NOTE — Interval H&P Note (Signed)
Cath Lab Visit (complete for each Cath Lab visit)  Clinical Evaluation Leading to the Procedure:   ACS: Yes.    Non-ACS:    Anginal Classification: CCS III  Anti-ischemic medical therapy: Maximal Therapy (2 or more classes of medications)  Non-Invasive Test Results: Intermediate-risk stress test findings: cardiac mortality 1-3%/year  Prior CABG: Previous CABG      History and Physical Interval Note:  07/25/2017 7:38 AM  Scott Roth  has presented today for surgery, with the diagnosis of NSTEMI  The various methods of treatment have been discussed with the patient and family. After consideration of risks, benefits and other options for treatment, the patient has consented to  Procedure(s): LEFT HEART CATH AND CORS/GRAFTS ANGIOGRAPHY (N/A) as a surgical intervention .  The patient's history has been reviewed, patient examined, no change in status, stable for surgery.  I have reviewed the patient's chart and labs.  Questions were answered to the patient's satisfaction.     Scott Roth

## 2017-07-25 NOTE — Progress Notes (Addendum)
Progress Note  Patient Name: Scott Roth Date of Encounter: 07/25/2017  Primary Cardiologist: Daneen Schick  Subjective   Admitted yesterday with symptoms worrisome for progressive angina. He had been seen in the office over the past several weeks with worrisome symptoms and coronary angiography was offered but he refused because of prior procedure associated VF. Because of ongoing symptoms he was sent to the emergency room and admitted.  Recent Myoview was "high risk" with estimated ejection fraction of 33%. Troponin was mildly elevated at 0.08.  Inpatient Medications    Scheduled Meds: . [MAR Hold] aspirin EC  81 mg Oral Daily  . [MAR Hold] metoprolol succinate  25 mg Oral Daily  . [MAR Hold] rosuvastatin  40 mg Oral q1800   Continuous Infusions: . sodium chloride 20 mL/hr at 07/25/17 0622  . sodium chloride 1 mL/kg/hr (07/25/17 0622)  . heparin Stopped (07/25/17 3710)  . heparin    . [MAR Hold] nitroGLYCERIN 5 mcg/min (07/25/17 0622)   PRN Meds: [MAR Hold] acetaminophen, [MAR Hold] ALPRAZolam, heparin, lidocaine (PF), [MAR Hold] nitroGLYCERIN, [MAR Hold] ondansetron (ZOFRAN) IV, [MAR Hold] sodium chloride flush, [MAR Hold] zolpidem   Vital Signs    Vitals:   07/24/17 2100 07/24/17 2139 07/25/17 0548 07/25/17 0730  BP: 131/80 (!) 140/96 (!) 143/99   Pulse: 80 79 71   Resp: 20  15   Temp:  98 F (36.7 C) 98.6 F (37 C)   TempSrc:  Oral Oral   SpO2: 92% 92% 97% 96%  Weight:  199 lb 8 oz (90.5 kg) 199 lb 11.2 oz (90.6 kg)   Height:  5\' 7"  (1.702 m)      Intake/Output Summary (Last 24 hours) at 07/25/2017 0759 Last data filed at 07/25/2017 0622 Gross per 24 hour  Intake 1238.26 ml  Output -  Net 1238.26 ml   Filed Weights   07/24/17 2139 07/25/17 0548  Weight: 199 lb 8 oz (90.5 kg) 199 lb 11.2 oz (90.6 kg)    Telemetry    Normal sinus rhythm- Personally Reviewed  ECG    Normal sinus rhythm without ischemic abnormality. Low voltage.- Personally  Reviewed  Physical Exam   GEN: No acute distress.   Neck: No JVD Cardiac: RRR, no murmurs, rubs, or gallops.  Respiratory: Clear to auscultation bilaterally. GI: Soft, nontender, non-distended  MS: No edema; No deformity. Neuro:  Nonfocal  Psych: Normal affect   Labs    Chemistry Recent Labs  Lab 07/24/17 1856  NA 138  K 3.8  CL 105  CO2 23  GLUCOSE 96  BUN 15  CREATININE 1.04  CALCIUM 9.1  GFRNONAA >60  GFRAA >60  ANIONGAP 10     Hematology Recent Labs  Lab 07/24/17 1856  WBC 9.2  RBC 4.76  HGB 15.2  HCT 44.6  MCV 93.7  MCH 31.9  MCHC 34.1  RDW 13.4  PLT 288    Cardiac Enzymes Recent Labs  Lab 07/24/17 2023 07/25/17 0224  TROPONINI 0.10* 0.08*    Recent Labs  Lab 07/24/17 1916  TROPIPOC 0.05     BNPNo results for input(s): BNP, PROBNP in the last 168 hours.   DDimer No results for input(s): DDIMER in the last 168 hours.   Radiology    Dg Chest 2 View  Result Date: 07/24/2017 CLINICAL DATA:  Chest pain EXAM: CHEST - 2 VIEW COMPARISON:  02/06/2008 chest radiograph. FINDINGS: Stable discontinuity in the upper most sternotomy wire. CABG clips overlie the mediastinum. Stable cardiomediastinal silhouette  with normal heart size. No pneumothorax. No pleural effusion. Stable tiny granuloma in the upper right lung. No pulmonary edema. No acute consolidative airspace disease. IMPRESSION: No active cardiopulmonary disease. Electronically Signed   By: Ilona Sorrel M.D.   On: 07/24/2017 19:45    Cardiac Studies   Stress myocardial perfusion study for/04/2017: Study Highlights      Nuclear stress EF: 33%.  There was no ST segment deviation noted during stress.  Defect 1: There is a medium defect of severe severity present in the apical anterior, apical lateral and apex location.  Findings consistent with prior myocardial infarction.  This is a high risk study.  The left ventricular ejection fraction is moderately decreased (30-44%).   There  is a medium size, irreversible defect in the apical anterior, lateral walls and in the true apex consistent with prior infarct. The study is a high risk given severely decreased LVEF of 33%, but no ischemia is present.      Patient Profile     62 y.o. male with history of CAD, bypass surgery 2006 (with LIMA to LAD, free RIMA to OM, SVG to diagonal, and SVG to OM), DES to LAD/first diagonal 2008, conjugated by ventricular fibrillation during the procedure, hyperlipidemia, obstructive sleep apnea, and metabolic syndrome. Recent abnormal myocardial perfusion study graded as high risk.  Assessment & Plan    1. 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 Optimize heart failure therapy. Start Entresto and change metoprolol to carvedilol. Optimize diuretic therapy given the patient's very high left ventricular end-diastolic pressure. We'll likely need a loop diuretic 2. Coronary atherosclerotic heart disease, prior coronary bypass grafting and stenting. No significant change in coronary anatomy compared with post stent imaging 2008. Add long-acting nitrates. 3. Essential hypertension, poorly controlled.  Will make changes in medications, likely discharge later today or in a.m. Check BMET in a.m.  For questions or updates, please contact [facility] Please consult [email or web address] for contact info under Cardiology/STEMI.      Signed, Sinclair Grooms, MD  07/25/2017, 7:59 AM

## 2017-07-26 ENCOUNTER — Telehealth: Payer: Self-pay | Admitting: Interventional Cardiology

## 2017-07-26 ENCOUNTER — Other Ambulatory Visit (INDEPENDENT_AMBULATORY_CARE_PROVIDER_SITE_OTHER): Payer: BLUE CROSS/BLUE SHIELD | Admitting: *Deleted

## 2017-07-26 DIAGNOSIS — I10 Essential (primary) hypertension: Secondary | ICD-10-CM | POA: Diagnosis not present

## 2017-07-26 DIAGNOSIS — I255 Ischemic cardiomyopathy: Secondary | ICD-10-CM | POA: Diagnosis not present

## 2017-07-26 DIAGNOSIS — R079 Chest pain, unspecified: Secondary | ICD-10-CM

## 2017-07-26 DIAGNOSIS — I200 Unstable angina: Secondary | ICD-10-CM | POA: Diagnosis not present

## 2017-07-26 DIAGNOSIS — I25810 Atherosclerosis of coronary artery bypass graft(s) without angina pectoris: Secondary | ICD-10-CM

## 2017-07-26 DIAGNOSIS — I5023 Acute on chronic systolic (congestive) heart failure: Secondary | ICD-10-CM | POA: Diagnosis not present

## 2017-07-26 LAB — BASIC METABOLIC PANEL
ANION GAP: 10 (ref 5–15)
BUN: 18 mg/dL (ref 6–20)
CALCIUM: 9.1 mg/dL (ref 8.9–10.3)
CO2: 23 mmol/L (ref 22–32)
CREATININE: 0.98 mg/dL (ref 0.61–1.24)
Chloride: 101 mmol/L (ref 101–111)
GFR calc Af Amer: >60 mL/min (ref >60)
GFR calc non Af Amer: >60 mL/min (ref >60)
GLUCOSE: 103 mg/dL — AB (ref 65–99)
Potassium: 3.6 mmol/L (ref 3.5–5.1)
Sodium: 134 mmol/L — ABNORMAL LOW (ref 135–145)

## 2017-07-26 LAB — CBC
HEMATOCRIT: 46 % (ref 39.0–52.0)
Hemoglobin: 15.8 g/dL (ref 13.0–17.0)
MCH: 32 pg (ref 26.0–34.0)
MCHC: 34.3 g/dL (ref 30.0–36.0)
MCV: 93.3 fL (ref 78.0–100.0)
PLATELETS: 278 10*3/uL (ref 150–400)
RBC: 4.93 MIL/uL (ref 4.22–5.81)
RDW: 13.5 % (ref 11.5–15.5)
WBC: 9 10*3/uL (ref 4.0–10.5)

## 2017-07-26 MED ORDER — FUROSEMIDE 40 MG PO TABS: 40.0000 mg | ORAL_TABLET | Freq: Every day | ORAL | 1 refills | Status: DC

## 2017-07-26 MED ORDER — SACUBITRIL-VALSARTAN 24-26 MG PO TABS: 1.0000 | ORAL_TABLET | Freq: Two times a day (BID) | ORAL | 1 refills | Status: DC

## 2017-07-26 MED ORDER — NITROGLYCERIN 0.4 MG SL SUBL: 0.4000 mg | SUBLINGUAL_TABLET | SUBLINGUAL | 1 refills | Status: DC | PRN

## 2017-07-26 MED ORDER — ISOSORBIDE MONONITRATE ER 30 MG PO TB24: 30.0000 mg | ORAL_TABLET | Freq: Every day | ORAL | 1 refills | Status: DC

## 2017-07-26 MED ORDER — METOPROLOL SUCCINATE ER 50 MG PO TB24: 50.0000 mg | ORAL_TABLET | Freq: Every day | ORAL | 0 refills | Status: DC

## 2017-07-26 MED FILL — Lidocaine HCl Local Inj 1%: INTRAMUSCULAR | Qty: 20 | Status: AC

## 2017-07-26 MED FILL — Heparin Sodium (Porcine) 2 Unit/ML in Sodium Chloride 0.9%: INTRAMUSCULAR | Qty: 1000 | Status: AC

## 2017-07-26 NOTE — Telephone Encounter (Signed)
Pt discharged 4/12.  Will plan to call 4/15.

## 2017-07-26 NOTE — Telephone Encounter (Signed)
New message  Scheduled with Harlan Stains  [facility] appt made with [place] on 08/08/17 at 10:30

## 2017-07-26 NOTE — Progress Notes (Signed)
EKG order placed from 07/24/17

## 2017-07-26 NOTE — Discharge Summary (Addendum)
The patient has been seen in conjunction with Reino Bellis, NP. All aspects of care have been considered and discussed. The patient has been personally interviewed, examined, and all clinical data has been reviewed.   Doing well this morning, able to lie flat, denies chest pain, denies access site pain and hematoma.  Diastolic blood pressure above 90 mmHg.  Neck veins are flat.  GI exam unremarkable  Elevated troponin is felt to be related to demand.  Chronic combined systolic and diastolic heart failure on the basis of coronary artery disease/ischemic cardiomyopathy.  Uptitrate heart failure therapy: Losartan has been changed to [employer].  Metoprolol succinate has been increased.  More aggressive diuretic therapy has been prescribed.  Will need 1 week follow-up basic metabolic panel.  Discharge Summary    Patient ID: Scott Roth,  MRN: [id number], DOB/AGE: Jun 19, 1955 62 y.o.  Admit date: 07/24/2017 Discharge date: 07/26/2017  Primary Care Provider: System, Pcp Not In Primary Cardiologist: [facility]   Discharge Diagnoses    Active Problems:   Chest pain with moderate risk of acute coronary syndrome   Unstable angina (HCC)   Allergies Allergies  Allergen Reactions  . Penicillins Itching and Rash    Other reaction(s): Other (See Comments) Has patient had a PCN reaction causing immediate rash, facial/tongue/throat swelling, SOB or lightheadedness with hypotension: No Has patient had a PCN reaction causing severe rash involving mucus membranes or skin necrosis: No Has patient had a PCN reaction that required hospitalization: No Has patient had a PCN reaction occurring within the last 10 years: No If all of the above answers are "NO", then may proceed with Cephalosporin use.    Diagnostic Studies/Procedures    Cath: 07/25/17  Conclusion     Prox Cx lesion is 100% stenosed.  Ost LAD to Prox LAD lesion is 50% stenosed.  Previously placed Ost 1st Diag stent  (unknown type) is widely patent.  Previously placed Prox LAD stent (unknown type) is widely patent.  Prox LAD to Mid LAD lesion is 95% stenosed.  Origin to Prox Graft lesion is 100% stenosed.  There is moderate to severe left ventricular systolic dysfunction.  LV end diastolic pressure is moderately elevated.  The left ventricular ejection fraction is 35-45% by visual estimate.     IMPRESSION: Mr Corales anatomy is similar to what it was back in 2008 when Dr. Tamala Julian performed LAD diagonal branch stenting. His LIMA to the LAD is patent. His free RIMA to the obtuse marginal branch is patent. His vein to diagonal branch is occluded. The Y graft to the second marginal graft is occluded as it was 11 years ago. His LV function is significantly reduced compared to prior assessments melena 35% range for unclear reasons. I have reviewed his films with Dr. Tamala Julian. Medical therapy will be pursued. A MYNX device was used to achieve hemostasis.  [name]. MD, [facility] _____________   History of Present Illness     62 y.o.malewith PMH of HTN, HLD, OSA not on CPAP and CAD s/p CABG x4 (LIMA to dLAD, free RIMA to OM1, SVG to D1, SVG to OM2) 06/16/2004 by Dr. Roxy Manns.He had a history of PCI DESto proxLAD/1stdiagonal in 2008. He had a patent LIMA to LAD, patent RIMA to OM1,occludedSVG to OM 2, and occluded SVG to D1. EF 50% at the time.He did develop ventricular fibrillation during the cardiac catheterization requiringcardioversion. The patient was seen by Truitt Merle on 07/09/2017 at which time he complained of intermittent shortness of breath for  1 month which occured with exertion and resolved quickly with rest.He underwent Myoview on 07/15/2017 which showed EF 33% with a medium defect of severe severity present in the apical anterior, apical lateral and apex location consistent with prior infarction, no ischemia present. The report was reviewed by Dr. Tamala Julian. Given the new LV dysfunction, there  was concern of previous heart attack, therefore it was recommended for the patient to undergo cardiac catheterization.  This was discussed with the patient by Dr. Tamala Julian and at an [place] with Almyra Deforest PA 07/18/17.  He was very hesitant to proceed with cardiac catheterization especially with his history of ventricular fibrillation during the last cardiac catheterization. He wished to do a trial of medical therapy first. Mr Eulas Post adjusted hs medications, decreased his hydrochlorothiazide to 12.5 mg daily, added Toprol-XL 25 mg daily and 81 mg aspirin.  The pt walked into the office the night of admission at 5 pm complaining of chest pain-6/10. He was seen again by Mr Eulas Post and his EKG was reviewed with the DOD in the office, no STEMI. It was decided to send the patient to the ED for further evaluation.   Given his symptoms he was admitted with plans to undergo cath.   Hospital Course     Underwent cath noted above with similar cath findings to 2008, but did note a decline in EF to 35-45%. Plan will be for medical therapy. His metoprolol was was increased from 25mg  to 50mg  daily. Also added Entresto, and low dose nitrates. Morning labs were stable following the cath. No further chest pain. Also added lasix 40mg  daily given his elevated LVEDP. He reported much improvement with medication adjustments.   General: Well developed, well nourished, male appearing in no acute distress. Head: Normocephalic, atraumatic.  Neck: Supple without bruits, JVD. Lungs:  Resp regular and unlabored, CTA. Heart: RRR, S1, S2, no S3, S4, or murmur; no rub. Abdomen: Soft, non-tender, non-distended with normoactive bowel sounds. Extremities: No clubbing, cyanosis, edema. Distal pedal pulses are 2+ bilaterally. R femoral cath site stable without bruising or hematoma Neuro: Alert and oriented X 3. Moves all extremities spontaneously. Psych: Normal affect.  SAQUAN FURTICK was seen by Dr. Tamala Julian and determined stable for  discharge home. Follow up in the office has been arranged. Medications are listed below.   _____________  Discharge Vitals Blood pressure (!) 126/112, pulse 83, temperature 97.9 F (36.6 C), temperature source Oral, resp. rate 19, height 5\' 7"  (1.702 m), weight 197 lb 8 oz (89.6 kg), SpO2 96 %.  Filed Weights   07/24/17 2139 07/25/17 0548 07/26/17 0358  Weight: 199 lb 8 oz (90.5 kg) 199 lb 11.2 oz (90.6 kg) 197 lb 8 oz (89.6 kg)    Labs & Radiologic Studies    CBC Recent Labs    07/25/17 0850 07/26/17 0459  WBC 8.4 9.0  HGB 14.9 15.8  HCT 43.9 46.0  MCV 93.6 93.3  PLT 259 664   Basic Metabolic Panel Recent Labs    07/24/17 1856 07/26/17 0459  NA 138 134*  K 3.8 3.6  CL 105 101  CO2 23 23  GLUCOSE 96 103*  BUN 15 18  CREATININE 1.04 0.98  CALCIUM 9.1 9.1   Liver Function Tests No results for input(s): AST, ALT, ALKPHOS, BILITOT, PROT, ALBUMIN in the last 72 hours. No results for input(s): LIPASE, AMYLASE in the last 72 hours. Cardiac Enzymes Recent Labs    07/24/17 2023 07/25/17 0224 07/25/17 0850  TROPONINI 0.10* 0.08* 0.09*  BNP Invalid input(s): POCBNP D-Dimer No results for input(s): DDIMER in the last 72 hours. Hemoglobin A1C No results for input(s): HGBA1C in the last 72 hours. Fasting Lipid Panel Recent Labs    07/24/17 1924  CHOL 98  HDL 37*  LDLCALC 40  TRIG 107  CHOLHDL 2.6   Thyroid Function Tests No results for input(s): TSH, T4TOTAL, T3FREE, THYROIDAB in the last 72 hours.  Invalid input(s): FREET3 _____________  Dg Chest 2 View  Result Date: 07/24/2017 CLINICAL DATA:  Chest pain EXAM: CHEST - 2 VIEW COMPARISON:  02/06/2008 chest radiograph. FINDINGS: Stable discontinuity in the upper most sternotomy wire. CABG clips overlie the mediastinum. Stable cardiomediastinal silhouette with normal heart size. No pneumothorax. No pleural effusion. Stable tiny granuloma in the upper right lung. No pulmonary edema. No acute consolidative  airspace disease. IMPRESSION: No active cardiopulmonary disease. Electronically Signed   By: Ilona Sorrel M.D.   On: 07/24/2017 19:45   Disposition   Pt is being discharged home today in good condition.  Follow-up Plans & Appointments    Follow-up Information    Charlie Pitter, PA-C Follow up on 08/08/2017.   Specialties:  Cardiology, Radiology Why:  at 10:30am for your follow up appt.  Contact information: [address] [place] [address] [phone]          Discharge Instructions    Call MD for:  redness, tenderness, or signs of infection (pain, swelling, redness, odor or green/yellow discharge around incision site)   Complete by:  As directed    Diet - low sodium heart healthy   Complete by:  As directed    Discharge instructions   Complete by:  As directed    Groin Site Care Refer to this sheet in the next few weeks. These instructions provide you with information on caring for yourself after your procedure. Your caregiver may also give you more specific instructions. Your treatment has been planned according to current medical practices, but problems sometimes occur. Call your caregiver if you have any problems or questions after your procedure. HOME CARE INSTRUCTIONS You may shower 24 hours after the procedure. Remove the bandage (dressing) and gently wash the site with plain soap and water. Gently pat the site dry.  Do not apply powder or lotion to the site.  Do not sit in a bathtub, swimming pool, or whirlpool for 5 to 7 days.  No bending, squatting, or lifting anything over 10 pounds (4.5 kg) as directed by your caregiver.  Inspect the site at least twice daily.  Do not drive home if you are discharged the same day of the procedure. Have someone else drive you.  You may drive 24 hours after the procedure unless otherwise instructed by your caregiver.  What to expect: Any bruising will usually fade within 1 to 2 weeks.  Blood that collects in  the tissue (hematoma) may be painful to the touch. It should usually decrease in size and tenderness within 1 to 2 weeks.  SEEK IMMEDIATE MEDICAL CARE IF: You have unusual pain at the groin site or down the affected leg.  You have redness, warmth, swelling, or pain at the groin site.  You have drainage (other than a small amount of blood on the dressing).  You have chills.  You have a fever or persistent symptoms for more than 72 hours.  You have a fever and your symptoms suddenly get worse.  Your leg becomes pale, cool, tingly, or numb.  You have  heavy bleeding from the site. Hold pressure on the site. .   Increase activity slowly   Complete by:  As directed       Discharge Medications     Medication List    STOP taking these medications   hydrochlorothiazide 12.5 MG tablet Commonly known as:  HYDRODIURIL   losartan 100 MG tablet Commonly known as:  COZAAR   RED YEAST RICE PO     TAKE these medications   aspirin EC 81 MG tablet Take 1 tablet (81 mg total) by mouth daily.   B-12 PO Take 1 tablet by mouth daily.   B-6 PO Take 1 tablet by mouth daily.   Co Q-10 300 MG Caps Take 1 capsule by mouth daily.   furosemide 40 MG tablet Commonly known as:  LASIX Take 1 tablet (40 mg total) by mouth daily.   GARLIC PO Take 1 tablet by mouth daily.   isosorbide mononitrate 30 MG 24 hr tablet Commonly known as:  IMDUR Take 1 tablet (30 mg total) by mouth daily.   MAGNESIUM PO Take 1 tablet by mouth daily.   metoprolol succinate 50 MG 24 hr tablet Commonly known as:  TOPROL-XL Take 1 tablet (50 mg total) by mouth daily. What changed:    medication strength  how much to take   MULTIVITAMIN PO Take 1 tablet by mouth daily.   nitroGLYCERIN 0.4 MG SL tablet Commonly known as:  NITROSTAT Place 1 tablet (0.4 mg total) under the tongue every 5 (five) minutes x 3 doses as needed for chest pain.   rosuvastatin 40 MG tablet Commonly known as:  CRESTOR Take 1 tablet  (40 mg total) by mouth daily.   sacubitril-valsartan 24-26 MG Commonly known as:  ENTRESTO Take 1 tablet by mouth 2 (two) times daily.   TURMERIC PO Take 1 tablet by mouth daily.   vitamin C 500 MG tablet Commonly known as:  ASCORBIC ACID Take 500 mg by mouth daily.   VITAMIN E PO Take 1 tablet by mouth daily.        Outstanding Labs/Studies   BMET at follow up appt.   Duration of Discharge Encounter   Greater than 30 minutes including physician time.  Signed, Reino Bellis NP-C 07/26/2017, 10:01 AM

## 2017-07-26 NOTE — Plan of Care (Signed)
Pt. Is independent

## 2017-07-29 NOTE — Telephone Encounter (Signed)
LMTCB

## 2017-07-30 NOTE — Telephone Encounter (Signed)
New message ° °Pt verbalized that he is returning call for RN °

## 2017-07-30 NOTE — Telephone Encounter (Signed)
Patient contacted regarding discharge from [facility] on 07/26/2017.  Patient understands to follow up with provider Melina Copa, PA-c on 08/08/2017 at 10:30 at [facility] in [place]. Patient understands discharge instructions? Yes Patient understands medications and regiment? Yes Patient understands to bring all medications to this visit? Yes

## 2017-07-30 NOTE — Telephone Encounter (Signed)
Left message to call back  

## 2017-07-31 ENCOUNTER — Ambulatory Visit: Payer: BLUE CROSS/BLUE SHIELD | Admitting: Cardiology

## 2017-08-06 ENCOUNTER — Encounter: Payer: Self-pay | Admitting: Interventional Cardiology

## 2017-08-07 ENCOUNTER — Encounter: Payer: Self-pay | Admitting: Physician Assistant

## 2017-08-07 NOTE — Progress Notes (Signed)
Cardiology Office Note    Date:  08/08/2017  ID:  Scott Roth, DOB 1955/09/09, MRN [id number] PCP:  System, Pcp Not In  Cardiologist: Dr. Tamala Julian   Chief Complaint: f/u cath  History of Present Illness:  Scott Roth is a 62 y.o. male with history of CAD (CABG x4 with LIMA to dLAD, free RIMA to OM1, SVG to D1, SVG to OM2 in 2006, PCI 2008 with notable ventricular fibrillation during cath), HTN, HLD, OSA not on CPAP, ED, arthritis who presents for post-hospital follow-up.  To further elaborate on cardiac history, EF was normal in 2004 through 2008 (50% in 2008). He underwent DES to proxLAD/1stdiagonal in 2008 and had Vfib during the cath requiring cardioversion. He was recently followed as an OP for chest pain with abnormal nuclear stress test and drop in EF to 33%. Cath was recommended but the patient was initially reluctant given h/o VF during cath. Medications were adjusted. He had recurrent symptoms with mildly elevated troponin and was admitted for cardiac cath. This was done 07/25/17 demonstrated previously placed stents, patent LIMA-LAD, patent free RIMA-OM, occluded VG-diagonal and occluded Y graft-OM2, without significant change from prior, elevated LVEDP - except for LV dysfunction with EF 35%. Labs 07/26/17 showed Na 134, K 3.6, Cr 0.98, normal CBC, LDL 40. Toprol was titrated, and Entresto/nitrates were added along with Lasix.  He returns for follow-up feeling great. No further CP. No dyspnea, edema, orthopnea, syncope. Has not had any difficulty tolerating/obtaining meds. Does admit to daytime fatigue but was unable to tolerate large CPAP mask about 3 years ago. He reports getting back into exercise without adverse effect. Lowest BP at home was 149 systolic / 73 systolic.   Past Medical History:  Diagnosis Date  . Arthritis   . CAD (coronary artery disease)    a. CABG x4 with LIMA to dLAD, free RIMA to OM1, SVG to D1, SVG to OM2 in 2006. b. PCI 2008 with notable  ventricular fibrillation during cath) - DES to proxLAD/1stdiagonal. c. Cath 07/25/17 demonstrated previously placed stents, patent LIMA-LAD, patent free RIMA-OM, occluded VG-diagonal and occluded Y graft-OM2, without significant change from prior, elevated LVEDP.  . Cardiomyopathy ([place])    a. EF prev normal 2004, 2008. b. EF 35% by cath in 07/2017.  Marland Kitchen Chronic systolic CHF (congestive heart failure) ([place])   . Erectile dysfunction   . GERD (gastroesophageal reflux disease)   . Hyperlipidemia   . Hypertension   . Sleep apnea    does not wear mask  . Tubular adenoma     Past Surgical History:  Procedure Laterality Date  . 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 CORONARY ARTERY BYPASS GRAFT  2008  . EYE SURGERY Bilateral    Lasik  . KNEE ARTHROPLASTY Left   . LEFT HEART CATH AND CORS/GRAFTS ANGIOGRAPHY N/A 07/25/2017   Procedure: LEFT HEART CATH AND CORS/GRAFTS ANGIOGRAPHY;  Surgeon: Lorretta Harp, MD;  Location: [facility];  Service: Cardiovascular;  Laterality: N/A;  . SHOULDER SURGERY Right   . TOTAL KNEE ARTHROPLASTY Right 08/25/2014  . TOTAL KNEE ARTHROPLASTY Right 08/25/2014   Procedure: RIGHT TOTAL KNEE ARTHROPLASTY;  Surgeon: Kathryne Hitch, MD;  Location: [facility];  Service: Orthopedics;  Laterality: Right;    Current Medications: Current Meds  Medication Sig  . aspirin EC 81 MG tablet Take 1 tablet (81 mg total) by mouth daily.  . Coenzyme Q10 (CO  Q-10) 300 MG CAPS Take 1 capsule by mouth daily.  . Cyanocobalamin (B-12 PO) Take 1 tablet by mouth daily.   . furosemide (LASIX) 40 MG tablet Take 1 tablet (40 mg total) by mouth daily.  Marland Kitchen GARLIC PO Take 1 tablet by mouth daily.  . isosorbide mononitrate (IMDUR) 30 MG 24 hr tablet Take 1 tablet (30 mg total) by mouth daily.  Marland Kitchen MAGNESIUM PO Take 1 tablet by mouth daily.  . metoprolol succinate (TOPROL-XL) 50 MG 24 hr tablet Take 1 tablet (50 mg total) by mouth daily.  .  Multiple Vitamins-Minerals (MULTIVITAMIN PO) Take 1 tablet by mouth daily.   . nitroGLYCERIN (NITROSTAT) 0.4 MG SL tablet Place 1 tablet (0.4 mg total) under the tongue every 5 (five) minutes x 3 doses as needed for chest pain.  . pantoprazole (PROTONIX) 40 MG tablet Take 40 mg by mouth daily.  . Pyridoxine HCl (B-6 PO) Take 1 tablet by mouth daily.   . rosuvastatin (CRESTOR) 40 MG tablet Take 1 tablet (40 mg total) by mouth daily.  . sacubitril-valsartan (ENTRESTO) 24-26 MG Take 1 tablet by mouth 2 (two) times daily.  . TURMERIC PO Take 1 tablet by mouth daily.  . vitamin C (ASCORBIC ACID) 500 MG tablet Take 500 mg by mouth daily.  Marland Kitchen VITAMIN E PO Take 1 tablet by mouth daily.      Allergies:   Penicillins   Social History   Socioeconomic History  . Marital status: Single    Spouse name: Not on file  . Number of children: Not on file  . Years of education: Not on file  . Highest education level: Not on file  Occupational History  . Not on file  Social Needs  . Financial resource strain: Not on file  . Food insecurity:    Worry: Not on file    Inability: Not on file  . Transportation needs:    Medical: Not on file    Non-medical: Not on file  Tobacco Use  . Smoking status: Never Smoker  . Smokeless tobacco: Never Used  Substance and Sexual Activity  . Alcohol use: No  . Drug use: No  . Sexual activity: Not on file  Lifestyle  . Physical activity:    Days per week: Not on file    Minutes per session: Not on file  . Stress: Not on file  Relationships  . Social connections:    Talks on phone: Not on file    Gets together: Not on file    Attends religious service: Not on file    Active member of club or organization: Not on file    Attends meetings of clubs or organizations: Not on file    Relationship status: Not on file  Other Topics Concern  . Not on file  Social History Narrative  . Not on file     Family History:  Family History  Problem Relation Age of  Onset  . Heart failure Father   . Hypertension Mother    ROS:   Please see the history of present illness. + intermittent back pain, also some difficulty with belching/stomach growling ever since GI illness in [place]. All other systems are reviewed and otherwise negative.    PHYSICAL EXAM:   VS:  BP 132/78   Pulse 64   Ht 5\' 7"  (1.702 m)   Wt 202 lb (91.6 kg)   BMI 31.64 kg/m   BMI: Body mass index is 31.64 kg/m. GEN: Well nourished, well  developed M, in no acute distress  HEENT: normocephalic, atraumatic Neck: no JVD, carotid bruits, or masses Cardiac: RRR; no murmurs, rubs, or gallops, no edema  Respiratory:  clear to auscultation bilaterally, normal work of breathing GI: soft, nontender, nondistended, + BS MS: no deformity or atrophy  Skin: warm and dry, no rash. Right groin cath site without hematoma, ecchymosis, or bruit. Neuro:  Alert and Oriented x 3, Strength and sensation are intact, follows commands Psych: euthymic mood, full affect  Wt Readings from Last 3 Encounters:  08/08/17 202 lb (91.6 kg)  07/26/17 197 lb 8 oz (89.6 kg)  07/18/17 202 lb 12.8 oz (92 kg)      Studies/Labs Reviewed:   EKG:   EKG was not ordered today.  Recent Labs: 07/15/2017: ALT 28 07/26/2017: BUN 18; Creatinine, Ser 0.98; Hemoglobin 15.8; Platelets 278; Potassium 3.6; Sodium 134   Lipid Panel    Component Value Date/Time   CHOL 98 07/24/2017 1924   CHOL 157 07/15/2017 0740   TRIG 107 07/24/2017 1924   HDL 37 (L) 07/24/2017 1924   HDL 46 07/15/2017 0740   CHOLHDL 2.6 07/24/2017 1924   VLDL 21 07/24/2017 1924   LDLCALC 40 07/24/2017 1924   [place] 85 07/15/2017 0740    Additional studies/ records that were reviewed today include: Summarized above.    ASSESSMENT & PLAN:   1. Chronic systolic CHF - appears euvolemic. Reviewed 2g sodium restriction, 2L fluid restriction, daily weights with patient. Will continue to titrate medication per guideline-directed therapy. Check  BMET today. If acceptable will plan to titrate Entresto to next step up and recheck visit in 3-4 weeks. Advised pt to call if any low BP readings. May also consider decreasing Lasix to 20mg  daily with addition of spironolactone in the near future. Once max medical therapy is achieved would recommend repeating echocardiogram to assess candidacy for ICD. 2. CAD - stable by recent cath. Continue current regimen. 3. HTN - follow BP with medication changes. Discussed monitoring for low BP. 4. Hyperlipidemia - controlled by recent lipid check.  Disposition: F/u with me in 3-4 weeks for further med titration. Also advised pt f/u with PCP to discuss further options for sleep apnea treatment alternatives (primary care was the team that helped facilitate testing 3 years ago, unclear to which service though). He also reports some stomach growling/belching ever since a GI bug several months ago. Offered to refer to GI if symptoms continue, await patient input.   Medication Adjustments/Labs and Tests Ordered: Current medicines are reviewed at length with the patient today.  Concerns regarding medicines are outlined above. Medication changes, Labs and Tests ordered today are summarized above and listed in the Patient Instructions accessible in Encounters.   Signed, Charlie Pitter, PA-C  08/08/2017 10:49 AM    [facility] [place], [place], [place]  [address] Phone: [phone]; Fax: [phone]

## 2017-08-08 ENCOUNTER — Encounter: Payer: Self-pay | Admitting: Physician Assistant

## 2017-08-08 ENCOUNTER — Ambulatory Visit: Payer: BLUE CROSS/BLUE SHIELD | Admitting: Physician Assistant

## 2017-08-08 VITALS — BP 132/78 | HR 64 | Ht 67.0 in | Wt 202.0 lb

## 2017-08-08 DIAGNOSIS — I2510 Atherosclerotic heart disease of native coronary artery without angina pectoris: Secondary | ICD-10-CM | POA: Diagnosis not present

## 2017-08-08 DIAGNOSIS — I5022 Chronic systolic (congestive) heart failure: Secondary | ICD-10-CM | POA: Diagnosis not present

## 2017-08-08 DIAGNOSIS — E785 Hyperlipidemia, unspecified: Secondary | ICD-10-CM

## 2017-08-08 DIAGNOSIS — I10 Essential (primary) hypertension: Secondary | ICD-10-CM

## 2017-08-08 NOTE — Patient Instructions (Addendum)
Medication Instructions:  Your physician recommends that you continue on your current medications as directed. Please refer to the Current Medication list given to you today.   Labwork: TODAY:  BMET  Testing/Procedures: None ordered  Follow-Up: Your physician recommends that you schedule a follow-up appointment in: [place]   Any Other Special Instructions Will Be Listed Below (If Applicable).     If you need a refill on your cardiac medications before your next appointment, please call your pharmacy.

## 2017-08-09 ENCOUNTER — Telehealth: Payer: Self-pay | Admitting: Physician Assistant

## 2017-08-09 LAB — BASIC METABOLIC PANEL
BUN / CREAT RATIO: 17 (ref 10–24)
BUN: 15 mg/dL (ref 8–27)
CO2: 27 mmol/L (ref 20–29)
CREATININE: 0.86 mg/dL (ref 0.76–1.27)
Calcium: 9.6 mg/dL (ref 8.6–10.2)
Chloride: 100 mmol/L (ref 96–106)
GFR calc Af Amer: 107 mL/min/{1.73_m2} (ref >59)
GFR, EST NON AFRICAN AMERICAN: 93 mL/min/{1.73_m2} (ref >59)
GLUCOSE: 121 mg/dL — AB (ref 65–99)
Potassium: 4.4 mmol/L (ref 3.5–5.2)
Sodium: 137 mmol/L (ref 134–144)

## 2017-08-09 MED ORDER — ISOSORBIDE MONONITRATE ER 60 MG PO TB24: 60.0000 mg | ORAL_TABLET | Freq: Every day | ORAL | 3 refills | Status: DC

## 2017-08-09 MED ORDER — SACUBITRIL-VALSARTAN 49-51 MG PO TABS: 1.0000 | ORAL_TABLET | Freq: Two times a day (BID) | ORAL | 0 refills | Status: DC

## 2017-08-09 NOTE — Telephone Encounter (Signed)
-----   Message from Scott Roth, [place] sent at 08/09/2017 12:37 PM EDT ----- Please let patient know labs normal except blood sugar slightly elevated which it has been in the past - if not already followed by primary care, should make sure PCP is aware. Let's go up on Entresto to 49/51mg  BID and follow-up as planned - would not send in a 90 day rx because at next OV if things are looking good, we will plan to titrate to the target dose of Entresto. Let him know there is another medicine we might consider in the future called spironolactone (if he happens to read up on heart failure and hear about this medicine), but I want to take it one step at a time so we follow his potassium and kidney function carefully. Dayna Dunn PA-C

## 2017-08-09 NOTE — Telephone Encounter (Signed)
Result Notes for Basic metabolic panel   Notes recorded by Jeanann Lewandowsky, RMA on 08/09/2017 at 3:15 PM EDT Spoke with pt re: lab results and pt started to let me know that he has had some chest pain since his hospitalization. Pt was asked if he discussed this with Melina Copa, PA-C at his o/v yesterday, and pt stated "I forgot". He did let me know that he had a spell of chest pain earlier today, while walking up the stairs, that lasted 15-20 minutes, but he didn't take a Nitro. I contacted Melina Copa, PA-C, which in turn, called pt to let him know, per Dayna, the only way we can be sure he isn't having any post cath complications, is go to the ED for EKG and lab work. Pt declined. Pt was advised to let us know if he continued to have these pains. We did increase hs Entresto to 49/51 bid and his Imdur to 60 mg daily. Pt verbalized understanding.  Will cc Dr. Tamala Julian, per Melina Copa.

## 2017-08-09 NOTE — Telephone Encounter (Signed)
New Message ° ° °Pt returning call for nurse °

## 2017-08-12 ENCOUNTER — Telehealth: Payer: Self-pay | Admitting: Physician Assistant

## 2017-08-12 NOTE — Telephone Encounter (Signed)
Returned [employer] call, spoke with Tiffany P. She has been made aware that we do want the pt on Entresto. She stated she would send out the PA.

## 2017-08-12 NOTE — Telephone Encounter (Signed)
New Message   [place] with [employer] is calling in reference to the [place]. He wants to be sure that the provider wants him to take the [facility] before they start the prior authorizatuion process. Please call to discuss and use reference key [id number].

## 2017-08-13 ENCOUNTER — Telehealth: Payer: Self-pay

## 2017-08-13 NOTE — Telephone Encounter (Signed)
I have done an [place] PA through covermymeds. Key: [id number]

## 2017-08-15 ENCOUNTER — Telehealth: Payer: Self-pay | Admitting: *Deleted

## 2017-08-15 ENCOUNTER — Encounter: Payer: Self-pay | Admitting: Gastroenterology

## 2017-08-15 ENCOUNTER — Ambulatory Visit: Payer: BLUE CROSS/BLUE SHIELD | Admitting: Interventional Cardiology

## 2017-08-15 DIAGNOSIS — R142 Eructation: Secondary | ICD-10-CM

## 2017-08-15 NOTE — Telephone Encounter (Signed)
Called pt to see how he's feeling and pt advised me that he is feeling much better. Pt is interested in a referral to GI, so will do that with this encounter.  Pt did mention that you and him had discussed a possible bacteria he had caught a bacteria from one of the other countries he was in, and I advised pt to call his PCP.  Pt also advised that he is scheduled to go to [place] from 08/20/17 and returning 09/10/17.  His f/u appt is 08/28/17 with Melina Copa, PA-C.  Pt wants to know if it is ok for him to go on this trip?  I advised I would find out and let him know.  Pt thanked me for the call.

## 2017-08-15 NOTE — Telephone Encounter (Signed)
The bacteria his companion brought up is a GI type bacteria, so the GI referral is appropriate. I think it is OK for him to travel as long as he is feeling well and blood pressure at home is stable. He should make sure he has all meds with him and not miss doses. Reda Gettis PA-C

## 2017-08-15 NOTE — Telephone Encounter (Signed)
Spoke with pt and his appt has been rescheduled to 09/11/17 with Melina Copa, PA-C.  Pt very appreciative of the help today.

## 2017-08-15 NOTE — Telephone Encounter (Signed)
-----   Message from Charlie Pitter, [place] sent at 08/10/2017 11:50 AM EDT ----- Regarding: GI eval  If you could call Mr Disney early this week to see how he's feeling - one other thing we could potentially do is to refer to him to GI as well because he's also had recent stomach/belching issues. We briefly talked about it during his office visit so find out if he wants [place] to formally send in a referral. Dayna Dunn PA-C

## 2017-08-20 ENCOUNTER — Other Ambulatory Visit: Payer: Self-pay | Admitting: Cardiology

## 2017-08-28 ENCOUNTER — Ambulatory Visit: Payer: BLUE CROSS/BLUE SHIELD | Admitting: Physician Assistant

## 2017-08-30 ENCOUNTER — Ambulatory Visit: Payer: BLUE CROSS/BLUE SHIELD | Admitting: Interventional Cardiology

## 2017-09-10 NOTE — Progress Notes (Signed)
Cardiology Office Note    Date:  09/11/2017  ID:  Scott Roth, DOB 12/25/1955, MRN [id number] PCP:  Manfred Shirts, PA  Cardiologist:  Sinclair Grooms, MD   Chief Complaint: f/u CHF  History of Present Illness:  Scott Roth is a 62 y.o. male with history of CAD (CABG x4 with LIMA to dLAD, free RIMA to OM1, SVG to D1, SVG to OM2 in 2006, PCI 2008 with notable ventricular fibrillation during cath), HTN, HLD, OSA not on CPAP, ED, arthritis who presents for CHF med titration.  To further elaborate on cardiac history, EF was normal in 2004 through 2008 (50% in 2008). He underwent DES to proxLAD/1stdiagonal in 2008 and had Vfib during the cath requiring cardioversion. He was recently followed as an OP for chest pain with abnormal nuclear stress test and drop in EF to 33%. Cath was recommended but the patient was initially reluctant given h/o VF during cath. Medications were adjusted. He had recurrent symptoms with chest pain and NYHA class II-III dyspnea with mildly elevated troponin and was admitted for cardiac cath. This was done 07/25/17 demonstrated previously placed stents, patent LIMA-LAD, patent free RIMA-OM, occluded VG-diagonal and occluded Y graft-OM2, without significant change from prior, elevated LVEDP - except for LV dysfunction with EF 35%. Labs 07/26/17 showed Na 134, K 3.6, Cr 0.98, normal CBC, LDL 40. Toprol was titrated, and Entresto/nitrates were added along with Lasix. At last visit 08/08/17 we titrated Entresto to next step up.  He presents back today for follow-up feeling better than last visit. He did notice while in [place] he would get lightheaded sometimes if he had skipped meals. No syncope. He has rare left sided chest soreness, tender to palpation, which does not occur when he works out. He is generally able to exercise, swim and do strength training without dyspnea except with higher levels of activity. He does state he actually never titrated Toprol to 50mg   daily as the higher dose made him feel dizzy/fatigued. He seems to be doing well on current regimen.   Past Medical History:  Diagnosis Date  . Arthritis   . CAD (coronary artery disease)    a. CABG x4 with LIMA to dLAD, free RIMA to OM1, SVG to D1, SVG to OM2 in 2006. b. PCI 2008 with notable ventricular fibrillation during cath) - DES to proxLAD/1stdiagonal. c. Cath 07/25/17 demonstrated previously placed stents, patent LIMA-LAD, patent free RIMA-OM, occluded VG-diagonal and occluded Y graft-OM2, without significant change from prior, elevated LVEDP.  . Cardiomyopathy ([place])    a. EF prev normal 2004, 2008. b. EF 35% by cath in 07/2017.  Marland Kitchen Chronic systolic CHF (congestive heart failure) ([place])   . Erectile dysfunction   . GERD (gastroesophageal reflux disease)   . Hyperlipidemia   . Hypertension   . Sleep apnea    does not wear mask  . Tubular adenoma     Past Surgical History:  Procedure Laterality Date  . 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 CORONARY ARTERY BYPASS GRAFT  2008  . EYE SURGERY Bilateral    Lasik  . KNEE ARTHROPLASTY Left   . LEFT HEART CATH AND CORS/GRAFTS ANGIOGRAPHY N/A 07/25/2017   Procedure: LEFT HEART CATH AND CORS/GRAFTS ANGIOGRAPHY;  Surgeon: Lorretta Harp, MD;  Location: [facility];  Service: Cardiovascular;  Laterality: N/A;  . SHOULDER SURGERY Right   . TOTAL KNEE ARTHROPLASTY Right  08/25/2014  . TOTAL KNEE ARTHROPLASTY Right 08/25/2014   Procedure: RIGHT TOTAL KNEE ARTHROPLASTY;  Surgeon: Kathryne Hitch, MD;  Location: [facility];  Service: Orthopedics;  Laterality: Right;    Current Medications: Current Meds  Medication Sig  . aspirin EC 81 MG tablet Take 1 tablet (81 mg total) by mouth daily.  . Coenzyme Q10 (CO Q-10) 300 MG CAPS Take 1 capsule by mouth daily.  . Cyanocobalamin (B-12 PO) Take 1 tablet by mouth daily.   . furosemide (LASIX) 40 MG tablet TAKE 1 TABLET BY MOUTH EVERY DAY  .  GARLIC PO Take 1 tablet by mouth daily.  . isosorbide mononitrate (IMDUR) 60 MG 24 hr tablet Take 1 tablet (60 mg total) by mouth daily.  Marland Kitchen MAGNESIUM PO Take 1 tablet by mouth daily.  . metoprolol succinate (TOPROL-XL) 50 MG 24 hr tablet Take 1 tablet (50 mg total) by mouth daily.  . Multiple Vitamins-Minerals (MULTIVITAMIN PO) Take 1 tablet by mouth daily.   . nitroGLYCERIN (NITROSTAT) 0.4 MG SL tablet PLACE 1 TABLET UNDER THE TONGUE EVERY 5 (FIVE) MINUTES X 3 DOSES AS NEEDED FOR CHEST PAIN.  Marland Kitchen pantoprazole (PROTONIX) 40 MG tablet Take 40 mg by mouth daily.  . Pyridoxine HCl (B-6 PO) Take 1 tablet by mouth daily.   . rosuvastatin (CRESTOR) 40 MG tablet Take 1 tablet (40 mg total) by mouth daily.  . sacubitril-valsartan (ENTRESTO) 49-51 MG Take 1 tablet by mouth 2 (two) times daily.  . TURMERIC PO Take 1 tablet by mouth daily.  . vitamin C (ASCORBIC ACID) 500 MG tablet Take 500 mg by mouth daily.  Marland Kitchen VITAMIN E PO Take 1 tablet by mouth daily.      Allergies:   Penicillins   Social History   Socioeconomic History  . Marital status: Single    Spouse name: Not on file  . Number of children: Not on file  . Years of education: Not on file  . Highest education level: Not on file  Occupational History  . Not on file  Social Needs  . Financial resource strain: Not on file  . Food insecurity:    Worry: Not on file    Inability: Not on file  . Transportation needs:    Medical: Not on file    Non-medical: Not on file  Tobacco Use  . Smoking status: Never Smoker  . Smokeless tobacco: Never Used  Substance and Sexual Activity  . Alcohol use: No  . Drug use: No  . Sexual activity: Not on file  Lifestyle  . Physical activity:    Days per week: Not on file    Minutes per session: Not on file  . Stress: Not on file  Relationships  . Social connections:    Talks on phone: Not on file    Gets together: Not on file    Attends religious service: Not on file    Active member of club or  organization: Not on file    Attends meetings of clubs or organizations: Not on file    Relationship status: Not on file  Other Topics Concern  . Not on file  Social History Narrative  . Not on file     Family History:  The patient's family history includes Heart failure in his father; Hypertension in his mother.  ROS:   Please see the history of present illness.  All other systems are reviewed and otherwise negative.    PHYSICAL EXAM:   VS:  BP 132/84  Pulse 64   Ht 5\' 7"  (1.702 m)   Wt 201 lb (91.2 kg)   SpO2 97%   BMI 31.48 kg/m   BMI: Body mass index is 31.48 kg/m. GEN: Well nourished, well developed fit appearing M, in no acute distress HEENT: normocephalic, atraumatic Neck: no JVD, carotid bruits, or masses Cardiac: RRR; no murmurs, rubs, or gallops, no edema  Respiratory:  clear to auscultation bilaterally, normal work of breathing GI: soft, nontender, nondistended, + BS MS: no deformity or atrophy Skin: warm and dry, no rash Neuro:  Alert and Oriented x 3, Strength and sensation are intact, follows commands Psych: euthymic mood, full affect  Wt Readings from Last 3 Encounters:  09/11/17 201 lb (91.2 kg)  08/08/17 202 lb (91.6 kg)  07/26/17 197 lb 8 oz (89.6 kg)      Studies/Labs Reviewed:   EKG: EKG was not ordered today.  Recent Labs: 07/15/2017: ALT 28 07/26/2017: Hemoglobin 15.8; Platelets 278 08/08/2017: BUN 15; Creatinine, Ser 0.86; Potassium 4.4; Sodium 137   Lipid Panel    Component Value Date/Time   CHOL 98 07/24/2017 1924   CHOL 157 07/15/2017 0740   TRIG 107 07/24/2017 1924   HDL 37 (L) 07/24/2017 1924   HDL 46 07/15/2017 0740   CHOLHDL 2.6 07/24/2017 1924   VLDL 21 07/24/2017 1924   LDLCALC 40 07/24/2017 1924   [place] 85 07/15/2017 0740    Additional studies/ records that were reviewed today include: Summarized above.    ASSESSMENT & PLAN:   1. Chronic systolic CHF - appears to be doing well. Does have intermittent sx of  lightheadedness but primarily related to skipping meals. We discussed keeping on track with small frequent meals to help eliminate that as a factor so we can help track any medication side effects. I am OK with keeping Toprol at 25mg  daily given his relatively low resting HR - did not tolerate 50mg  due to dizziness and fatigue. Will check labs today to help guide titration of Entresto and consideration of downtitration of Lasix with possible addition of spironolactone based on BMET/BNP. Weight stable. We have already reviewed in depth the 2g sodium/2L fluid restriction last visit. 2. CAD - recent cath was stable. No anginal-type chest pain. Continue ASA, BB, statin, Imdur. 3. Essential HTN - BP upper limits of normal. Follow with anticipated med titration. 4. Hyperlipidemia - controlled by recent lipid check.  Disposition: F/u with either myself, Dr. Tamala Julian or Dr. Thompson Caul care team in approximately 2-3 weeks, prior to his trip to [place].  Medication Adjustments/Labs and Tests Ordered: Current medicines are reviewed at length with the patient today.  Concerns regarding medicines are outlined above. Medication changes, Labs and Tests ordered today are summarized above and listed in the Patient Instructions accessible in Encounters.   Signed, Charlie Pitter, PA-C  09/11/2017 11:11 AM    [address], [place], [place]  [address] Phone: [phone]; Fax: [phone]

## 2017-09-11 ENCOUNTER — Encounter (INDEPENDENT_AMBULATORY_CARE_PROVIDER_SITE_OTHER): Payer: Self-pay

## 2017-09-11 ENCOUNTER — Encounter: Payer: Self-pay | Admitting: Physician Assistant

## 2017-09-11 ENCOUNTER — Ambulatory Visit (INDEPENDENT_AMBULATORY_CARE_PROVIDER_SITE_OTHER): Payer: BLUE CROSS/BLUE SHIELD | Admitting: Physician Assistant

## 2017-09-11 VITALS — BP 132/84 | HR 64 | Ht 67.0 in | Wt 201.0 lb

## 2017-09-11 DIAGNOSIS — I10 Essential (primary) hypertension: Secondary | ICD-10-CM

## 2017-09-11 DIAGNOSIS — I2510 Atherosclerotic heart disease of native coronary artery without angina pectoris: Secondary | ICD-10-CM

## 2017-09-11 DIAGNOSIS — I5022 Chronic systolic (congestive) heart failure: Secondary | ICD-10-CM

## 2017-09-11 DIAGNOSIS — E785 Hyperlipidemia, unspecified: Secondary | ICD-10-CM | POA: Diagnosis not present

## 2017-09-11 LAB — BASIC METABOLIC PANEL
BUN / CREAT RATIO: 16 (ref 10–24)
BUN: 16 mg/dL (ref 8–27)
CHLORIDE: 104 mmol/L (ref 96–106)
CO2: 22 mmol/L (ref 20–29)
Calcium: 9.5 mg/dL (ref 8.6–10.2)
Creatinine, Ser: 0.97 mg/dL (ref 0.76–1.27)
GFR calc non Af Amer: 83 mL/min/{1.73_m2} (ref >59)
GFR, EST AFRICAN AMERICAN: 96 mL/min/{1.73_m2} (ref >59)
Glucose: 101 mg/dL — ABNORMAL HIGH (ref 65–99)
POTASSIUM: 4.6 mmol/L (ref 3.5–5.2)
Sodium: 142 mmol/L (ref 134–144)

## 2017-09-11 LAB — PRO B NATRIURETIC PEPTIDE: NT-Pro BNP: 138 pg/mL (ref 0–210)

## 2017-09-11 MED ORDER — METOPROLOL SUCCINATE ER 25 MG PO TB24: 25.0000 mg | ORAL_TABLET | Freq: Every day | ORAL | 3 refills | Status: DC

## 2017-09-11 NOTE — Telephone Encounter (Signed)
**Note De-Identified Scott Roth Obfuscation** Message received through covermymeds:  Message from Plan Effective from 08/13/2017 through 04/15/2038.

## 2017-09-11 NOTE — Patient Instructions (Signed)
Medication Instructions:  Your physician has recommended you make the following change in your medication:   1. TAKE TOPROL 25 MG DAILY- NEW PRESCRIPTION HAS BEEN SENT IN.   Labwork: TODAY: BMET, BNP  Testing/Procedures: NONE ORDERED TODAY  Follow-Up: Your physician recommends that you schedule a follow-up appointment on June 18 [place] AT 11:00 AM WITH MICHELE LENZE, PA-C. PLEASE ARRIVE AT 10:45 AM FOR SCHEDULE APPOINTMENT.   Any Other Special Instructions Will Be Listed Below (If Applicable).     If you need a refill on your cardiac medications before your next appointment, please call your pharmacy.

## 2017-09-11 NOTE — Progress Notes (Signed)
Please let patient know labs look good, stable. I would recommend we go ahead and increase Entresto to 97/103mg  BID.  I contemplated adding spironolactone to regimen as well, but given general potassium uptrend, would prefer to see what potassium/kidney function do on the entresto first. BNP is normal indicating good fluid status. F/u as planned.

## 2017-09-12 ENCOUNTER — Telehealth: Payer: Self-pay | Admitting: *Deleted

## 2017-09-12 MED ORDER — SACUBITRIL-VALSARTAN 97-103 MG PO TABS: 1.0000 | ORAL_TABLET | Freq: Two times a day (BID) | ORAL | 3 refills | Status: DC

## 2017-09-12 NOTE — Telephone Encounter (Signed)
-----   Message from Charlie Pitter, [place] sent at 09/11/2017  4:10 PM EDT ----- Please let patient know labs look good, stable. I would recommend we go ahead and increase Entresto to 97/103mg  BID.  I contemplated adding spironolactone to regimen as well, but given general potassium uptrend, would prefer to see what potassium/kidney function do on the entresto first. BNP is normal indicating good fluid status. F/u as planned.

## 2017-09-18 ENCOUNTER — Encounter (HOSPITAL_BASED_OUTPATIENT_CLINIC_OR_DEPARTMENT_OTHER): Payer: Self-pay | Admitting: Emergency Medicine

## 2017-09-18 ENCOUNTER — Other Ambulatory Visit: Payer: Self-pay

## 2017-09-18 ENCOUNTER — Emergency Department (HOSPITAL_BASED_OUTPATIENT_CLINIC_OR_DEPARTMENT_OTHER)
Admission: EM | Admit: 2017-09-18 | Discharge: 2017-09-18 | Disposition: A | Discharge status: Home / Self Care | Payer: BLUE CROSS/BLUE SHIELD | Attending: Emergency Medicine | Admitting: Emergency Medicine

## 2017-09-18 DIAGNOSIS — I110 Hypertensive heart disease with heart failure: Secondary | ICD-10-CM | POA: Insufficient documentation

## 2017-09-18 DIAGNOSIS — Z7982 Long term (current) use of aspirin: Secondary | ICD-10-CM | POA: Diagnosis not present

## 2017-09-18 DIAGNOSIS — R197 Diarrhea, unspecified: Secondary | ICD-10-CM | POA: Diagnosis not present

## 2017-09-18 DIAGNOSIS — I2510 Atherosclerotic heart disease of native coronary artery without angina pectoris: Secondary | ICD-10-CM | POA: Diagnosis not present

## 2017-09-18 DIAGNOSIS — Z96651 Presence of right artificial knee joint: Secondary | ICD-10-CM | POA: Diagnosis not present

## 2017-09-18 DIAGNOSIS — Z79899 Other long term (current) drug therapy: Secondary | ICD-10-CM | POA: Insufficient documentation

## 2017-09-18 DIAGNOSIS — B9620 Unspecified Escherichia coli [E. coli] as the cause of diseases classified elsewhere: Secondary | ICD-10-CM | POA: Insufficient documentation

## 2017-09-18 DIAGNOSIS — Z951 Presence of aortocoronary bypass graft: Secondary | ICD-10-CM | POA: Insufficient documentation

## 2017-09-18 DIAGNOSIS — R112 Nausea with vomiting, unspecified: Secondary | ICD-10-CM | POA: Diagnosis not present

## 2017-09-18 DIAGNOSIS — I10 Essential (primary) hypertension: Secondary | ICD-10-CM | POA: Diagnosis not present

## 2017-09-18 DIAGNOSIS — I5022 Chronic systolic (congestive) heart failure: Secondary | ICD-10-CM | POA: Diagnosis not present

## 2017-09-18 DIAGNOSIS — A09 Infectious gastroenteritis and colitis, unspecified: Secondary | ICD-10-CM | POA: Diagnosis not present

## 2017-09-18 LAB — C DIFFICILE QUICK SCREEN W PCR REFLEX
C DIFFICILE (CDIFF) INTERP: NOT DETECTED
C DIFFICILE (CDIFF) TOXIN: NEGATIVE
C Diff antigen: NEGATIVE

## 2017-09-18 LAB — COMPREHENSIVE METABOLIC PANEL
ALT: 21 U/L (ref 17–63)
AST: 20 U/L (ref 15–41)
Albumin: 4 g/dL (ref 3.5–5.0)
Alkaline Phosphatase: 44 U/L (ref 38–126)
Anion gap: 7 (ref 5–15)
BUN: 18 mg/dL (ref 6–20)
CALCIUM: 8.7 mg/dL — AB (ref 8.9–10.3)
CHLORIDE: 105 mmol/L (ref 101–111)
CO2: 23 mmol/L (ref 22–32)
CREATININE: 0.92 mg/dL (ref 0.61–1.24)
Glucose, Bld: 105 mg/dL — ABNORMAL HIGH (ref 65–99)
Potassium: 3.7 mmol/L (ref 3.5–5.1)
Sodium: 135 mmol/L (ref 135–145)
Total Bilirubin: 1.4 mg/dL — ABNORMAL HIGH (ref 0.3–1.2)
Total Protein: 7.2 g/dL (ref 6.5–8.1)

## 2017-09-18 LAB — GASTROINTESTINAL PANEL BY PCR, STOOL (REPLACES STOOL CULTURE)
ASTROVIRUS: NOT DETECTED
Adenovirus F40/41: NOT DETECTED
CAMPYLOBACTER SPECIES: NOT DETECTED
CRYPTOSPORIDIUM: NOT DETECTED
Cyclospora cayetanensis: NOT DETECTED
ENTEROPATHOGENIC E COLI (EPEC): DETECTED — AB
ENTEROTOXIGENIC E COLI (ETEC): NOT DETECTED
Entamoeba histolytica: NOT DETECTED
Enteroaggregative E coli (EAEC): DETECTED — AB
Giardia lamblia: NOT DETECTED
Norovirus GI/GII: NOT DETECTED
PLESIMONAS SHIGELLOIDES: NOT DETECTED
ROTAVIRUS A: NOT DETECTED
Salmonella species: NOT DETECTED
Sapovirus (I, II, IV, and V): NOT DETECTED
Shiga like toxin producing E coli (STEC): NOT DETECTED
Shigella/Enteroinvasive E coli (EIEC): NOT DETECTED
Vibrio cholerae: NOT DETECTED
Vibrio species: NOT DETECTED
Yersinia enterocolitica: NOT DETECTED

## 2017-09-18 LAB — CBC WITH DIFFERENTIAL/PLATELET
BASOS PCT: 0 %
Basophils Absolute: 0 10*3/uL (ref 0.0–0.1)
EOS ABS: 0.3 10*3/uL (ref 0.0–0.7)
EOS PCT: 3 %
HCT: 46 % (ref 39.0–52.0)
Hemoglobin: 16.2 g/dL (ref 13.0–17.0)
Lymphocytes Relative: 15 %
Lymphs Abs: 1.5 10*3/uL (ref 0.7–4.0)
MCH: 31.8 pg (ref 26.0–34.0)
MCHC: 35.2 g/dL (ref 30.0–36.0)
MCV: 90.4 fL (ref 78.0–100.0)
MONO ABS: 1.3 10*3/uL (ref 0.1–1.0)
Monocytes Relative: 13 %
NEUTROS ABS: 6.9 10*3/uL (ref 1.7–7.7)
Neutrophils Relative %: 69 %
Platelets: 287 10*3/uL (ref 150–400)
RBC: 5.09 MIL/uL (ref 4.22–5.81)
RDW: 13.8 % (ref 11.5–15.5)
WBC: 10 10*3/uL (ref 4.0–10.5)

## 2017-09-18 LAB — LIPASE, BLOOD: LIPASE: 31 U/L (ref 11–51)

## 2017-09-18 MED ORDER — CIPROFLOXACIN HCL 500 MG PO TABS: 500.0000 mg | ORAL_TABLET | Freq: Two times a day (BID) | ORAL | 0 refills | Status: DC

## 2017-09-18 MED ORDER — SODIUM CHLORIDE 0.9 % IV BOLUS
500.0000 mL | Freq: Once | INTRAVENOUS | Status: AC
  Administered 2017-09-18: 500 mL via INTRAVENOUS

## 2017-09-18 MED ORDER — CIPROFLOXACIN HCL 500 MG PO TABS
500.0000 mg | ORAL_TABLET | Freq: Once | ORAL | Status: AC
  Administered 2017-09-18: 500 mg via ORAL
  Filled 2017-09-18: qty 1

## 2017-09-18 MED ORDER — ONDANSETRON HCL 4 MG PO TABS: 4.0000 mg | ORAL_TABLET | Freq: Three times a day (TID) | ORAL | 0 refills | Status: DC | PRN

## 2017-09-18 MED FILL — ONDANSETRON HCL 4 MG TABLET: 4 | 2 days supply | Qty: 8 | Fill #0

## 2017-09-18 MED FILL — CIPROFLOXACIN HCL 500 MG TA: 500 | 5 days supply | Qty: 10 | Fill #0

## 2017-09-18 NOTE — ED Notes (Signed)
Pt notified stool sample was positive for E Coli and proper treatment with cipro has already been given.

## 2017-09-18 NOTE — ED Provider Notes (Signed)
[facility] EMERGENCY DEPARTMENT Provider Note   CSN: [id number] Arrival date & time: 09/18/17  7829     History   Chief Complaint Chief Complaint  Patient presents with  . N/V/D    HPI Scott Roth is a 62 y.o. male.  The history is provided by the patient. No language interpreter was used.    Scott Roth is a 62 y.o. male who presents to the Emergency Department complaining of nausea, diarrhea. He presents for evaluation of nausea, diarrhea for the last six days. He travels frequently to [place] and came back about three weeks ago. Six days ago he developed nausea with profuse diarrhea. Bowel movements are every time he eats. He is unable to count the number of bowel movements he has. He denies any abdominal pain but feels like things are moving through there. He denies any fevers, vomiting, chest pain, shortness of breath. He states he feels very dehydrated. His girlfriend traveled with him and she has been sick with similar symptoms. She was evaluated and treated with ciprofloxacin and her diarrhea is completely resolved. He has a history of CHF, last EF on April 11 noted to be 35%. Symptoms are moderate, constant, worsening.  Past Medical History:  Diagnosis Date  . Arthritis   . CAD (coronary artery disease)    a. CABG x4 with LIMA to dLAD, free RIMA to OM1, SVG to D1, SVG to OM2 in 2006. b. PCI 2008 with notable ventricular fibrillation during cath) - DES to proxLAD/1stdiagonal. c. Cath 07/25/17 demonstrated previously placed stents, patent LIMA-LAD, patent free RIMA-OM, occluded VG-diagonal and occluded Y graft-OM2, without significant change from prior, elevated LVEDP.  . Cardiomyopathy ([place])    a. EF prev normal 2004, 2008. b. EF 35% by cath in 07/2017.  Marland Kitchen Chronic systolic CHF (congestive heart failure) ([place])   . Erectile dysfunction   . GERD (gastroesophageal reflux disease)   . Hyperlipidemia   . Hypertension   . Sleep apnea    does not wear  mask  . Tubular adenoma     Patient Active Problem List   Diagnosis Date Noted  . Unstable angina ([place])   . Chest pain with moderate risk of acute coronary syndrome 07/24/2017  . DJD (degenerative joint disease) of knee 08/25/2014  . Nocturia 08/31/2013  . Sleep apnea 08/31/2013  . Hypertension   . Hyperlipidemia   . CAD (coronary artery disease) of artery bypass graft     Past Surgical History:  Procedure Laterality Date  . 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 CORONARY ARTERY BYPASS GRAFT  2008  . EYE SURGERY Bilateral    Lasik  . KNEE ARTHROPLASTY Left   . LEFT HEART CATH AND CORS/GRAFTS ANGIOGRAPHY N/A 07/25/2017   Procedure: LEFT HEART CATH AND CORS/GRAFTS ANGIOGRAPHY;  Surgeon: Lorretta Harp, MD;  Location: [facility];  Service: Cardiovascular;  Laterality: N/A;  . SHOULDER SURGERY Right   . TOTAL KNEE ARTHROPLASTY Right 08/25/2014  . TOTAL KNEE ARTHROPLASTY Right 08/25/2014   Procedure: RIGHT TOTAL KNEE ARTHROPLASTY;  Surgeon: Kathryne Hitch, MD;  Location: [facility];  Service: Orthopedics;  Laterality: Right;        Home Medications    Prior to Admission medications   Medication Sig Start Date End Date Taking? Authorizing Provider  aspirin EC 81 MG tablet Take 1 tablet (81 mg total) by mouth daily. 07/18/17   Almyra Deforest, [place]  Coenzyme Q10 (CO Q-10) 300 MG CAPS Take 1 capsule by mouth daily. 04/01/14   Belva Crome, MD  Cyanocobalamin (B-12 PO) Take 1 tablet by mouth daily.     [provider]  furosemide (LASIX) 40 MG tablet TAKE 1 TABLET BY MOUTH EVERY DAY 08/20/17   Belva Crome, MD  GARLIC PO Take 1 tablet by mouth daily.    [provider]  isosorbide mononitrate (IMDUR) 60 MG 24 hr tablet Take 1 tablet (60 mg total) by mouth daily. 08/09/17 11/07/17  Dunn, Nedra Hai, PA-C  MAGNESIUM PO Take 1 tablet by mouth daily.    [provider]  metoprolol succinate (TOPROL-XL) 25 MG 24 hr  tablet Take 1 tablet (25 mg total) by mouth daily. 09/11/17   Dunn, Nedra Hai, PA-C  Multiple Vitamins-Minerals (MULTIVITAMIN PO) Take 1 tablet by mouth daily.     [provider]  nitroGLYCERIN (NITROSTAT) 0.4 MG SL tablet PLACE 1 TABLET UNDER THE TONGUE EVERY 5 (FIVE) MINUTES X 3 DOSES AS NEEDED FOR CHEST PAIN. 08/20/17   Belva Crome, MD  pantoprazole (PROTONIX) 40 MG tablet Take 40 mg by mouth daily.    [provider]  Pyridoxine HCl (B-6 PO) Take 1 tablet by mouth daily.     [provider]  rosuvastatin (CRESTOR) 40 MG tablet Take 1 tablet (40 mg total) by mouth daily. 07/18/17 07/13/18  Almyra Deforest, PA  sacubitril-valsartan (ENTRESTO) 97-103 MG Take 1 tablet by mouth 2 (two) times daily. 09/12/17   Dunn, Nedra Hai, PA-C  TURMERIC PO Take 1 tablet by mouth daily.    [provider]  vitamin C (ASCORBIC ACID) 500 MG tablet Take 500 mg by mouth daily.    [provider]  VITAMIN E PO Take 1 tablet by mouth daily.     [provider]    Family History Family History  Problem Relation Age of Onset  . Heart failure Father   . Hypertension Mother     Social History Social History   Tobacco Use  . Smoking status: Never Smoker  . Smokeless tobacco: Never Used  Substance Use Topics  . Alcohol use: No  . Drug use: No     Allergies   Penicillins   Review of Systems Review of Systems  All other systems reviewed and are negative.    Physical Exam Updated Vital Signs BP 126/75 (BP Location: Right Arm)   Pulse 78   Resp 20   Ht 5\' 7"  (1.702 m)   Wt 86.6 kg (191 lb)   SpO2 96%   BMI 29.91 kg/m   Physical Exam  Constitutional: He is oriented to person, place, and time. He appears well-developed and well-nourished.  HENT:  Head: Normocephalic and atraumatic.  Cardiovascular: Normal rate and regular rhythm.  No murmur heard. Pulmonary/Chest: Effort normal and breath sounds normal. No respiratory distress.  Abdominal: Soft.  There is no tenderness. There is no rebound and no guarding.  Musculoskeletal: He exhibits no edema or tenderness.  Neurological: He is alert and oriented to person, place, and time.  Skin: Skin is warm and dry.  Psychiatric: He has a normal mood and affect. His behavior is normal.  Nursing note and vitals reviewed.    ED Treatments / Results  Labs (all labs ordered are listed, but only abnormal results are displayed) Labs Reviewed  COMPREHENSIVE METABOLIC PANEL - Abnormal; Notable for the following components:      Result Value   Glucose, Bld  105 (*)    Calcium 8.7 (*)    Total Bilirubin 1.4 (*)    All other components within normal limits  GASTROINTESTINAL PANEL BY PCR, STOOL (REPLACES STOOL CULTURE)  C DIFFICILE QUICK SCREEN W PCR REFLEX  CBC WITH DIFFERENTIAL/PLATELET  LIPASE, BLOOD    EKG None  Radiology No results found.  Procedures Procedures (including critical care time)  Medications Ordered in ED Medications  sodium chloride 0.9 % bolus 500 mL (500 mLs Intravenous New Bag/Given 09/18/17 0801)  ciprofloxacin (CIPRO) tablet 500 mg (has no administration in time range)     Initial Impression / Assessment and Plan / ED Course  I have reviewed the triage vital signs and the nursing notes.  Pertinent labs & imaging results that were available during my care of the patient were reviewed by me and considered in my medical decision making (see chart for details).     Patient with history of CHF here for evaluation of diarrhea for the last six days. He is non-toxic appearing in no acute distress. He does appear mildly dehydrated and he was treated with IV fluids. Abdominal exam is soft and nontender. Presentation is not consistent with acute diverticulitis. Given persistent symptoms and history of recent travel will treat for travelers diarrhea with ciprofloxacin. Will send stool studies for other pathogens. Discussed with patient close home care and return  precautions.  Final Clinical Impressions(s) / ED Diagnoses   Final diagnoses:  None    ED Discharge Orders    None       Quintella Reichert, MD 09/18/17 [phone]

## 2017-09-18 NOTE — ED Notes (Signed)
Pt tolerating po challenge (water).

## 2017-09-18 NOTE — ED Triage Notes (Signed)
For the last six days, has been having n/v/d . Girlfriend has had a stomach 'bug" as well

## 2017-09-19 ENCOUNTER — Telehealth: Payer: Self-pay | Admitting: Interventional Cardiology

## 2017-09-19 NOTE — Telephone Encounter (Signed)
New Message:       Pt is calling and states that he needs a form filled out for him to apply for  Social Security Disability. Pt has and appt on 6/18 and he will get then or if we can provide it before hand he would appreciate it

## 2017-09-19 NOTE — Telephone Encounter (Signed)
Spoke with the patient re: his request for disability since his friend is getting disability he would like to consider trying to get it also. I advised him to contact SS Disability office to find out the criteria and he may want to further discuss at his next office visit 10/01/17. I advised him that the forms are not kept here at our office, I advised him to speak to his employer but he is self employed. Patient is going to look into it further and discuss with Estella Husk NP at his visit.

## 2017-09-20 ENCOUNTER — Other Ambulatory Visit: Payer: Self-pay | Admitting: Cardiology

## 2017-09-22 ENCOUNTER — Other Ambulatory Visit: Payer: Self-pay | Admitting: Physician Assistant

## 2017-09-23 MED ORDER — SACUBITRIL-VALSARTAN 97-103 MG PO TABS: 1.0000 | ORAL_TABLET | Freq: Two times a day (BID) | ORAL | 3 refills | Status: DC

## 2017-09-25 ENCOUNTER — Other Ambulatory Visit: Payer: Self-pay | Admitting: Cardiology

## 2017-09-25 NOTE — Telephone Encounter (Signed)
Outpatient Medication Detail    Disp Refills Start End   metoprolol succinate (TOPROL-XL) 25 MG 24 hr tablet 90 tablet 3 09/11/2017    Sig - Route: Take 1 tablet (25 mg total) by mouth daily. - Oral   Sent to pharmacy as: metoprolol succinate (TOPROL-XL) 25 MG 24 hr tablet   E-Prescribing Status: Receipt confirmed by pharmacy (09/11/2017 11:19 AM EDT)   Pharmacy   [id number] - [place], [place] - [place]

## 2017-09-28 ENCOUNTER — Other Ambulatory Visit: Payer: Self-pay | Admitting: Physician Assistant

## 2017-09-30 DIAGNOSIS — I255 Ischemic cardiomyopathy: Secondary | ICD-10-CM | POA: Insufficient documentation

## 2017-09-30 NOTE — Progress Notes (Deleted)
Cardiology Office Note    Date:  09/30/2017   ID:  Scott Roth, DOB 1955-11-10, MRN [id number]  PCP:  Manfred Shirts, PA  Cardiologist: Sinclair Grooms, MD  No chief complaint on file.   History of Present Illness:  Scott Roth is a 62 y.o. male with history of CAD status post CABG x4 with a LIMA to the LAD, free RIMA to OM1, SVG to diagonal 1, SVG to OM 05/2004, DES to the LAD/first diagonal 2008 with ventricular fibrillation during cath. Patient had recurrent chest pain and abnormal nuclear stress test with a drop in LVEF to 33%.  Patient initially declined cath because of history of V. fib in the Cath Lab.  He continued to have chest pain and mildly elevated troponin so underwent cardiac cath 07/25/2017.  Stents were patent, patent LIMA to the LAD, patent free RIMA to the OM, occluded SVG to the diagonal and occluded Y graft to OM 2, without significant change from prior cath.  He had an elevated LVEDP EF 35%.  He has been seen by Sharrell Ku, PA-C several times and Delene Loll has been titrated up.  Past Medical History:  Diagnosis Date  . Arthritis   . CAD (coronary artery disease)    a. CABG x4 with LIMA to dLAD, free RIMA to OM1, SVG to D1, SVG to OM2 in 2006. b. PCI 2008 with notable ventricular fibrillation during cath) - DES to proxLAD/1stdiagonal. c. Cath 07/25/17 demonstrated previously placed stents, patent LIMA-LAD, patent free RIMA-OM, occluded VG-diagonal and occluded Y graft-OM2, without significant change from prior, elevated LVEDP.  . Cardiomyopathy ([place])    a. EF prev normal 2004, 2008. b. EF 35% by cath in 07/2017.  Marland Kitchen Chronic systolic CHF (congestive heart failure) ([place])   . Erectile dysfunction   . GERD (gastroesophageal reflux disease)   . Hyperlipidemia   . Hypertension   . Sleep apnea    does not wear mask  . Tubular adenoma     Past Surgical History:  Procedure Laterality Date  . CARDIAC CATHETERIZATION     X 1 stent in 2008 due to heart valve  collapse after CABG  . CORONARY ARTERY BYPASS GRAFT  2008  . EYE SURGERY Bilateral    Lasik  . KNEE ARTHROPLASTY Left   . LEFT HEART CATH AND CORS/GRAFTS ANGIOGRAPHY N/A 07/25/2017   Procedure: LEFT HEART CATH AND CORS/GRAFTS ANGIOGRAPHY;  Surgeon: Lorretta Harp, MD;  Location: [facility];  Service: Cardiovascular;  Laterality: N/A;  . SHOULDER SURGERY Right   . TOTAL KNEE ARTHROPLASTY Right 08/25/2014  . TOTAL KNEE ARTHROPLASTY Right 08/25/2014   Procedure: RIGHT TOTAL KNEE ARTHROPLASTY;  Surgeon: Kathryne Hitch, MD;  Location: [facility];  Service: Orthopedics;  Laterality: Right;    Current Medications: No outpatient medications have been marked as taking for the 10/01/17 encounter (Appointment) with [name], PA-C.     Allergies:   Penicillins   Social History   Socioeconomic History  . Marital status: Single    Spouse name: Not on file  . Number of children: Not on file  . Years of education: Not on file  . Highest education level: Not on file  Occupational History  . Not on file  Social Needs  . Financial resource strain: Not on file  . Food insecurity:    Worry: Not on file    Inability: Not on file  . Transportation needs:    Medical: Not on file  Non-medical: Not on file  Tobacco Use  . Smoking status: Never Smoker  . Smokeless tobacco: Never Used  Substance and Sexual Activity  . Alcohol use: No  . Drug use: No  . Sexual activity: Not on file  Lifestyle  . Physical activity:    Days per week: Not on file    Minutes per session: Not on file  . Stress: Not on file  Relationships  . Social connections:    Talks on phone: Not on file    Gets together: Not on file    Attends religious service: Not on file    Active member of club or organization: Not on file    Attends meetings of clubs or organizations: Not on file    Relationship status: Not on file  Other Topics Concern  . Not on file  Social History Narrative  . Not on file      Family History:  The patient's ***family history includes Heart failure in his father; Hypertension in his mother.   ROS:   Please see the history of present illness.    ROS All other systems reviewed and are negative.   PHYSICAL EXAM:   VS:  There were no vitals taken for this visit.  Physical Exam  GEN: Well nourished, well developed, in no acute distress  HEENT: normal  Neck: no JVD, carotid bruits, or masses Cardiac:RRR; no murmurs, rubs, or gallops  Respiratory:  clear to auscultation bilaterally, normal work of breathing GI: soft, nontender, nondistended, + BS Ext: without cyanosis, clubbing, or edema, Good distal pulses bilaterally MS: no deformity or atrophy  Skin: warm and dry, no rash Neuro:  Alert and Oriented x 3, Strength and sensation are intact Psych: euthymic mood, full affect  Wt Readings from Last 3 Encounters:  09/18/17 191 lb (86.6 kg)  09/11/17 201 lb (91.2 kg)  08/08/17 202 lb (91.6 kg)      Studies/Labs Reviewed:   EKG:  EKG is*** ordered today.  The ekg ordered today demonstrates ***  Recent Labs: 09/11/2017: NT-Pro BNP 138 09/18/2017: ALT 21; BUN 18; Creatinine, Ser 0.92; Hemoglobin 16.2; Platelets 287; Potassium 3.7; Sodium 135   Lipid Panel    Component Value Date/Time   CHOL 98 07/24/2017 1924   CHOL 157 07/15/2017 0740   TRIG 107 07/24/2017 1924   HDL 37 (L) 07/24/2017 1924   HDL 46 07/15/2017 0740   CHOLHDL 2.6 07/24/2017 1924   VLDL 21 07/24/2017 1924   LDLCALC 40 07/24/2017 1924   [place] 85 07/15/2017 0740    Additional studies/ records that were reviewed today include:   Cardiac catheterization 4/11/2019Conclusion      Prox Cx lesion is 100% stenosed.  Ost LAD to Prox LAD lesion is 50% stenosed.  Previously placed Ost 1st Diag stent (unknown type) is widely patent.  Previously placed Prox LAD stent (unknown type) is widely patent.  Prox LAD to Mid LAD lesion is 95% stenosed.  Origin to Prox Graft lesion is 100%  stenosed.  There is moderate to severe left ventricular systolic dysfunction.  LV end diastolic pressure is moderately elevated.  The left ventricular ejection fraction is 35-45% by visual estimate.   Scott Roth is a 62 y.o. male       Nuclear stress test 4/1/2019Study Highlights      Nuclear stress EF: 33%.  There was no ST segment deviation noted during stress.  Defect 1: There is a medium defect of severe severity present in the apical anterior,  apical lateral and apex location.  Findings consistent with prior myocardial infarction.  This is a high risk study.  The left ventricular ejection fraction is moderately decreased (30-44%).   There is a medium size, irreversible defect in the apical anterior, lateral walls and in the true apex consistent with prior infarct. The study is a high risk given severely decreased LVEF of 33%, but no ischemia is present.         ASSESSMENT:    1. Coronary artery disease involving coronary bypass graft of native heart without angina pectoris   2. Ischemic cardiomyopathy   3. Essential hypertension   4. Hyperlipidemia, unspecified hyperlipidemia type      PLAN:  In order of problems listed above:  CAD status post CABG x4 in 2006, DES to the proximal LAD/diagonal 04/2006 with V. fib arrest, recent decline in LV function prompting cath 07/25/2017 patent stents, patent LIMA to the LAD, patent free RIMA to the OM, occluded SVG to the diagonal and occluded SVG to OM 2.  LVD EF 35% titrating medications  Ischemic cardiomyopathy LVEF now 35% NYHA class  Essential hypertension  Hyperlipidemia    Medication Adjustments/Labs and Tests Ordered: Current medicines are reviewed at length with the patient today.  Concerns regarding medicines are outlined above.  Medication changes, Labs and Tests ordered today are listed in the Patient Instructions below. There are no Patient Instructions on file for this visit.   Sumner Boast, PA-C  09/30/2017 3:49 PM    [facility] [address], [place], [place]  [address] Phone: [phone]; Fax: [phone]

## 2017-09-30 NOTE — Telephone Encounter (Signed)
Outpatient Medication Detail    Disp Refills Start End   sacubitril-valsartan (ENTRESTO) 97-103 MG 180 tablet 3 09/23/2017    Sig - Route: Take 1 tablet by mouth 2 (two) times daily. - Oral   Sent to pharmacy as: sacubitril-valsartan (ENTRESTO) 97-103 MG   E-Prescribing Status: Receipt confirmed by pharmacy (09/23/2017 3:01 PM EDT)   Pharmacy   [id number] - [place], [place] - [place]

## 2017-10-01 ENCOUNTER — Ambulatory Visit: Payer: BLUE CROSS/BLUE SHIELD | Admitting: Physician Assistant

## 2017-10-03 ENCOUNTER — Other Ambulatory Visit: Payer: Self-pay

## 2017-10-07 ENCOUNTER — Encounter

## 2017-10-07 ENCOUNTER — Ambulatory Visit: Payer: BLUE CROSS/BLUE SHIELD | Admitting: Gastroenterology

## 2017-10-14 ENCOUNTER — Telehealth: Payer: Self-pay | Admitting: Interventional Cardiology

## 2017-10-14 NOTE — Telephone Encounter (Signed)
Pt states he forgot to take his Metoprolol at 6pm last night so he just took it along with his Entresto at 9pm.  Normally takes Entresto between 8p-10p.  This morning pt was dizzy and didn't feel well.  Took vitals and BP was 129/87, HR 59.  Pt feeling much better now.  States this is the only time this has happened.  Advised pt to continue to monitor and let me know if this happens again.  Advised to try not to take Metoprolol and Entresto at the same time since it makes him feel bad.  Pt in agreement with plan and was appreciative for call.  Will route to Dr. Tamala Julian to see if further recommendations.

## 2017-10-14 NOTE — Telephone Encounter (Signed)
Left message to call back  

## 2017-10-14 NOTE — Telephone Encounter (Signed)
New Message    STAT if HR is under 50 or over 120 (normal HR is 60-100 beats per minute)  1) What is your heart rate? HR 59  2) Do you have a log of your heart rate readings (document readings)? Not available   3) Do you have any other symptoms?  Feeling dizzy

## 2017-10-15 NOTE — Telephone Encounter (Signed)
Agree 

## 2017-10-28 DIAGNOSIS — I5022 Chronic systolic (congestive) heart failure: Secondary | ICD-10-CM | POA: Insufficient documentation

## 2017-10-28 NOTE — Progress Notes (Signed)
Cardiology Office Note:    Date:  10/29/2017   ID:  Scott Roth, DOB 1955-12-07, MRN [id number]  PCP:  Scott Shirts, PA  Cardiologist:  Scott Grooms, MD    Referring MD: Scott Roth, [place]   Chief Complaint  Patient presents with  . Follow-up    CHF     History of Present Illness:    Scott Roth is a 62 y.o. male with coronary artery disease, s/p CABG in 2006 and subsequent PCI with DES to the LAD/D1 in 5009 (cath complicated by VF), systolic CHF, hypertension, hyperlipidemia, OSA not on CPAP.  Nuclear stress test in 4/19, done for chest pain, demonstrated newly depressed LVF and Cardiac Catheterization was recommended.  Cardiac Catheterization in 4/19 demonstrated patent L-LAD, patent RIMA-OM1, occluded S-Dx and an occluded S-OM2.  Stents to the LAD and Dx were patent.  Medical Rx was continued.  Last seen by Scott Copa, PA-C in 5/19.  Entresto was increased.    [name] returns for follow up.  He is here with his wife.  He has been traveling a lot and went to [place] shortly after seeing Scott Roth in May 2019.  He has a lot of questions about his medications.  He is not exactly sure what he should be taking.  He feels like his medications are changed every time he comes in to the office.  He is no longer on Furosemide or Isosorbide.  He has not taken these medications in months.  He has had no issues with weight gain, swelling, shortness of breath, paroxysmal nocturnal dyspnea.  He does wheeze sometimes.  He has not had chest pain, syncope.  He was getting quite dizzy when he took Entresto earlier in the day.  However, now he takes Entresto at 12 pm and 12 midnight.  He tolerates this much better.  Of note, he leaves again to go out of the country 7/22.    Prior CV studies:   The following studies were reviewed today:  Cardiac Catheterization 07/25/17 LAD ost 50, prox stent patent, mid 95; D1 ost stent patent LCx 100 L-LAD patent S-D1 100 RIMA-OM1 patent EF 35-45,  ant-apical HK Diagnostic Diagram     Nuclear stress test 07/15/17 EF 33, There is a medium size, irreversible defect in the apical anterior, lateral walls and in the true apex consistent with prior infarct. The study is a high risk given severely decreased LVEF of 33%, but no ischemia is present.   Past Medical History:  Diagnosis Date  . Arthritis   . CAD (coronary artery disease)    a. CABG x4 with LIMA to dLAD, free RIMA to OM1, SVG to D1, SVG to OM2 in 2006. b. PCI 2008 with notable ventricular fibrillation during cath) - DES to proxLAD/1stdiagonal. c. Cath 07/25/17 demonstrated previously placed stents, patent LIMA-LAD, patent free RIMA-OM, occluded VG-diagonal and occluded Y graft-OM2, without significant change from prior, elevated LVEDP.  . Cardiomyopathy ([place])    a. EF prev normal 2004, 2008. b. EF 35% by cath in 07/2017.  Marland Kitchen Chronic systolic CHF (congestive heart failure) ([place])   . Erectile dysfunction   . GERD (gastroesophageal reflux disease)   . Hyperlipidemia   . Hypertension   . Sleep apnea    does not wear mask  . Tubular adenoma    Surgical Hx: The patient  has a past surgical history that includes Knee Arthroplasty (Left); Eye surgery (Bilateral); Shoulder surgery (Right); Coronary artery bypass graft (2008); Cardiac  catheterization; Total knee arthroplasty (Right, 08/25/2014); Total knee arthroplasty (Right, 08/25/2014); and LEFT HEART CATH AND CORS/GRAFTS ANGIOGRAPHY (N/A, 07/25/2017).   Current Medications: Current Meds  Medication Sig  . aspirin EC 81 MG tablet Take 1 tablet (81 mg total) by mouth daily.  . Coenzyme Q10 (CO Q-10) 300 MG CAPS Take 1 capsule by mouth daily.  . Cyanocobalamin (B-12 PO) Take 1 tablet by mouth daily.   . furosemide (LASIX) 20 MG tablet Take 1 tablet (20 mg total) by mouth as needed (Sweeling , weight gain or shortness of breath).  . GARLIC PO Take 1 tablet by mouth daily.  Marland Kitchen MAGNESIUM PO Take 500 mg by mouth daily.   . metoprolol  succinate (TOPROL-XL) 25 MG 24 hr tablet Take 1 tablet (25 mg total) by mouth daily.  . Multiple Vitamins-Minerals (MULTIVITAMIN PO) Take 1 tablet by mouth daily.   . nitroGLYCERIN (NITROSTAT) 0.4 MG SL tablet PLACE 1 TABLET UNDER THE TONGUE EVERY 5 (FIVE) MINUTES X 3 DOSES AS NEEDED FOR CHEST PAIN.  . rosuvastatin (CRESTOR) 40 MG tablet Take 1 tablet (40 mg total) by mouth daily.  . sacubitril-valsartan (ENTRESTO) 97-103 MG Take 1 tablet by mouth 2 (two) times daily.  . TURMERIC PO Take 500 mg by mouth daily.   . vitamin C (ASCORBIC ACID) 500 MG tablet Take 500 mg by mouth daily.  . Vitamin D, Ergocalciferol, (DRISDOL) 50000 units CAPS capsule TAKE 1 CAPSULE ONCE PER WEEK  . VITAMIN E PO Take 400 mg by mouth daily.   . [DISCONTINUED] metoprolol succinate (TOPROL-XL) 25 MG 24 hr tablet Take 1 tablet (25 mg total) by mouth daily.  . [DISCONTINUED] rosuvastatin (CRESTOR) 40 MG tablet Take 1 tablet (40 mg total) by mouth daily.  . [DISCONTINUED] sacubitril-valsartan (ENTRESTO) 97-103 MG Take 1 tablet by mouth 2 (two) times daily.     Allergies:   Penicillins   Social History   Tobacco Use  . Smoking status: Never Smoker  . Smokeless tobacco: Never Used  Substance Use Topics  . Alcohol use: No  . Drug use: No     Family Hx: The patient's family history includes Heart failure in his father; Hypertension in his mother.  ROS:   Please see the history of present illness.    ROS All other systems reviewed and are negative.   EKGs/Labs/Other Test Reviewed:    EKG:  EKG is not  ordered today.    Recent Labs: 09/11/2017: NT-Pro BNP 138 09/18/2017: ALT 21; BUN 18; Creatinine, Ser 0.92; Hemoglobin 16.2; Platelets 287; Potassium 3.7; Sodium 135   Recent Lipid Panel Lab Results  Component Value Date/Time   CHOL 98 07/24/2017 07:24 PM   CHOL 157 07/15/2017 07:40 AM   TRIG 107 07/24/2017 07:24 PM   HDL 37 (L) 07/24/2017 07:24 PM   HDL 46 07/15/2017 07:40 AM   CHOLHDL 2.6 07/24/2017 07:24  PM   LDLCALC 40 07/24/2017 07:24 PM   LDLCALC 85 07/15/2017 07:40 AM   From KPN Tool: Cholesterol, total  98.000  07/24/2017 HDL    37.000  07/24/2017 LDL    40.000  07/24/2017 Triglycerides   107.000  07/24/2017 Hemoglobin   16.200  09/18/2017 Creatinine, Serum  0.920   09/18/2017 Potassium   3.700   09/18/2017  ALT (SGPT)   21.000  09/18/2017 INR    0.950   07/24/2017 Platelets   287.000  09/18/2017   Physical Exam:    VS:  BP 108/76   Pulse 85   Ht  5\' 7"  (1.702 m)   Wt 200 lb 3.2 oz (90.8 kg)   SpO2 94%   BMI 31.36 kg/m     Wt Readings from Last 3 Encounters:  10/29/17 200 lb 3.2 oz (90.8 kg)  09/18/17 191 lb (86.6 kg)  09/11/17 201 lb (91.2 kg)     Physical Exam  Constitutional: He is oriented to person, place, and time. He appears well-developed and well-nourished. No distress.  HENT:  Head: Normocephalic and atraumatic.  Eyes: No scleral icterus.  Neck: Neck supple. No JVD present.  Cardiovascular: Normal rate, regular rhythm, S1 normal and S2 normal.  No murmur heard. Pulmonary/Chest: Breath sounds normal. He has no rales.  Abdominal: Soft. There is no hepatomegaly.  Musculoskeletal: He exhibits no edema.  Neurological: He is alert and oriented to person, place, and time.  Skin: Skin is warm and dry.  Psychiatric: He has a normal mood and affect.    ASSESSMENT & PLAN:    Coronary artery disease involving native coronary artery of native heart without angina pectoris Hx of CABG in 2006 and PCI with DES to the native LAD/Dx in 2008.  Recent Cardiac Catheterization was done due to chest pain and worsening ejection fraction on Nuclear stress test.  He had 2/4 grafts patent and his LAD stent was patent.  Medical therapy was recommended.  He denies chest pain.  Continue ASA, beta-blocker, statin.  Chronic systolic CHF (congestive heart failure) ([facility]) EF 33 on Nuclear stress test and 35-45 on Cardiac Catheterization in 07/2017.  He is on low dose beta-blocker with metoprolol  succinate and angiotensin receptor blocker in the form of Entresto (angiotensin receptor neprilysin inhibitor).  He has had some lightheadedness and dizziness with the high dose Entresto.  However, he is currently tolerating it well.  I do not think he will be able to tolerate changing Metoprolol to Carvedilol or adding Spironolactone.    -Continue current dose of Metoprolol succinate and Entresto.    -DC Isosorbide (he has not taken in months)  -Arrange follow up echocardiogram.    -If EF < 35%, refer to EP for consideration of ICD.  -Take Lasix 20 mg QD prn for weight gain, swelling AND shortness of breath   Essential hypertension The patient's blood pressure is controlled on his current regimen.  Continue current therapy.    Hyperlipidemia, unspecified hyperlipidemia type LDL optimal on most recent lab work.  Continue current Rx.    Dispo:  Return in about 3 months (around 01/29/2018) for Routine Follow Up w/ Dr. Tamala Julian.   Medication Adjustments/Labs and Tests Ordered: Current medicines are reviewed at length with the patient today.  Concerns regarding medicines are outlined above.  Tests Ordered: Orders Placed This Encounter  Procedures  . ECHOCARDIOGRAM COMPLETE   Medication Changes: Meds ordered this encounter  Medications  . furosemide (LASIX) 20 MG tablet    Sig: Take 1 tablet (20 mg total) by mouth as needed (Sweeling , weight gain or shortness of breath).    Dispense:  30 tablet    Refill:  4  . rosuvastatin (CRESTOR) 40 MG tablet    Sig: Take 1 tablet (40 mg total) by mouth daily.    Dispense:  90 tablet    Refill:  3  . metoprolol succinate (TOPROL-XL) 25 MG 24 hr tablet    Sig: Take 1 tablet (25 mg total) by mouth daily.    Dispense:  90 tablet    Refill:  3  . sacubitril-valsartan (ENTRESTO) 97-103 MG  Sig: Take 1 tablet by mouth 2 (two) times daily.    Dispense:  180 tablet    Refill:  3    Signed, Richardson Dopp, PA-C  10/29/2017 2:19 PM    [facility] [place], [place], [place]  [address] Phone: [phone]; Fax: [phone]

## 2017-10-29 ENCOUNTER — Encounter (INDEPENDENT_AMBULATORY_CARE_PROVIDER_SITE_OTHER): Payer: Self-pay

## 2017-10-29 ENCOUNTER — Encounter: Payer: Self-pay | Admitting: Physician Assistant

## 2017-10-29 ENCOUNTER — Ambulatory Visit (INDEPENDENT_AMBULATORY_CARE_PROVIDER_SITE_OTHER): Payer: BLUE CROSS/BLUE SHIELD | Admitting: Physician Assistant

## 2017-10-29 VITALS — BP 108/76 | HR 85 | Ht 67.0 in | Wt 200.2 lb

## 2017-10-29 DIAGNOSIS — I5022 Chronic systolic (congestive) heart failure: Secondary | ICD-10-CM | POA: Diagnosis not present

## 2017-10-29 DIAGNOSIS — I10 Essential (primary) hypertension: Secondary | ICD-10-CM

## 2017-10-29 DIAGNOSIS — E785 Hyperlipidemia, unspecified: Secondary | ICD-10-CM

## 2017-10-29 DIAGNOSIS — I2510 Atherosclerotic heart disease of native coronary artery without angina pectoris: Secondary | ICD-10-CM

## 2017-10-29 MED ORDER — SACUBITRIL-VALSARTAN 97-103 MG PO TABS: 1.0000 | ORAL_TABLET | Freq: Two times a day (BID) | ORAL | 3 refills | Status: DC

## 2017-10-29 MED ORDER — FUROSEMIDE 20 MG PO TABS: 20.0000 mg | ORAL_TABLET | ORAL | 4 refills | Status: DC | PRN

## 2017-10-29 MED ORDER — METOPROLOL SUCCINATE ER 25 MG PO TB24: 25.0000 mg | ORAL_TABLET | Freq: Every day | ORAL | 3 refills | Status: DC

## 2017-10-29 MED ORDER — ROSUVASTATIN CALCIUM 40 MG PO TABS: 40.0000 mg | ORAL_TABLET | Freq: Every day | ORAL | 3 refills | Status: DC

## 2017-10-29 NOTE — Patient Instructions (Addendum)
Medication Instructions: Your physician has recommended you make the following change in your medication:  START : Lasix 20 mg on as needed if you have weight gain, Shortness of breath or Swelling    Labwork: None Ordered  Procedures/Testing: Your physician has requested that you have an echocardiogram. Echocardiography is a painless test that uses sound waves to create images of your heart. It provides your doctor with information about the size and shape of your heart and how well your heart's chambers and valves are working. This procedure takes approximately one hour. There are no restrictions for this procedure.    Follow-Up: Your physician recommends that you schedule a follow-up appointment in: 3 months with Dr. Tamala Julian    Any Additional Special Instructions Will Be Listed Below (If Applicable).  Weigh daily. If your weight is up 3 pounds in a day or 5 pounds in 1 week and you have leg swelling or shortness of breath take lasix. Call our office if you have improvement. [phone]   If you need a refill on your cardiac medications before your next appointment, please call your pharmacy.

## 2017-11-26 ENCOUNTER — Ambulatory Visit (HOSPITAL_COMMUNITY): Payer: BLUE CROSS/BLUE SHIELD | Attending: Physician Assistant

## 2017-12-06 ENCOUNTER — Telehealth: Payer: Self-pay | Admitting: Interventional Cardiology

## 2017-12-06 ENCOUNTER — Other Ambulatory Visit: Payer: Self-pay

## 2017-12-06 ENCOUNTER — Ambulatory Visit (HOSPITAL_COMMUNITY): Payer: BLUE CROSS/BLUE SHIELD | Attending: Cardiology

## 2017-12-06 DIAGNOSIS — E785 Hyperlipidemia, unspecified: Secondary | ICD-10-CM | POA: Diagnosis not present

## 2017-12-06 DIAGNOSIS — I2510 Atherosclerotic heart disease of native coronary artery without angina pectoris: Secondary | ICD-10-CM | POA: Insufficient documentation

## 2017-12-06 DIAGNOSIS — I5022 Chronic systolic (congestive) heart failure: Secondary | ICD-10-CM | POA: Diagnosis not present

## 2017-12-06 DIAGNOSIS — I429 Cardiomyopathy, unspecified: Secondary | ICD-10-CM | POA: Insufficient documentation

## 2017-12-06 DIAGNOSIS — I083 Combined rheumatic disorders of mitral, aortic and tricuspid valves: Secondary | ICD-10-CM | POA: Insufficient documentation

## 2017-12-06 DIAGNOSIS — Z951 Presence of aortocoronary bypass graft: Secondary | ICD-10-CM | POA: Diagnosis not present

## 2017-12-06 DIAGNOSIS — G4733 Obstructive sleep apnea (adult) (pediatric): Secondary | ICD-10-CM | POA: Diagnosis not present

## 2017-12-06 DIAGNOSIS — I110 Hypertensive heart disease with heart failure: Secondary | ICD-10-CM | POA: Diagnosis not present

## 2017-12-06 NOTE — Telephone Encounter (Signed)
Spoke with pt and he states about 2 nights ago he had severe pain in his kidneys, "felt like he was being beat with sticks", became dizzy and nauseous.  Denies severe pain now, just a slight discomfort.  Inquired about PRN Furosemide use.  Pt states he has been taking this daily for awhile now.  He said he thinks he started it because he gained about 5lbs and never stopped taking it.  Pt states BP and HR are always normal but didn't have values.  Advised pt Furosemide was only meant to be for wt gain, SOB or swelling.  Advised if wt gain he should stop once back to baseline.  Advised pt we need to check a BMET since he has been taking so much Furosemide.  Asked pt to hydrate this weekend if he feels thirsty since his EF is below normal.  Pt agreeable to come in Monday for labs.  Advised if severe pain returns he should report to ER for eval.  Will route to Dr. Tamala Julian for review and to see if further recommendations.  Pt also had an echo today.

## 2017-12-06 NOTE — Telephone Encounter (Signed)
12/06/2017 10:22 AM Cheron Schaumann [facility] Triage Patient Calls    Comment: Patient states that he is experiencing low back (kidney) due to medications Dr.Smith put him on. Please call patient to discuss his medications.   Left message to call back

## 2017-12-06 NOTE — Telephone Encounter (Signed)
Left message to call back  

## 2017-12-06 NOTE — Telephone Encounter (Signed)
° ° °  Nurse not available Please return call to patient

## 2017-12-09 ENCOUNTER — Encounter: Payer: Self-pay | Admitting: Physician Assistant

## 2017-12-09 ENCOUNTER — Other Ambulatory Visit: Payer: BLUE CROSS/BLUE SHIELD | Admitting: *Deleted

## 2017-12-09 DIAGNOSIS — I5022 Chronic systolic (congestive) heart failure: Secondary | ICD-10-CM | POA: Diagnosis not present

## 2017-12-09 NOTE — Telephone Encounter (Signed)
Furosemide use will not cause back pain. He needs to see PCP to work thru this.

## 2017-12-10 LAB — BASIC METABOLIC PANEL
BUN / CREAT RATIO: 12 (ref 10–24)
BUN: 13 mg/dL (ref 8–27)
CO2: 22 mmol/L (ref 20–29)
CREATININE: 1.07 mg/dL (ref 0.76–1.27)
Calcium: 9 mg/dL (ref 8.6–10.2)
Chloride: 103 mmol/L (ref 96–106)
GFR calc non Af Amer: 74 mL/min/{1.73_m2} (ref >59)
GFR, EST AFRICAN AMERICAN: 86 mL/min/{1.73_m2} (ref >59)
GLUCOSE: 115 mg/dL — AB (ref 65–99)
Potassium: 4.6 mmol/L (ref 3.5–5.2)
Sodium: 140 mmol/L (ref 134–144)

## 2017-12-10 NOTE — Telephone Encounter (Signed)
Spoke with pt and made him aware of Dr. Smith's recommendations.  Pt appreciative for call.  

## 2017-12-18 ENCOUNTER — Ambulatory Visit (INDEPENDENT_AMBULATORY_CARE_PROVIDER_SITE_OTHER): Payer: BLUE CROSS/BLUE SHIELD | Admitting: Gastroenterology

## 2017-12-18 ENCOUNTER — Other Ambulatory Visit: Payer: BLUE CROSS/BLUE SHIELD

## 2017-12-18 ENCOUNTER — Encounter: Payer: Self-pay | Admitting: Gastroenterology

## 2017-12-18 VITALS — BP 122/78 | HR 84 | Ht 67.0 in | Wt 200.1 lb

## 2017-12-18 DIAGNOSIS — R195 Other fecal abnormalities: Secondary | ICD-10-CM | POA: Diagnosis not present

## 2017-12-18 DIAGNOSIS — K219 Gastro-esophageal reflux disease without esophagitis: Secondary | ICD-10-CM | POA: Diagnosis not present

## 2017-12-18 NOTE — Progress Notes (Signed)
HPI: This is a very pleasant 62 year old man who was referred to me by Manfred Shirts, PA  to evaluate bloating, gassiness, intermittent diarrhea.    Chief complaint is bloating, gassiness, intermittent diarrhea  He was seen in the emergency room early June 2019 for nausea, diarrhea.  It looks like after his stool studies proved E. coli infection he was put on a prescription of Cipro  07/2017 EF 33% to 45% on a variety of cardiac tests;  Follow up Echo 11/2017 EF was 40-45%  Travels a lot to [place].  He thinks he caught giardia at one point but this was never proven.  Can recall drinking local water in [place] after hiking. Thinks he caught giardia 4 months prior to his ER visit in 09/2017.  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 [place].  He has BM 1-2 times per day lately.  Can be loose at times. Has bloating at times, gassiness.  He has chronic nighttime GERD and pyrosis.  He has seen a gastroenterologist and [place] for this and he was given some type of a pill, he cannot recall the name but it was small and white.  It really helped his GERD symptoms.  He has no dysphasia.  His weight is overall stable.  He's had two colonoscopies, both done and [place] by a gastroenterologist there.  His most recent was 2 or 3 years ago.  The one prior to that was a year or 2 prior.  He thinks he has had polyps on 1 of these.  We do not have any of those results at the time of this visit.   Old Data Reviewed: Labs August 0998; basic metabolic profile was normal except for slightly elevated blood sugar Stool testing June 2016 was negative for C. difficile but positive for 2 strains of E. Coli (EAEC and EPEC)     Review of systems: Pertinent positive and negative review of systems were noted in the above HPI section. All other review negative.   Past Medical History:  Diagnosis Date  . Arthritis   . CAD (coronary artery disease)    a. CABG x4 with LIMA to dLAD, free RIMA to OM1, SVG to D1, SVG to OM2 in  2006. b. PCI 2008 with notable ventricular fibrillation during cath) - DES to proxLAD/1stdiagonal. c. Cath 07/25/17 demonstrated previously placed stents, patent LIMA-LAD, patent free RIMA-OM, occluded VG-diagonal and occluded Y graft-OM2, without significant change from prior, elevated LVEDP.  . Cardiomyopathy ([place])    EF prev normal 2004, 2008 // EF 35% by cath in 07/2017 // Echo 8/19:  Mild conc LVH, EF 40-45, diff HK, Gr 1 DD, mild AI, mild MR, mod LAE, normal RVSF, mild RAE, mild TR  . Chronic systolic CHF (congestive heart failure) ([place])   . Erectile dysfunction   . GERD (gastroesophageal reflux disease)   . Hyperlipidemia   . Hypertension   . Sleep apnea    does not wear mask  . Tubular adenoma     Past Surgical History:  Procedure Laterality Date  . 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 CORONARY ARTERY BYPASS GRAFT  2008  . EYE SURGERY Bilateral    Lasik  . KNEE ARTHROPLASTY Left   . LEFT HEART CATH AND CORS/GRAFTS ANGIOGRAPHY N/A 07/25/2017   Procedure: LEFT HEART CATH AND CORS/GRAFTS ANGIOGRAPHY;  Surgeon: Lorretta Harp, MD;  Location: [facility];  Service:  Cardiovascular;  Laterality: N/A;  . SHOULDER SURGERY Right   . TOTAL KNEE ARTHROPLASTY Right 08/25/2014  . TOTAL KNEE ARTHROPLASTY Right 08/25/2014   Procedure: RIGHT TOTAL KNEE ARTHROPLASTY;  Surgeon: Kathryne Hitch, MD;  Location: [facility];  Service: Orthopedics;  Laterality: Right;    Current Outpatient Medications  Medication Sig Dispense Refill  . aspirin EC 81 MG tablet Take 1 tablet (81 mg total) by mouth daily. 90 tablet 3  . Coenzyme Q10 (CO Q-10) 300 MG CAPS Take 1 capsule by mouth daily.  0  . Cyanocobalamin (B-12 PO) Take 1 tablet by mouth daily.     . furosemide (LASIX) 20 MG tablet Take 1 tablet (20 mg total) by mouth as needed (Sweeling , weight gain or shortness of breath). 30 tablet 4  . GARLIC PO Take 1 tablet by mouth daily.    Marland Kitchen MAGNESIUM PO  Take 500 mg by mouth daily.     . metoprolol succinate (TOPROL-XL) 25 MG 24 hr tablet Take 1 tablet (25 mg total) by mouth daily. 90 tablet 3  . Multiple Vitamins-Minerals (MULTIVITAMIN PO) Take 1 tablet by mouth daily.     . nitroGLYCERIN (NITROSTAT) 0.4 MG SL tablet PLACE 1 TABLET UNDER THE TONGUE EVERY 5 (FIVE) MINUTES X 3 DOSES AS NEEDED FOR CHEST PAIN. 25 tablet 3  . rosuvastatin (CRESTOR) 40 MG tablet Take 1 tablet (40 mg total) by mouth daily. 90 tablet 3  . sacubitril-valsartan (ENTRESTO) 97-103 MG Take 1 tablet by mouth 2 (two) times daily. 180 tablet 3  . TURMERIC PO Take 500 mg by mouth daily.     . vitamin C (ASCORBIC ACID) 500 MG tablet Take 500 mg by mouth daily.    . Vitamin D, Ergocalciferol, (DRISDOL) 50000 units CAPS capsule TAKE 1 CAPSULE ONCE PER WEEK  1  . VITAMIN E PO Take 400 mg by mouth daily.      No current facility-administered medications for this visit.     Allergies as of 12/18/2017 - Review Complete 12/18/2017  Allergen Reaction Noted  . Penicillins Itching and Rash 08/22/2013    Family History  Problem Relation Age of Onset  . Heart failure Father   . Hypertension Mother     Social History   Socioeconomic History  . Marital status: Married    Spouse name: Not on file  . Number of children: Not on file  . Years of education: Not on file  . Highest education level: Not on file  Occupational History  . Not on file  Social Needs  . Financial resource strain: Not on file  . Food insecurity:    Worry: Not on file    Inability: Not on file  . Transportation needs:    Medical: Not on file    Non-medical: Not on file  Tobacco Use  . Smoking status: Never Smoker  . Smokeless tobacco: Never Used  Substance and Sexual Activity  . Alcohol use: No  . Drug use: No  . Sexual activity: Yes    Partners: Female  Lifestyle  . Physical activity:    Days per week: Not on file    Minutes per session: Not on file  . Stress: Not on file  Relationships   . Social connections:    Talks on phone: Not on file    Gets together: Not on file    Attends religious service: Not on file    Active member of club or organization: Not on file    Attends  meetings of clubs or organizations: Not on file    Relationship status: Not on file  . Intimate partner violence:    Fear of current or ex partner: Not on file    Emotionally abused: Not on file    Physically abused: Not on file    Forced sexual activity: Not on file  Other Topics Concern  . Not on file  Social History Narrative  . Not on file     Physical Exam: BP 122/78   Pulse 84   Ht 5\' 7"  (1.702 m)   Wt 200 lb 2 oz (90.8 kg)   BMI 31.34 kg/m  Constitutional: generally well-appearing Psychiatric: alert and oriented x3 Eyes: extraocular movements intact Mouth: oral pharynx moist, no lesions Neck: supple no lymphadenopathy Cardiovascular: heart regular rate and rhythm Lungs: clear to auscultation bilaterally Abdomen: soft, nontender, nondistended, no obvious ascites, no peritoneal signs, normal bowel sounds Extremities: no lower extremity edema bilaterally Skin: no lesions on visible extremities   Assessment and plan: 62 y.o. male with chronic GERD, intermittent bloating, gassiness, intermittent loose stools  First he certainly seems to have nighttime acid reflux issues.  I recommended a trial of ranitidine 150 mg pills 1 pill at bedtime every night.  He understands some lifestyle modification such as cutting back on late meals and late alcohol intake would also help.  He travels a lot out of the country.  He thinks he has had Giardia in the past but this is never been proven.  He certainly had E. coli in his stool when he returned from [place] 2 or 3 months ago.  Going to have good basic GI pathogen panel today.  If this is completely negative I still may want to treat with a weeklong course of Flagyl given his symptoms and significant travel to [place].  He has had  polyps in the past.  He has a gastroenterologist at [place] he is seen as recently as 2 or 3 years ago we will get all those records sent here for review and I am happy to take over his polyp surveillance, colon cancer screening.   Please see the "Patient Instructions" section for addition details about the plan.   Owens Loffler, MD [facility] 12/18/2017, 10:54 AM  Cc: Manfred Shirts, [place]

## 2017-12-18 NOTE — Patient Instructions (Addendum)
We will get records sent from your previous gastroenterologist ([place] colonoscopies) for review.  This will include any endoscopic (colonoscopy or upper endoscopy) procedures and any associated pathology reports.    Stool for GI pathogen panel for intermittent loose stools, diarrhea.  May treat with flagyl even if the above is normal.  Ranitidine 150mg  pill (OTC) one pill at bedtime every night  Normal BMI (Body Mass Index- based on height and weight) is between 19 and 25. Your BMI today is Body mass index is 31.34 kg/m. Marland Kitchen Please consider follow up  regarding your BMI with your Primary Care Provider. Marland Kitchen

## 2017-12-23 ENCOUNTER — Telehealth: Payer: Self-pay | Admitting: Gastroenterology

## 2017-12-23 NOTE — Telephone Encounter (Signed)
   Colonoscopy report received dated November 2016, Dr. Shana Chute.  Indication "personal history of colon polyps".  Findings 2 sessile 3 to 4 mm polyps were removed from the sigmoid.  Diverticulosis was also noted.  1 of these polyps was a tubular adenoma and the other was hyperplastic.  It looks like he was recommended to have a repeat colonoscopy in 3 years "after a 2-day prep" the report reads that the "preparation was fair"  Can you please call him.  Remind him about dropping off the stool samples for his GI pathogen panel.  Also please explained to him that his previous gastroenterologist documented that his prep was not ideal and that he should undergo a colonoscopy sooner than is usually recommended after a "2-day prep." I cannot argue with those recommendations however it is generally my practice that if the prep is less than adequate I would repeat a colonoscopy quite a bit sooner than 3 years later.    Because of this I recommend a colonoscopy now.

## 2017-12-23 NOTE — Telephone Encounter (Signed)
Left message on machine to call back  

## 2017-12-24 ENCOUNTER — Other Ambulatory Visit: Payer: BLUE CROSS/BLUE SHIELD

## 2017-12-24 DIAGNOSIS — R195 Other fecal abnormalities: Secondary | ICD-10-CM

## 2017-12-24 NOTE — Telephone Encounter (Signed)
Left message on machine to call back  

## 2017-12-24 NOTE — Telephone Encounter (Signed)
The pt was notified and will call back to set up appt for colon.  He was advised to turn in stool test which he will do today.  The colon will need to be a 2 day prep.

## 2017-12-26 LAB — GASTROINTESTINAL PATHOGEN PANEL PCR
C. DIFFICILE TOX A/B, PCR: NOT DETECTED
CRYPTOSPORIDIUM, PCR: NOT DETECTED
Campylobacter, PCR: NOT DETECTED
E COLI (ETEC) LT/ST, PCR: NOT DETECTED
E coli (STEC) stx1/stx2, PCR: NOT DETECTED
E coli 0157, PCR: NOT DETECTED
GIARDIA LAMBLIA, PCR: NOT DETECTED
Norovirus, PCR: NOT DETECTED
Rotavirus A, PCR: NOT DETECTED
Salmonella, PCR: NOT DETECTED
Shigella, PCR: NOT DETECTED

## 2018-01-30 ENCOUNTER — Ambulatory Visit: Payer: BLUE CROSS/BLUE SHIELD | Admitting: Interventional Cardiology

## 2018-02-10 ENCOUNTER — Encounter: Payer: BLUE CROSS/BLUE SHIELD | Admitting: Gastroenterology

## 2018-03-03 ENCOUNTER — Ambulatory Visit: Payer: BLUE CROSS/BLUE SHIELD | Admitting: Nurse Practitioner

## 2018-03-03 ENCOUNTER — Encounter: Payer: Self-pay | Admitting: Nurse Practitioner

## 2018-03-03 VITALS — BP 112/62 | HR 81 | Ht 67.0 in | Wt 205.1 lb

## 2018-03-03 DIAGNOSIS — I10 Essential (primary) hypertension: Secondary | ICD-10-CM | POA: Diagnosis not present

## 2018-03-03 DIAGNOSIS — I2510 Atherosclerotic heart disease of native coronary artery without angina pectoris: Secondary | ICD-10-CM

## 2018-03-03 DIAGNOSIS — I5022 Chronic systolic (congestive) heart failure: Secondary | ICD-10-CM | POA: Diagnosis not present

## 2018-03-03 MED ORDER — FUROSEMIDE 20 MG PO TABS: 20.0000 mg | ORAL_TABLET | ORAL | 6 refills | Status: DC | PRN

## 2018-03-03 MED ORDER — SACUBITRIL-VALSARTAN 97-103 MG PO TABS: 1.0000 | ORAL_TABLET | Freq: Two times a day (BID) | ORAL | 3 refills | Status: DC

## 2018-03-03 MED ORDER — ROSUVASTATIN CALCIUM 40 MG PO TABS: 20.0000 mg | ORAL_TABLET | Freq: Every day | ORAL | 3 refills | Status: DC

## 2018-03-03 NOTE — Patient Instructions (Addendum)
We will be checking the following labs today - BMET & BNP    Medication Instructions:    Continue with your current medicines. BUT  I am cutting the Crestor in half.   I refilled the Entresto and Lasix today.    If you need a refill on your cardiac medications before your next appointment, please call your pharmacy.     Testing/Procedures To Be Arranged:  N/A  Follow-Up:   See Dr. Tamala Julian in about 2 to 3 months.     At [facility], you and your health needs are our priority.  As part of our continuing mission to provide you with exceptional heart care, we have created designated Provider Care Teams.  These Care Teams include your primary Cardiologist (physician) and Advanced Practice Providers (APPs -  Physician Assistants and Nurse Practitioners) who all work together to provide you with the care you need, when you need it.  Special Instructions:  . Need to restrict your salt.  Marland Kitchen STOP the Ibuprofen  Call the [place] office at [phone] if you have any questions, problems or concerns.

## 2018-03-03 NOTE — Progress Notes (Signed)
CARDIOLOGY OFFICE NOTE  Date:  03/03/2018    Scott Roth Date of Birth: 1956/04/11 Medical Record #[id number]  PCP:  Manfred Shirts, PA  Cardiologist:  Tamala Julian   Chief Complaint  Patient presents with  . Congestive Heart Failure    Follow up visit - seen for Dr. Tamala Julian    History of Present Illness: Scott Roth is a 62 y.o. male who presents today for a 4 month check. Seen for Dr. Tamala Julian.   He has a history of known CAD, s/p CABG in 2006 and subsequent PCI with DES to the LAD/D1 in 9030 (cath complicated by VF), systolic CHF, hypertension, hyperlipidemia & OSA not on CPAP.  Nuclear stress test in 4/19, done for chest pain, demonstrated newly depressed LVF and cardiac catheterization was recommended in 4/19 which demonstrated patent L-LAD, patent RIMA-OM1, occluded S-Dx and an occluded S-OM2. His stents to the LAD and Dx were patent.  Medical Rx was continued.    Last seen by Dr. Tamala Julian during his hospitalization back in April of 2019. Last seen by Richardson Dopp, PA in July - lots of questions regarding his medicines.   Comes in today. Here with his girlfriend - Scott Roth. He seems confused about his history - says he has seen my husband recently - I do not see this. No more chest pain reported. Lots of body aches. Wondering if it is the Catarina - and then wonders if maybe it is the Crestor. Taking high doses of Ibuprofen. Says he feels "beat up". He was taking on Vitamin D 50,000 IU - unclear what he is to be taking. Does not have an upcoming appointment. He has moved to [place] - considering changing PCP. He is not fasting today - went to eat Chinese right before he came here. He says his heart is "ok" but then tells me he is dizzy with moving around - sounds more like with bending over. He is not exercising. Says he went to the beach - "was bad" - felt bad - does not really elaborate.   Past Medical History:  Diagnosis Date  . Arthritis   . CAD (coronary artery disease)      a. CABG x4 with LIMA to dLAD, free RIMA to OM1, SVG to D1, SVG to OM2 in 2006. b. PCI 2008 with notable ventricular fibrillation during cath) - DES to proxLAD/1stdiagonal. c. Cath 07/25/17 demonstrated previously placed stents, patent LIMA-LAD, patent free RIMA-OM, occluded VG-diagonal and occluded Y graft-OM2, without significant change from prior, elevated LVEDP.  . Cardiomyopathy ([place])    EF prev normal 2004, 2008 // EF 35% by cath in 07/2017 // Echo 8/19:  Mild conc LVH, EF 40-45, diff HK, Gr 1 DD, mild AI, mild MR, mod LAE, normal RVSF, mild RAE, mild TR  . Chronic systolic CHF (congestive heart failure) ([place])   . Erectile dysfunction   . GERD (gastroesophageal reflux disease)   . Hyperlipidemia   . Hypertension   . Sleep apnea    does not wear mask  . Tubular adenoma     Past Surgical History:  Procedure Laterality Date  . 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 CORONARY ARTERY BYPASS GRAFT  2008  . EYE SURGERY Bilateral    Lasik  . KNEE ARTHROPLASTY Left   . LEFT HEART CATH AND CORS/GRAFTS ANGIOGRAPHY N/A 07/25/2017   Procedure: LEFT HEART CATH AND CORS/GRAFTS ANGIOGRAPHY;  Surgeon:  Lorretta Harp, MD;  Location: [facility];  Service: Cardiovascular;  Laterality: N/A;  . SHOULDER SURGERY Right   . TOTAL KNEE ARTHROPLASTY Right 08/25/2014  . TOTAL KNEE ARTHROPLASTY Right 08/25/2014   Procedure: RIGHT TOTAL KNEE ARTHROPLASTY;  Surgeon: Kathryne Hitch, MD;  Location: [facility];  Service: Orthopedics;  Laterality: Right;     Medications: Current Meds  Medication Sig  . aspirin EC 81 MG tablet Take 1 tablet (81 mg total) by mouth daily.  . Coenzyme Q10 (CO Q-10) 300 MG CAPS Take 1 capsule by mouth daily.  . Cyanocobalamin (B-12 PO) Take 1 tablet by mouth daily.   . furosemide (LASIX) 20 MG tablet Take 1 tablet (20 mg total) by mouth as needed (Sweeling , weight gain or shortness of breath).  . GARLIC PO Take 1 tablet by  mouth daily.  Marland Kitchen MAGNESIUM PO Take 500 mg by mouth daily.   . metoprolol succinate (TOPROL-XL) 25 MG 24 hr tablet Take 1 tablet (25 mg total) by mouth daily.  . Multiple Vitamins-Minerals (MULTIVITAMIN PO) Take 1 tablet by mouth daily.   . nitroGLYCERIN (NITROSTAT) 0.4 MG SL tablet PLACE 1 TABLET UNDER THE TONGUE EVERY 5 (FIVE) MINUTES X 3 DOSES AS NEEDED FOR CHEST PAIN.  . rosuvastatin (CRESTOR) 40 MG tablet Take 0.5 tablets (20 mg total) by mouth daily.  . sacubitril-valsartan (ENTRESTO) 97-103 MG Take 1 tablet by mouth 2 (two) times daily.  . TURMERIC PO Take 500 mg by mouth daily.   . vitamin C (ASCORBIC ACID) 500 MG tablet Take 500 mg by mouth daily.  Marland Kitchen VITAMIN E PO Take 400 mg by mouth daily.   . [DISCONTINUED] furosemide (LASIX) 20 MG tablet Take 1 tablet (20 mg total) by mouth as needed (Sweeling , weight gain or shortness of breath).  . [DISCONTINUED] rosuvastatin (CRESTOR) 40 MG tablet Take 1 tablet (40 mg total) by mouth daily.  . [DISCONTINUED] sacubitril-valsartan (ENTRESTO) 97-103 MG Take 1 tablet by mouth 2 (two) times daily.  . [DISCONTINUED] sacubitril-valsartan (ENTRESTO) 97-103 MG Take 1 tablet by mouth 2 (two) times daily.     Allergies: Allergies  Allergen Reactions  . Penicillins Itching and Rash    Other reaction(s): Other (See Comments) Has patient had a PCN reaction causing immediate rash, facial/tongue/throat swelling, SOB or lightheadedness with hypotension: No Has patient had a PCN reaction causing severe rash involving mucus membranes or skin necrosis: No Has patient had a PCN reaction that required hospitalization: No Has patient had a PCN reaction occurring within the last 10 years: No If all of the above answers are "NO", then may proceed with Cephalosporin use.    Social History: The patient  reports that he has never smoked. He has never used smokeless tobacco. He reports that he does not drink alcohol or use drugs.   Family History: The patient's  family history includes Heart failure in his father; Hypertension in his mother.   Review of Systems: Please see the history of present illness.   Otherwise, the review of systems is positive for none.   All other systems are reviewed and negative.   Physical Exam: VS:  BP 112/62 (BP Location: Left Arm, Patient Position: Sitting, Cuff Size: Normal)   Pulse 81   Ht 5\' 7"  (1.702 m)   Wt 205 lb 1.9 oz (93 kg)   SpO2 95% Comment: at rest  BMI 32.13 kg/m  .  BMI Body mass index is 32.13 kg/m.  Wt Readings from Last 3  Encounters:  03/03/18 205 lb 1.9 oz (93 kg)  12/18/17 200 lb 2 oz (90.8 kg)  10/29/17 200 lb 3.2 oz (90.8 kg)    General: Alert. He is in no acute distress.   HEENT: Normal.  Neck: Supple, no JVD, carotid bruits, or masses noted.  Cardiac: Regular rate and rhythm. No murmurs, rubs, or gallops. No edema.  Respiratory:  Lungs are clear to auscultation bilaterally with normal work of breathing.  GI: Soft and nontender.  MS: No deformity or atrophy. Gait and ROM intact.  Skin: Warm and dry. Color is normal.  Neuro:  Strength and sensation are intact and no gross focal deficits noted.  Psych: Alert, appropriate and with normal affect.   LABORATORY DATA:  EKG:  EKG is not ordered today.  Lab Results  Component Value Date   WBC 10.0 09/18/2017   HGB 16.2 09/18/2017   HCT 46.0 09/18/2017   PLT 287 09/18/2017   GLUCOSE 115 (H) 12/09/2017   CHOL 98 07/24/2017   TRIG 107 07/24/2017   HDL 37 (L) 07/24/2017   LDLCALC 40 07/24/2017   ALT 21 09/18/2017   AST 20 09/18/2017   NA 140 12/09/2017   K 4.6 12/09/2017   CL 103 12/09/2017   CREATININE 1.07 12/09/2017   BUN 13 12/09/2017   CO2 22 12/09/2017   PSA 0.36 08/31/2013   INR 0.95 07/24/2017   HGBA1C 6.3 08/31/2013     BNP (last 3 results) No results for input(s): BNP in the last 8760 hours.  ProBNP (last 3 results) Recent Labs    09/11/17 1133  PROBNP 138     Other Studies Reviewed Today:  Echo  Study Conclusions 11/2017  - Left ventricle: The cavity size was normal. There was mild   concentric hypertrophy. Systolic function was mildly to   moderately reduced. The estimated ejection fraction was in the   range of 40% to 45%. Diffuse hypokinesis. Doppler parameters are   consistent with abnormal left ventricular relaxation (grade 1   diastolic dysfunction). There was no evidence of elevated   ventricular filling pressure by Doppler parameters. - Aortic valve: There was mild regurgitation. - Mitral valve: There was mild regurgitation. - Left atrium: The atrium was moderately dilated. - Right ventricle: Systolic function was normal. - Right atrium: The atrium was mildly dilated. - Tricuspid valve: There was mild regurgitation. - Pulmonary arteries: Systolic pressure was within the normal   range. - Inferior vena cava: The vessel was normal in size.  ------------------------------------------------------------------- Notes recorded by Liliane Shi, PA-C on 12/09/2017 at 8:57 AM EDT This study demonstrates: EF 40-45%, mildly impaired relaxation (diastolic dysfunction) and no significant valvular abnormalities.  The EF on this study is better than it was on the nuclear study that prompted his Cardiac Catheterization. Medication changes / Follow up studies / Other recommendations:  - Continue current medications; follow up as planned.  Please send results to the PCP: Manfred Shirts, PA  Richardson Dopp, PA-C 12/09/2017 8:54 AM     LEFT HEART CATH AND CORS/GRAFTS ANGIOGRAPHY 07/2017  Conclusion     Prox Cx lesion is 100% stenosed.  Ost LAD to Prox LAD lesion is 50% stenosed.  Previously placed Ost 1st Diag stent (unknown type) is widely patent.  Previously placed Prox LAD stent (unknown type) is widely patent.  Prox LAD to Mid LAD lesion is 95% stenosed.  Origin to Prox Graft lesion is 100% stenosed.  There is moderate to severe left ventricular systolic  dysfunction.  LV end diastolic pressure is moderately elevated.  The left ventricular ejection fraction is 35-45% by visual estimate.   Scott Roth is a 62 y.o. male      Assessment/Plan:  1. CAD - with prior CABG 2006 with PCI/DES to native LAD/DX in 2008. He has had prior cath from 07/2017 due to chest pain and worsening EF - he has 2/4 grafts patent and LAD stent is patent - he is managed medically. He seems to have poor insight into his health. Tells me at the end of the visit that he will "come back lighter and improved/new and improved".   2. Chronic systolic HF - last EF 40 to 45% - did not require EP consult for ICD - needs to work on salt restriction. Taking high dose NSAID as well - advised to stop. Checking lab today - Lasix refilled - I suspect he will need some extra diuretic therapy over the next few days.   3. HTN - BP is ok.   4. HLD - on statin - cutting in half due to his complaints of aching. Recheck fasting labs on return.   5. Vit D deficiency - I advised him to see his PCP to discuss further dosing.   Current medicines are reviewed with the patient today.  The patient does not have concerns regarding medicines other than what has been noted above.  The following changes have been made:  See above.  Labs/ tests ordered today include:    Orders Placed This Encounter  Procedures  . Basic metabolic panel  . Pro b natriuretic peptide (BNP)     Disposition:   FU with Dr. Tamala Julian in a couple of months.   Patient is agreeable to this plan and will call if any problems develop in the interim.   SignedTruitt Merle, NP  03/03/2018 3:23 PM  [facility] [address] [place], [place]  [address] Phone: [phone] Fax: [phone]

## 2018-03-04 LAB — BASIC METABOLIC PANEL
BUN/Creatinine Ratio: 17 (ref 10–24)
BUN: 17 mg/dL (ref 8–27)
CO2: 22 mmol/L (ref 20–29)
Calcium: 9.7 mg/dL (ref 8.6–10.2)
Chloride: 101 mmol/L (ref 96–106)
Creatinine, Ser: 1.01 mg/dL (ref 0.76–1.27)
GFR calc Af Amer: 92 mL/min/{1.73_m2} (ref >59)
GFR calc non Af Amer: 79 mL/min/{1.73_m2} (ref >59)
Glucose: 159 mg/dL — ABNORMAL HIGH (ref 65–99)
Potassium: 5.2 mmol/L (ref 3.5–5.2)
Sodium: 140 mmol/L (ref 134–144)

## 2018-03-04 LAB — PRO B NATRIURETIC PEPTIDE: NT-Pro BNP: 43 pg/mL (ref 0–210)

## 2018-03-20 DIAGNOSIS — R062 Wheezing: Secondary | ICD-10-CM | POA: Diagnosis not present

## 2018-03-20 DIAGNOSIS — K219 Gastro-esophageal reflux disease without esophagitis: Secondary | ICD-10-CM | POA: Diagnosis not present

## 2018-03-20 DIAGNOSIS — R042 Hemoptysis: Secondary | ICD-10-CM | POA: Diagnosis not present

## 2018-03-20 DIAGNOSIS — R05 Cough: Secondary | ICD-10-CM | POA: Diagnosis not present

## 2018-03-20 DIAGNOSIS — Z951 Presence of aortocoronary bypass graft: Secondary | ICD-10-CM | POA: Diagnosis not present

## 2018-03-20 DIAGNOSIS — J209 Acute bronchitis, unspecified: Secondary | ICD-10-CM | POA: Diagnosis not present

## 2018-04-03 ENCOUNTER — Encounter: Payer: Self-pay | Admitting: Gastroenterology

## 2018-04-29 NOTE — Progress Notes (Signed)
Cardiology Office Note:    Date:  04/30/2018   ID:  Scott Roth, DOB [birth year], MRN [id number]  PCP:  Manfred Shirts, PA  Cardiologist:  Sinclair Grooms, MD   Referring MD: Manfred Shirts, [place]   Chief Complaint  Patient presents with  . Coronary Artery Disease  . Congestive Heart Failure  . Advice Only    Obstructive sleep apnea    History of Present Illness:    Scott Roth is a 63 y.o. male with a hx of CAD, s/p CABG in 2006 and subsequent PCI with DES to the LAD/D1 in 5427 (cath complicated by VF), systolic CHF,hypertension,hyperlipidemia & OSA not on CPAP.Nuclear stress testin 4/19, done forchest pain, demonstrated newly depressed LVF andcardiac catheterizationwas recommended in 4/19 which demonstrated patent L-LAD, patent RIMA-OM1, occluded S-Dx and an occluded S-OM2. His stents to the LAD and Dx were patent. Medical Rx was continued.   Has occasional mild wheezing.  He denies angina.  He is compliant with medical therapy.  No significant orthopnea.  States that he notices wheezing if he has a couple beers or takes in too much fluid.  No lower extremity edema.  He has not needed to take any nitroglycerin sublingually.  Denies palpitations and has not had syncope.  Past Medical History:  Diagnosis Date  . Arthritis   . CAD (coronary artery disease)    a. CABG x4 with LIMA to dLAD, free RIMA to OM1, SVG to D1, SVG to OM2 in 2006. b. PCI 2008 with notable ventricular fibrillation during cath) - DES to proxLAD/1stdiagonal. c. Cath 07/25/17 demonstrated previously placed stents, patent LIMA-LAD, patent free RIMA-OM, occluded VG-diagonal and occluded Y graft-OM2, without significant change from prior, elevated LVEDP.  . Cardiomyopathy ([place])    EF prev normal 2004, 2008 // EF 35% by cath in 07/2017 // Echo 8/19:  Mild conc LVH, EF 40-45, diff HK, Gr 1 DD, mild AI, mild MR, mod LAE, normal RVSF, mild RAE, mild TR  . Chronic systolic CHF (congestive heart failure)  ([place])   . Erectile dysfunction   . GERD (gastroesophageal reflux disease)   . Hyperlipidemia   . Hypertension   . Sleep apnea    does not wear mask  . Tubular adenoma     Past Surgical History:  Procedure Laterality Date  . 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 CORONARY ARTERY BYPASS GRAFT  2008  . EYE SURGERY Bilateral    Lasik  . KNEE ARTHROPLASTY Left   . LEFT HEART CATH AND CORS/GRAFTS ANGIOGRAPHY N/A 07/25/2017   Procedure: LEFT HEART CATH AND CORS/GRAFTS ANGIOGRAPHY;  Surgeon: Lorretta Harp, MD;  Location: [facility];  Service: Cardiovascular;  Laterality: N/A;  . SHOULDER SURGERY Right   . TOTAL KNEE ARTHROPLASTY Right 08/25/2014  . TOTAL KNEE ARTHROPLASTY Right 08/25/2014   Procedure: RIGHT TOTAL KNEE ARTHROPLASTY;  Surgeon: Kathryne Hitch, MD;  Location: [facility];  Service: Orthopedics;  Laterality: Right;    Current Medications: Current Meds  Medication Sig  . aspirin EC 81 MG tablet Take 1 tablet (81 mg total) by mouth daily.  . Coenzyme Q10 (CO Q-10) 300 MG CAPS Take 1 capsule by mouth daily.  . Cyanocobalamin (B-12 PO) Take 1 tablet by mouth daily.   . furosemide (LASIX) 20 MG tablet Take 1 tablet (20 mg total) by mouth as needed ([place] , weight gain or shortness of breath).  Marland Kitchen  GARLIC PO Take 1 tablet by mouth daily.  Marland Kitchen MAGNESIUM PO Take 500 mg by mouth daily.   . metoprolol succinate (TOPROL-XL) 25 MG 24 hr tablet Take 1 tablet (25 mg total) by mouth daily.  . Multiple Vitamins-Minerals (MULTIVITAMIN PO) Take 1 tablet by mouth daily.   . nitroGLYCERIN (NITROSTAT) 0.4 MG SL tablet PLACE 1 TABLET UNDER THE TONGUE EVERY 5 (FIVE) MINUTES X 3 DOSES AS NEEDED FOR CHEST PAIN.  . rosuvastatin (CRESTOR) 40 MG tablet Take 0.5 tablets (20 mg total) by mouth daily.  . sacubitril-valsartan (ENTRESTO) 97-103 MG Take 1 tablet by mouth 2 (two) times daily.  . TURMERIC PO Take 500 mg by mouth daily.   . vitamin C  (ASCORBIC ACID) 500 MG tablet Take 500 mg by mouth daily.  Marland Kitchen VITAMIN E PO Take 400 mg by mouth daily.      Allergies:   Penicillins   Social History   Socioeconomic History  . Marital status: Married    Spouse name: Not on file  . Number of children: Not on file  . Years of education: Not on file  . Highest education level: Not on file  Occupational History  . Not on file  Social Needs  . Financial resource strain: Not on file  . Food insecurity:    Worry: Not on file    Inability: Not on file  . Transportation needs:    Medical: Not on file    Non-medical: Not on file  Tobacco Use  . Smoking status: Never Smoker  . Smokeless tobacco: Never Used  Substance and Sexual Activity  . Alcohol use: No  . Drug use: No  . Sexual activity: Yes    Partners: Female  Lifestyle  . Physical activity:    Days per week: Not on file    Minutes per session: Not on file  . Stress: Not on file  Relationships  . Social connections:    Talks on phone: Not on file    Gets together: Not on file    Attends religious service: Not on file    Active member of club or organization: Not on file    Attends meetings of clubs or organizations: Not on file    Relationship status: Not on file  Other Topics Concern  . Not on file  Social History Narrative  . Not on file     Family History: The patient's family history includes Heart failure in his father; Hypertension in his mother.  ROS:   Please see the history of present illness.    Decreased hearing occasionally irregular heartbeat, snoring, wheezing, headache, dizziness, back pain, muscle pain, and diarrhea.  Leave he has sleep apnea.  He has never been able to tolerate having a sleep study done because he has claustrophobia and fear when all of the electrodes are placed.  All other systems reviewed and are negative.  EKGs/Labs/Other Studies Reviewed:    The following studies were reviewed today: 2D Doppler echocardiogram December 06, 2017: Study Conclusions  - Left ventricle: The cavity size was normal. There was mild   concentric hypertrophy. Systolic function was mildly to   moderately reduced. The estimated ejection fraction was in the   range of 40% to 45%. Diffuse hypokinesis. Doppler parameters are   consistent with abnormal left ventricular relaxation (grade 1   diastolic dysfunction). There was no evidence of elevated   ventricular filling pressure by Doppler parameters. - Aortic valve: There was mild regurgitation. - Mitral valve:  There was mild regurgitation. - Left atrium: The atrium was moderately dilated. - Right ventricle: Systolic function was normal. - Right atrium: The atrium was mildly dilated. - Tricuspid valve: There was mild regurgitation. - Pulmonary arteries: Systolic pressure was within the normal   range. - Inferior vena cava: The vessel was normal in size.   Coronary angiography April 2019: Diagnostic  Dominance: Right    LVEF by ventriculography 35%    EKG:  EKG is not done.  Recent Labs: 09/18/2017: ALT 21; Hemoglobin 16.2; Platelets 287 03/03/2018: BUN 17; Creatinine, Ser 1.01; NT-Pro BNP 43; Potassium 5.2; Sodium 140  Recent Lipid Panel    Component Value Date/Time   CHOL 98 07/24/2017 1924   CHOL 157 07/15/2017 0740   TRIG 107 07/24/2017 1924   HDL 37 (L) 07/24/2017 1924   HDL 46 07/15/2017 0740   CHOLHDL 2.6 07/24/2017 1924   VLDL 21 07/24/2017 1924   LDLCALC 40 07/24/2017 1924   LDLCALC 85 07/15/2017 0740    Physical Exam:    VS:  BP 114/82   Pulse 77   Ht 5\' 7"  (1.702 m)   Wt 205 lb 3.2 oz (93.1 kg)   SpO2 97%   BMI 32.14 kg/m     Wt Readings from Last 3 Encounters:  04/30/18 205 lb 3.2 oz (93.1 kg)  03/03/18 205 lb 1.9 oz (93 kg)  12/18/17 200 lb 2 oz (90.8 kg)     GEN: Moderately obese. No acute distress HEENT: Normal NECK: No JVD. LYMPHATICS: No lymphadenopathy CARDIAC: RRR.  No murmur, gallop, edema VASCULAR: Pulses 2+ bilateral radial and  carotid., Bruits are absent in the carotids. RESPIRATORY:  Clear to auscultation without rales, wheezing or rhonchi  ABDOMEN: Soft, non-tender, non-distended, No pulsatile mass, MUSCULOSKELETAL: No deformity  SKIN: Warm and dry NEUROLOGIC:  Alert and oriented x 3 PSYCHIATRIC:  Normal affect   ASSESSMENT:    1. Coronary artery disease involving coronary bypass graft of native heart without angina pectoris   2. Chronic systolic CHF (congestive heart failure) ([place])   3. Essential hypertension   4. Hyperlipidemia, unspecified hyperlipidemia type   5. Sleep apnea, unspecified type    PLAN:    In order of problems listed above:  1. Stable without angina.  See the Angie results above. 2. LVEF increased from 35% to 45% with guideline directed therapy.  Currently we are not using a mineralocorticoid receptor antagonist.  Last potassium was 5.2.  May consider adding low-dose Aldactone based on this upcoming laboratory data.  He uses Lasix as needed. 3. Target blood pressure 130/80.  Achieved on current medical regimen. 4. LDL target is less than 70.  Lipid panel will be done in April. 5. Sleep apnea, never completed CPAP titration.  Will repeat evaluation to get started on therapy for OSA.  Clinical follow-up with me in 6 months.  In April we will check a lipid panel, BNP, hemoglobin A1c, and electrolytes.  This data will help determine if we should add a MRA.  We will again attempt to treat sleep apnea which is a powerful risk factor in him.   Medication Adjustments/Labs and Tests Ordered: Current medicines are reviewed at length with the patient today.  Concerns regarding medicines are outlined above.  Orders Placed This Encounter  Procedures  . Hepatic function panel  . Lipid panel  . Pro b natriuretic peptide (BNP)  . Basic metabolic panel   No orders of the defined types were placed in this encounter.  Patient Instructions  Medication Instructions:  Your physician recommends  that you continue on your current medications as directed. Please refer to the Current Medication list given to you today.  If you need a refill on your cardiac medications before your next appointment, please call your pharmacy.   Lab work: Your physician recommends that you return for a FASTING lipid profile, liver function panel, basic metabolic panel, and BNP in April 2020  If you have labs (blood work) drawn today and your tests are completely normal, you will receive your results only by: Marland Kitchen MyChart Message (if you have MyChart) OR . A paper copy in the mail If you have any lab test that is abnormal or we need to change your treatment, we will call you to review the results.  Testing/Procedures: None ordered  Follow-Up: At [facility], you and your health needs are our priority.  As part of our continuing mission to provide you with exceptional heart care, we have created designated Provider Care Teams.  These Care Teams include your primary Cardiologist (physician) and Advanced Practice Providers (APPs -  Physician Assistants and Nurse Practitioners) who all work together to provide you with the care you need, when you need it. . You will need a follow up appointment in 6 months.  Please call our office 2 months in advance to schedule this appointment.  You may see Sinclair Grooms, MD or one of the following Advanced Practice Providers on your designated Care Team:   . Truitt Merle, NP . Cecilie Kicks, NP . Kathyrn Drown, NP   Any Other Special Instructions Will Be Listed Below (If Applicable).     Signed, Sinclair Grooms, MD  04/30/2018 10:20 AM    [place]

## 2018-04-30 ENCOUNTER — Ambulatory Visit (INDEPENDENT_AMBULATORY_CARE_PROVIDER_SITE_OTHER): Payer: BLUE CROSS/BLUE SHIELD | Admitting: Interventional Cardiology

## 2018-04-30 ENCOUNTER — Encounter: Payer: Self-pay | Admitting: Interventional Cardiology

## 2018-04-30 VITALS — BP 114/82 | HR 77 | Ht 67.0 in | Wt 205.2 lb

## 2018-04-30 DIAGNOSIS — I10 Essential (primary) hypertension: Secondary | ICD-10-CM | POA: Diagnosis not present

## 2018-04-30 DIAGNOSIS — I25810 Atherosclerosis of coronary artery bypass graft(s) without angina pectoris: Secondary | ICD-10-CM | POA: Diagnosis not present

## 2018-04-30 DIAGNOSIS — I5022 Chronic systolic (congestive) heart failure: Secondary | ICD-10-CM | POA: Diagnosis not present

## 2018-04-30 DIAGNOSIS — G4730 Sleep apnea, unspecified: Secondary | ICD-10-CM

## 2018-04-30 DIAGNOSIS — E785 Hyperlipidemia, unspecified: Secondary | ICD-10-CM

## 2018-04-30 NOTE — Patient Instructions (Addendum)
Medication Instructions:  Your physician recommends that you continue on your current medications as directed. Please refer to the Current Medication list given to you today.  If you need a refill on your cardiac medications before your next appointment, please call your pharmacy.   Lab work: Your physician recommends that you return for a FASTING lipid profile, liver function panel, basic metabolic panel, Q9I, and BNP in April 2020  If you have labs (blood work) drawn today and your tests are completely normal, you will receive your results only by: Marland Kitchen MyChart Message (if you have MyChart) OR . A paper copy in the mail If you have any lab test that is abnormal or we need to change your treatment, we will call you to review the results.  Testing/Procedures: Your physician has recommended that you have a sleep study. This test records several body functions during sleep, including: brain activity, eye movement, oxygen and carbon dioxide blood levels, heart rate and rhythm, breathing rate and rhythm, the flow of air through your mouth and nose, snoring, body muscle movements, and chest and belly movement.  Follow-Up: At [facility], you and your health needs are our priority.  As part of our continuing mission to provide you with exceptional heart care, we have created designated Provider Care Teams.  These Care Teams include your primary Cardiologist (physician) and Advanced Practice Providers (APPs -  Physician Assistants and Nurse Practitioners) who all work together to provide you with the care you need, when you need it. . You will need a follow up appointment in 6 months.  Please call our office 2 months in advance to schedule this appointment.  You may see Sinclair Grooms, MD or one of the following Advanced Practice Providers on your designated Care Team:   . Truitt Merle, NP . Cecilie Kicks, NP . Kathyrn Drown, NP   Any Other Special Instructions Will Be Listed Below (If  Applicable).

## 2018-05-01 ENCOUNTER — Telehealth: Payer: Self-pay | Admitting: *Deleted

## 2018-05-01 NOTE — Telephone Encounter (Signed)
-----   Message from Cleon Gustin, [place] sent at 04/30/2018 11:29 AM EST ----- Regarding: Sleep Study Per Dr. Tamala Julian, patient needs Sleep Study for OSA. Please pre-cert and schedule patient. Thanks

## 2018-05-02 ENCOUNTER — Telehealth: Payer: Self-pay | Admitting: *Deleted

## 2018-05-02 NOTE — Telephone Encounter (Signed)
-----   Message from Cleon Gustin, [place] sent at 04/30/2018 11:29 AM EST ----- Regarding: Sleep Study Per Dr. Tamala Julian, patient needs Sleep Study for OSA. Please pre-cert and schedule patient. Thanks

## 2018-05-02 NOTE — Telephone Encounter (Signed)
Sleep study PA submitted to [facility] via web portal.

## 2018-05-07 ENCOUNTER — Telehealth: Payer: Self-pay | Admitting: *Deleted

## 2018-05-07 NOTE — Telephone Encounter (Signed)
Staff message sent to Gae Bon and Dr Etter Sjogren denied in lab sleep study. Approved HST. Auth # [id number]. Valid dates 05/07/18 to 07/05/18.

## 2018-05-09 ENCOUNTER — Other Ambulatory Visit: Payer: Self-pay | Admitting: Interventional Cardiology

## 2018-05-09 DIAGNOSIS — I10 Essential (primary) hypertension: Secondary | ICD-10-CM

## 2018-05-09 DIAGNOSIS — G4730 Sleep apnea, unspecified: Secondary | ICD-10-CM

## 2018-05-13 NOTE — Telephone Encounter (Signed)
Staff message sent to Gae Bon and Dr Etter Sjogren denied in lab sleep study. Approved HST. Auth # [id number]. Valid dates 05/07/18 to 07/05/18.

## 2018-05-13 NOTE — Telephone Encounter (Signed)
Patient is aware and agreeable to Home Sleep Study through [facility]. Patient is scheduled for 05/15/18 at 7:30 pm to pick up home sleep kit and meet with Respiratory therapist at [facility]. Patient is aware that if this appointment date and time does not work for them they should contact [place] directly at [phone]. Patient is aware that a sleep packet will be sent from [facility] in week. Patient is agreeable to treatment and thankful for call.

## 2018-05-20 ENCOUNTER — Ambulatory Visit: Payer: BLUE CROSS/BLUE SHIELD | Attending: Interventional Cardiology | Admitting: Cardiology

## 2018-05-20 DIAGNOSIS — G4733 Obstructive sleep apnea (adult) (pediatric): Secondary | ICD-10-CM | POA: Diagnosis not present

## 2018-05-20 DIAGNOSIS — R0902 Hypoxemia: Secondary | ICD-10-CM | POA: Diagnosis not present

## 2018-05-20 DIAGNOSIS — I10 Essential (primary) hypertension: Secondary | ICD-10-CM | POA: Insufficient documentation

## 2018-05-20 DIAGNOSIS — G4730 Sleep apnea, unspecified: Secondary | ICD-10-CM | POA: Diagnosis not present

## 2018-05-22 NOTE — Procedures (Signed)
    Patient Name: Scott Roth, Scott Roth Date: 05/20/2018   Gender: Male  D.O.B: 03-27-1956  Age (years): 62  Referring Provider: Daneen Schick  Height (inches): 50  Interpreting Physician: Fransico Him MD, ABSM  Weight (lbs): 185  RPSGT: Peak, Robert  BMI: 29  MRN: [id number]  Neck Size: 16.00  <br> <br>  CLINICAL INFORMATION  Sleep Study Type: HST Indication for sleep study: N/A Epworth Sleepiness Score: 18  Most recent polysomnogram dated 11/04/2014 revealed an AHI of 5.6/h and RDI of 10.1/h. Most recent titration study dated 12/23/2014 was optimal at 9cm H2O with an AHI of 0.0/h.  SLEEP STUDY TECHNIQUE  A multi-channel overnight portable sleep study was performed. The channels recorded were: nasal airflow, thoracic respiratory movement, and oxygen saturation with a pulse oximetry. Snoring was also monitored. MEDICATIONS  Patient self administered medications include: N/A. SLEEP ARCHITECTURE  Patient was studied for 364.4 minutes. The sleep efficiency was 67.5 % and the patient was supine for 26%. The arousal index was 0.0 per hour. RESPIRATORY PARAMETERS  The overall AHI was 74.9 per hour, with a central apnea index of 0.0 per hour. The oxygen nadir was 75% during sleep. CARDIAC DATA  Mean heart rate during sleep was 78.0 bpm. IMPRESSIONS  - Severe obstructive sleep apnea occurred during this study (AHI = 74.9/h).  - No significant central sleep apnea occurred during this study (CAI = 0.0/h).  - Severe oxygen desaturation was noted during this study (Min O2 = 75%).  - Patient snored 45.3% during the sleep. DIAGNOSIS  - Obstructive Sleep Apnea (327.23 [G47.33 ICD-10])  - Nocturnal Hypoxemia (327.26 [G47.36 ICD-10]) RECOMMENDATIONS  - Recommend in lab CPAP titration due to severity of OSA. May need BiPAP.  - Avoid alcohol, sedatives and other CNS depressants that may worsen sleep apnea and disrupt normal sleep architecture. - Sleep hygiene should be reviewed to assess  factors that may improve sleep quality.  - Weight management and regular exercise should be initiated or continued. [Electronically signed] 05/22/2018 04:20 PM Fransico Him MD, ABSM  Diplomate, American Board of Sleep Medicine

## 2018-05-23 ENCOUNTER — Telehealth: Payer: Self-pay | Admitting: *Deleted

## 2018-05-23 DIAGNOSIS — G4730 Sleep apnea, unspecified: Secondary | ICD-10-CM

## 2018-05-23 NOTE — Telephone Encounter (Signed)
-----   Message from Sueanne Margarita, MD sent at 05/22/2018  4:22 PM EST ----- Please let patient know that they have sleep apnea and recommend CPAP titration. Please set up titration in the sleep lab.

## 2018-05-23 NOTE — Telephone Encounter (Signed)
Informed patient of titration results and verbalized understanding was indicated. Patient understands his titration study showed  they have sleep apnea and recommend CPAP titration. Please set up titration in the sleep lab. Left detailed message on voicemail and informed patient to call back to confirm or reschedule.

## 2018-05-27 NOTE — Telephone Encounter (Signed)
Pt is aware and agreeable to test results.  Titration sent to precert.

## 2018-05-28 ENCOUNTER — Telehealth: Payer: Self-pay | Admitting: *Deleted

## 2018-05-28 NOTE — Telephone Encounter (Signed)
-----   Message from Freada Bergeron, [place] sent at 05/27/2018 10:27 AM EST ----- Regarding: precert Cpap titration

## 2018-05-28 NOTE — Telephone Encounter (Signed)
Staff message sent to [facility] received. Ok to schedule CPAP titration. Auth # [id number]. Valid dates 05/28/18 to 07/26/18.

## 2018-05-29 ENCOUNTER — Telehealth: Payer: Self-pay | Admitting: *Deleted

## 2018-05-29 ENCOUNTER — Encounter: Payer: Self-pay | Admitting: *Deleted

## 2018-05-29 NOTE — Telephone Encounter (Signed)
Patient is scheduled for CPAP Titration on 06/10/18. Patient understands his titration study will be done at [facility] sleep lab. Patient understands he will receive a letter in a week or so detailing appointment, date, time, and location. Patient understands to call if he does not receive the letter  in a timely manner. Lm PER DPR with son Elberta Fortis who will deliver message to patient.

## 2018-05-29 NOTE — Telephone Encounter (Signed)
-----   Message from Lauralee Evener, [place] sent at 05/28/2018  2:55 PM EST ----- Regarding: RE: precert [employer] auth received. Ok to schedule CPAP titration study. Auth # [id number]. Valid dates 06/06/18 to 07/26/18. ----- Message ----- From: Freada Bergeron, CMA Sent: 05/27/2018  10:27 AM EST To: [facility] Sleep Studies Subject: precert                                        Cpap titration

## 2018-06-10 ENCOUNTER — Ambulatory Visit: Payer: BLUE CROSS/BLUE SHIELD | Attending: Cardiology | Admitting: Cardiology

## 2018-06-10 DIAGNOSIS — G4733 Obstructive sleep apnea (adult) (pediatric): Secondary | ICD-10-CM | POA: Insufficient documentation

## 2018-06-10 DIAGNOSIS — G4730 Sleep apnea, unspecified: Secondary | ICD-10-CM

## 2018-06-14 NOTE — Procedures (Signed)
   Patient Name: Scott Roth, Scott Roth Date: 06/10/2018 Gender: Male D.O.B: 10-Aug-1955 Age (years): 63 Referring Provider: Daneen Schick Height (inches): 15 Interpreting Physician: Fransico Him MD, ABSM Weight (lbs): 185 RPSGT: Peak, Robert BMI: 29 MRN: [id number] Neck Size: 16.50  CLINICAL INFORMATION The patient is referred for a CPAP titration to treat sleep apnea.  SLEEP STUDY TECHNIQUE As per the AASM Manual for the Scoring of Sleep and Associated Events v2.3 (April 2016) with a hypopnea requiring 4% desaturations.  The channels recorded and monitored were frontal, central and occipital EEG, electrooculogram (EOG), submentalis EMG (chin), nasal and oral airflow, thoracic and abdominal wall motion, anterior tibialis EMG, snore microphone, electrocardiogram, and pulse oximetry. Continuous positive airway pressure (CPAP) was initiated at the beginning of the study and titrated to treat sleep-disordered breathing.  MEDICATIONS Medications self-administered by patient taken the night of the study : N/A  TECHNICIAN COMMENTS Comments added by technician: Patient had difficulty returning back to sleep after bathroom break. Comments added by scorer: N/A  RESPIRATORY PARAMETERS Optimal PAP Pressure (cm): 9  AHI at Optimal Pressure (/hr):1.4 Overall Minimal O2 (%):88.0  Supine % at Optimal Pressure (%):N/A Minimal O2 at Optimal Pressure (%): 88.0   SLEEP ARCHITECTURE The study was initiated at 10:54:26 PM and ended at 5:19:44 AM.  Sleep onset time was 12.8 minutes and the sleep efficiency was 70.7%. The total sleep time was 272.5 minutes.  The patient spent 10.8% of the night in stage N1 sleep, 70.3% in stage N2 sleep, 0.0% in stage N3 and 18.9% in REM.Stage REM latency was 60.5 minutes  Wake after sleep onset was 100.0. Alpha intrusion was absent. Supine sleep was 1.10%.  CARDIAC DATA The 2 lead EKG demonstrated sinus rhythm. The mean heart rate was 66.8 beats per minute.  Other EKG findings include: PVCs.  LEG MOVEMENT DATA The total Periodic Limb Movements of Sleep (PLMS) were 0. The PLMS index was 0.0. A PLMS index of <15 is considered normal in adults.  IMPRESSIONS - The optimal PAP pressure was 9 cm of water. - Central sleep apnea was not noted during this titration (CAI = 0.0/h). - Mild oxygen desaturations were observed during this titration (min O2 = 88.0%). - No snoring was audible during this study. - 2-lead EKG demonstrated: PVCs - Clinically significant periodic limb movements were not noted during this study. Arousals associated with PLMs were rare.  DIAGNOSIS - Obstructive Sleep Apnea (327.23 [G47.33 ICD-10])  RECOMMENDATIONS - Trial of CPAP therapy on 9 cm H2O with a Standard size Resmed Nasal Mask AirFit N30I mask and heated humidification. - Avoid alcohol, sedatives and other CNS depressants that may worsen sleep apnea and disrupt normal sleep architecture. - Sleep hygiene should be reviewed to assess factors that may improve sleep quality. - Weight management and regular exercise should be initiated or continued. - Return to Sleep Center for re-evaluation after 10 weeks of therapy  [Electronically signed] 06/14/2018 08:24 PM  Fransico Him MD, ABSM Diplomate, American Board of Sleep Medicine

## 2018-06-16 ENCOUNTER — Telehealth: Payer: Self-pay | Admitting: *Deleted

## 2018-06-16 NOTE — Telephone Encounter (Signed)
-----   Message from Sueanne Margarita, MD sent at 06/14/2018  8:29 PM EST ----- Please let patient know that they had a successful PAP titration and let DME know that orders are in EPIC.  Please set up 10 week OV with me.

## 2018-06-16 NOTE — Telephone Encounter (Signed)
Informed patient of titration results and verbalized understanding was indicated. Patient understands his titration study showed they had a successful PAP titration and orders are in EPIC. Please set up 10 week OV with me.   Upon patient request DME selection is [facility]. Patient understands he will be contacted by [facility] to set up his cpap. Patient understands to call if [facility] does not contact him with new setup in a timely manner. Patient understands they will be called once confirmation has been received from [facility] that they have received their new machine to schedule 10 week follow up appointment.  [facility] notified of new cpap order  Please add to airview Patient was grateful for the call and thanked me.

## 2018-06-27 ENCOUNTER — Encounter: Payer: Self-pay | Admitting: Interventional Cardiology

## 2018-06-27 NOTE — Telephone Encounter (Signed)
error 

## 2018-06-30 DIAGNOSIS — N419 Inflammatory disease of prostate, unspecified: Secondary | ICD-10-CM | POA: Diagnosis not present

## 2018-07-11 ENCOUNTER — Telehealth: Payer: Self-pay | Admitting: Cardiology

## 2018-07-11 NOTE — Telephone Encounter (Signed)
Patient states he has not heard from the medical supply company. I reached out to [facility] and learned they have tried twice to reach the patient on 3/6 and 3/16 and have left messages with no respnonse. Reached back out to the patient and gave him the number to call [facility] [phone]. Pt is aware and agreeable to treatment.

## 2018-07-11 NOTE — Telephone Encounter (Signed)
Patient is returning call.  °

## 2018-07-22 ENCOUNTER — Telehealth: Payer: Self-pay | Admitting: Interventional Cardiology

## 2018-07-22 NOTE — Telephone Encounter (Signed)
Will route to Dr. Smith for review.  

## 2018-07-22 NOTE — Telephone Encounter (Signed)
Pt called. He said he needs a letter from Dr. Tamala Julian stating that due to his health condition, his wife needs to come home. She is currently stranded in [place], and is not allowed to leave unless it is medically necessary.

## 2018-07-24 ENCOUNTER — Encounter: Payer: Self-pay | Admitting: *Deleted

## 2018-07-24 NOTE — Telephone Encounter (Signed)
To whom it may concern,   Mr. Amear Strojny.  Vickrey has significant underlying cardiac conditions including coronary artery disease and systolic heart failure.  He requires family support from his wife to help manage his activities of daily living, ensure the medications are taken consistently, and to alert others/healthcare if his condition takes a turn for the worse.   If I may provide further information, do not hesitate to call or write.    Sincerely,   Illene Labrador, III, MD

## 2018-07-24 NOTE — Telephone Encounter (Signed)
Left message letting pt know letter has been mailed.  Advised to call back if any questions.

## 2018-07-30 ENCOUNTER — Other Ambulatory Visit: Payer: BLUE CROSS/BLUE SHIELD

## 2018-08-13 ENCOUNTER — Other Ambulatory Visit: Payer: BLUE CROSS/BLUE SHIELD | Admitting: *Deleted

## 2018-08-13 ENCOUNTER — Other Ambulatory Visit: Payer: Self-pay

## 2018-08-13 DIAGNOSIS — I10 Essential (primary) hypertension: Secondary | ICD-10-CM | POA: Diagnosis not present

## 2018-08-13 DIAGNOSIS — I25810 Atherosclerosis of coronary artery bypass graft(s) without angina pectoris: Secondary | ICD-10-CM | POA: Diagnosis not present

## 2018-08-13 DIAGNOSIS — I5022 Chronic systolic (congestive) heart failure: Secondary | ICD-10-CM

## 2018-08-13 DIAGNOSIS — E785 Hyperlipidemia, unspecified: Secondary | ICD-10-CM

## 2018-08-14 LAB — LIPID PANEL
Chol/HDL Ratio: 3.2 ratio (ref 0.0–5.0)
Cholesterol, Total: 136 mg/dL (ref 100–199)
HDL: 42 mg/dL
LDL Calculated: 55 mg/dL (ref 0–99)
Triglycerides: 197 mg/dL — ABNORMAL HIGH (ref 0–149)
VLDL Cholesterol Cal: 39 mg/dL (ref 5–40)

## 2018-08-14 LAB — HEPATIC FUNCTION PANEL
ALT: 24 [IU]/L (ref 0–44)
AST: 23 [IU]/L (ref 0–40)
Albumin: 4.2 g/dL (ref 3.8–4.8)
Alkaline Phosphatase: 64 [IU]/L (ref 39–117)
Bilirubin Total: 0.6 mg/dL (ref 0.0–1.2)
Bilirubin, Direct: 0.16 mg/dL (ref 0.00–0.40)
Total Protein: 7.1 g/dL (ref 6.0–8.5)

## 2018-08-14 LAB — BASIC METABOLIC PANEL WITH GFR
BUN/Creatinine Ratio: 18 (ref 10–24)
BUN: 17 mg/dL (ref 8–27)
CO2: 19 mmol/L — ABNORMAL LOW (ref 20–29)
Calcium: 9.2 mg/dL (ref 8.6–10.2)
Chloride: 102 mmol/L (ref 96–106)
Creatinine, Ser: 0.93 mg/dL (ref 0.76–1.27)
GFR calc Af Amer: 101 mL/min/{1.73_m2}
GFR calc non Af Amer: 87 mL/min/{1.73_m2}
Glucose: 123 mg/dL — ABNORMAL HIGH (ref 65–99)
Potassium: 4.5 mmol/L (ref 3.5–5.2)
Sodium: 137 mmol/L (ref 134–144)

## 2018-08-14 LAB — HEMOGLOBIN A1C
Est. average glucose Bld gHb Est-mCnc: 151 mg/dL
Hgb A1c MFr Bld: 6.9 % — ABNORMAL HIGH (ref 4.8–5.6)

## 2018-08-14 LAB — PRO B NATRIURETIC PEPTIDE: NT-Pro BNP: 58 pg/mL (ref 0–210)

## 2018-08-18 DIAGNOSIS — I25810 Atherosclerosis of coronary artery bypass graft(s) without angina pectoris: Secondary | ICD-10-CM | POA: Diagnosis not present

## 2018-08-18 DIAGNOSIS — E7800 Pure hypercholesterolemia, unspecified: Secondary | ICD-10-CM | POA: Diagnosis not present

## 2018-08-18 DIAGNOSIS — R7301 Impaired fasting glucose: Secondary | ICD-10-CM | POA: Diagnosis not present

## 2018-08-18 DIAGNOSIS — K219 Gastro-esophageal reflux disease without esophagitis: Secondary | ICD-10-CM | POA: Diagnosis not present

## 2018-08-21 ENCOUNTER — Encounter: Payer: Self-pay | Admitting: *Deleted

## 2018-09-05 DIAGNOSIS — Z202 Contact with and (suspected) exposure to infections with a predominantly sexual mode of transmission: Secondary | ICD-10-CM | POA: Diagnosis not present

## 2018-09-05 DIAGNOSIS — R350 Frequency of micturition: Secondary | ICD-10-CM | POA: Diagnosis not present

## 2018-09-05 DIAGNOSIS — N419 Inflammatory disease of prostate, unspecified: Secondary | ICD-10-CM | POA: Diagnosis not present

## 2018-10-13 ENCOUNTER — Other Ambulatory Visit: Payer: BLUE CROSS/BLUE SHIELD

## 2018-10-30 ENCOUNTER — Other Ambulatory Visit: Payer: Self-pay | Admitting: Nurse Practitioner

## 2018-11-23 NOTE — Progress Notes (Signed)
CARDIOLOGY OFFICE NOTE  Date:  11/25/2018    Scott Roth Date of Birth: 1955-07-31 Medical Record #[id number]  PCP:  Manfred Shirts, PA  Cardiologist:  Jennings Books   Chief Complaint  Patient presents with  . Coronary Artery Disease    Follow up visit - seen for Dr. Tamala Julian    History of Present Illness: Scott Roth is a 63 y.o. male who presents today for a follow up visit. Seen for Dr. Tamala Julian.   He has a history of CAD,s/p CABG in 2006 and subsequent PCI with DES to the LAD/D1 in 2330 (cath complicated by VF), systolic CHF,hypertension,hyperlipidemia&OSA not on CPAP.Nuclear stress testin 4/19, done forchest pain, demonstrated newly depressed LVF andcardiaccatheterizationwas recommended in 4/19whichdemonstrated patent L-LAD, patent RIMA-OM1, occluded S-Dx and an occluded S-OM2. His stents to the LAD and Dx were patent. Medical Rx was continued.  He was last seen here back in January by Dr. Tamala Julian - no angina. Some wheezing with too much alcohol or fluid intake. No swelling.   The patient does not have symptoms concerning for COVID-19 infection (fever, chills, cough, or new shortness of breath).   Comes in today. Here alone. He is doing well. No chest pain. Breathing is good. He has lost a few pounds. He has stopped his alcohol intake - says it made him feel too bad. Has not had any income over the past 6 months due to [place]. Wife got stuck in [place] and had issues with trying to get back in to the [place]. Delene Loll is pretty costly for him. BP is good. He has some aching when he first wakes up - wonders if it is related to statin - but it goes away as the day goes on and he starts moving.   Past Medical History:  Diagnosis Date  . Arthritis   . CAD (coronary artery disease)    a. CABG x4 with LIMA to dLAD, free RIMA to OM1, SVG to D1, SVG to OM2 in 2006. b. PCI 2008 with notable ventricular fibrillation during cath) - DES to proxLAD/1stdiagonal.  c. Cath 07/25/17 demonstrated previously placed stents, patent LIMA-LAD, patent free RIMA-OM, occluded VG-diagonal and occluded Y graft-OM2, without significant change from prior, elevated LVEDP.  . Cardiomyopathy ([place])    EF prev normal 2004, 2008 // EF 35% by cath in 07/2017 // Echo 8/19:  Mild conc LVH, EF 40-45, diff HK, Gr 1 DD, mild AI, mild MR, mod LAE, normal RVSF, mild RAE, mild TR  . Chronic systolic CHF (congestive heart failure) ([place])   . Erectile dysfunction   . GERD (gastroesophageal reflux disease)   . Hyperlipidemia   . Hypertension   . Sleep apnea    does not wear mask  . Tubular adenoma     Past Surgical History:  Procedure Laterality Date  . 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 CORONARY ARTERY BYPASS GRAFT  2008  . EYE SURGERY Bilateral    Lasik  . KNEE ARTHROPLASTY Left   . LEFT HEART CATH AND CORS/GRAFTS ANGIOGRAPHY N/A 07/25/2017   Procedure: LEFT HEART CATH AND CORS/GRAFTS ANGIOGRAPHY;  Surgeon: Lorretta Harp, MD;  Location: [facility];  Service: Cardiovascular;  Laterality: N/A;  . SHOULDER SURGERY Right   . TOTAL KNEE ARTHROPLASTY Right 08/25/2014  . TOTAL KNEE ARTHROPLASTY Right 08/25/2014   Procedure: RIGHT TOTAL KNEE ARTHROPLASTY;  Surgeon: Kathryne Hitch, MD;  Location: [facility];  Service: Orthopedics;  Laterality: Right;     Medications: Current Meds  Medication Sig  . aspirin EC 81 MG tablet Take 1 tablet (81 mg total) by mouth daily.  . Coenzyme Q10 (CO Q-10) 300 MG CAPS Take 1 capsule by mouth daily.  . Cyanocobalamin (B-12 PO) Take 1 tablet by mouth daily.   . furosemide (LASIX) 20 MG tablet TAKE 1 TABLET BY MOUTH EVERY DAY AS NEEDED FOR SWELLING, WEIGHT GAIN OR SHORTNESS OF BREATH  . GARLIC PO Take 1 tablet by mouth daily.  Marland Kitchen MAGNESIUM PO Take 500 mg by mouth daily.   . metoprolol succinate (TOPROL-XL) 25 MG 24 hr tablet Take 1 tablet (25 mg total) by mouth daily.  . Multiple  Vitamins-Minerals (MULTIVITAMIN PO) Take 1 tablet by mouth daily.   . nitroGLYCERIN (NITROSTAT) 0.4 MG SL tablet PLACE 1 TABLET UNDER THE TONGUE EVERY 5 (FIVE) MINUTES X 3 DOSES AS NEEDED FOR CHEST PAIN.  Marland Kitchen pantoprazole (PROTONIX) 40 MG tablet Take 40 mg by mouth daily as needed.  . rosuvastatin (CRESTOR) 40 MG tablet Take 0.5 tablets (20 mg total) by mouth daily.  . sacubitril-valsartan (ENTRESTO) 97-103 MG Take 1 tablet by mouth 2 (two) times daily.  . TURMERIC PO Take 500 mg by mouth daily.   . vitamin C (ASCORBIC ACID) 500 MG tablet Take 500 mg by mouth daily.  Marland Kitchen VITAMIN E PO Take 400 mg by mouth daily.      Allergies: Allergies  Allergen Reactions  . Penicillins Itching and Rash    Other reaction(s): Other (See Comments) Has patient had a PCN reaction causing immediate rash, facial/tongue/throat swelling, SOB or lightheadedness with hypotension: No Has patient had a PCN reaction causing severe rash involving mucus membranes or skin necrosis: No Has patient had a PCN reaction that required hospitalization: No Has patient had a PCN reaction occurring within the last 10 years: No If all of the above answers are "NO", then may proceed with Cephalosporin use.    Social History: The patient  reports that he has never smoked. He has never used smokeless tobacco. He reports that he does not drink alcohol or use drugs.   Family History: The patient's family history includes Heart failure in his father; Hypertension in his mother.   Review of Systems: Please see the history of present illness.   All other systems are reviewed and negative.   Physical Exam: VS:  BP 116/80   Pulse 61   Ht 5\' 7"  (1.702 m)   Wt 201 lb 12.8 oz (91.5 kg)   SpO2 97%   BMI 31.61 kg/m  .  BMI Body mass index is 31.61 kg/m.  Wt Readings from Last 3 Encounters:  11/25/18 201 lb 12.8 oz (91.5 kg)  04/30/18 205 lb 3.2 oz (93.1 kg)  03/03/18 205 lb 1.9 oz (93 kg)    General: Pleasant. Well developed,  well nourished and in no acute distress.   HEENT: Normal.  Neck: Supple, no JVD, carotid bruits, or masses noted.  Cardiac: Regular rate and rhythm. No murmurs, rubs, or gallops. No edema.  Respiratory:  Lungs are clear to auscultation bilaterally with normal work of breathing.  GI: Soft and nontender.  MS: No deformity or atrophy. Gait and ROM intact.  Skin: Warm and dry. Color is normal.  Neuro:  Strength and sensation are intact and no gross focal deficits noted.  Psych: Alert, appropriate and with normal affect.   LABORATORY DATA:  EKG:  EKG  is ordered today. This demonstrates NSR.  Lab Results  Component Value Date   WBC 10.0 09/18/2017   HGB 16.2 09/18/2017   HCT 46.0 09/18/2017   PLT 287 09/18/2017   GLUCOSE 123 (H) 08/13/2018   CHOL 136 08/13/2018   TRIG 197 (H) 08/13/2018   HDL 42 08/13/2018   LDLCALC 55 08/13/2018   ALT 24 08/13/2018   AST 23 08/13/2018   NA 137 08/13/2018   K 4.5 08/13/2018   CL 102 08/13/2018   CREATININE 0.93 08/13/2018   BUN 17 08/13/2018   CO2 19 (L) 08/13/2018   PSA 0.36 08/31/2013   INR 0.95 07/24/2017   HGBA1C 6.9 (H) 08/13/2018     BNP (last 3 results) No results for input(s): BNP in the last 8760 hours.  ProBNP (last 3 results) Recent Labs    03/03/18 1540 08/13/18 0834  PROBNP 43 58     Other Studies Reviewed Today:  2D Doppler echocardiogram December 06, 2017: Study Conclusions  - Left ventricle: The cavity size was normal. There was mild concentric hypertrophy. Systolic function was mildly to moderately reduced. The estimated ejection fraction was in the range of 40% to 45%. Diffuse hypokinesis. Doppler parameters are consistent with abnormal left ventricular relaxation (grade 1 diastolic dysfunction). There was no evidence of elevated ventricular filling pressure by Doppler parameters. - Aortic valve: There was mild regurgitation. - Mitral valve: There was mild regurgitation. - Left atrium: The  atrium was moderately dilated. - Right ventricle: Systolic function was normal. - Right atrium: The atrium was mildly dilated. - Tricuspid valve: There was mild regurgitation. - Pulmonary arteries: Systolic pressure was within the normal range. - Inferior vena cava: The vessel was normal in size.  Assessment/Plan:  1. CAD - has had remote CABG in 2006 and subsequent PCI with DES to the LAD/D1 in 1610 (cath complicated by VF). Nuclear stress testin 4/19, done forchest pain, demonstrated newly depressed LVF andcardiaccatheterizationwas recommended in 4/19whichdemonstrated patent L-LAD, patent RIMA-OM1, occluded S-Dx and an occluded S-OM2. His stents to the LAD and Dx were patent. He has been managed medically. His labs are from April - will need recheck on return.   2 Chronic systolic HF - EF of 40 to 45% - doing well clinically. Will try to get him some help with cost of Entresto. Still with commercial insurance.   3. HTN - BP is great - no changes made today.   4. HLD - on statin - I don't think his aching is related to his statin since it improves as the course of the day goes - sounds more like arthritis to me.   5. OSA  6. COVID-19 Education: The signs and symptoms of COVID-19 were discussed with the patient and how to seek care for testing (follow up with PCP or arrange E-visit).  The importance of social distancing, staying at home, hand hygiene and wearing a mask when out in public were discussed today.  Current medicines are reviewed with the patient today.  The patient does not have concerns regarding medicines other than what has been noted above.  The following changes have been made:  See above.  Labs/ tests ordered today include:    Orders Placed This Encounter  Procedures  . EKG 12-Lead     Disposition:   FU with Dr. Tamala Julian in 6 months.   Patient is agreeable to this plan and will call if any problems develop in the interim.   SignedTruitt Merle, NP   11/25/2018  10:31 AM  [facility] [address] [place],   [address] Phone: [phone] Fax: [phone]

## 2018-11-25 ENCOUNTER — Encounter: Payer: Self-pay | Admitting: Nurse Practitioner

## 2018-11-25 ENCOUNTER — Other Ambulatory Visit: Payer: Self-pay

## 2018-11-25 ENCOUNTER — Ambulatory Visit: Payer: BC Managed Care – PPO | Admitting: Nurse Practitioner

## 2018-11-25 VITALS — BP 116/80 | HR 61 | Ht 67.0 in | Wt 201.8 lb

## 2018-11-25 DIAGNOSIS — I10 Essential (primary) hypertension: Secondary | ICD-10-CM

## 2018-11-25 DIAGNOSIS — I25810 Atherosclerosis of coronary artery bypass graft(s) without angina pectoris: Secondary | ICD-10-CM

## 2018-11-25 DIAGNOSIS — E785 Hyperlipidemia, unspecified: Secondary | ICD-10-CM

## 2018-11-25 DIAGNOSIS — I5022 Chronic systolic (congestive) heart failure: Secondary | ICD-10-CM | POA: Diagnosis not present

## 2018-11-25 NOTE — Patient Instructions (Addendum)
After Visit Summary:  We will be checking the following labs today - NONE   Medication Instructions:    Continue with your current medicines.   We will see if there is a copay card here for [facility] and we will see if we can get you some assistance.    If you need a refill on your cardiac medications before your next appointment, please call your pharmacy.     Testing/Procedures To Be Arranged:  N/A  Follow-Up:   See Dr. Tamala Julian in 6 months    At [facility], you and your health needs are our priority.  As part of our continuing mission to provide you with exceptional heart care, we have created designated Provider Care Teams.  These Care Teams include your primary Cardiologist (physician) and Advanced Practice Providers (APPs -  Physician Assistants and Nurse Practitioners) who all work together to provide you with the care you need, when you need it.  Special Instructions:  . Stay safe, stay home, wash your hands for at least 20 seconds and wear a mask when out in public.  . It was good to talk with you today.  Marland Kitchen Keep up the good work.    Call the [place] office at [phone] if you have any questions, problems or concerns.

## 2018-12-24 ENCOUNTER — Other Ambulatory Visit: Payer: Self-pay

## 2018-12-24 DIAGNOSIS — R6889 Other general symptoms and signs: Secondary | ICD-10-CM | POA: Diagnosis not present

## 2018-12-24 DIAGNOSIS — Z20822 Contact with and (suspected) exposure to covid-19: Secondary | ICD-10-CM

## 2018-12-26 ENCOUNTER — Other Ambulatory Visit: Payer: Self-pay | Admitting: Physician Assistant

## 2018-12-26 LAB — NOVEL CORONAVIRUS, NAA: SARS-CoV-2, NAA: NOT DETECTED

## 2019-01-01 ENCOUNTER — Telehealth: Payer: Self-pay | Admitting: *Deleted

## 2019-01-01 DIAGNOSIS — E785 Hyperlipidemia, unspecified: Secondary | ICD-10-CM

## 2019-01-01 NOTE — Telephone Encounter (Signed)
-----   Message from Loren Racer, LPN sent at 075-GRM 10:48 AM EDT ----- Regarding: Liver, Lipid Liver and lipid in late aug/early sept

## 2019-01-01 NOTE — Telephone Encounter (Signed)
Left message for pt to call back and schedule fasting labs (nothing to eat or drink after midnight except water and black coffee).  Orders in [place].

## 2019-01-05 NOTE — Telephone Encounter (Signed)
New message   Patient is returning call per previous message. Please call.

## 2019-01-05 NOTE — Telephone Encounter (Signed)
LMTCB

## 2019-01-07 NOTE — Telephone Encounter (Signed)
LMTCB

## 2019-01-07 NOTE — Telephone Encounter (Signed)
Pt to come in 01/08/19 for fasting labs

## 2019-01-08 ENCOUNTER — Other Ambulatory Visit: Payer: Self-pay

## 2019-01-08 ENCOUNTER — Other Ambulatory Visit: Payer: BC Managed Care – PPO | Admitting: *Deleted

## 2019-01-08 DIAGNOSIS — E785 Hyperlipidemia, unspecified: Secondary | ICD-10-CM | POA: Diagnosis not present

## 2019-01-08 LAB — LIPID PANEL
Chol/HDL Ratio: 2.7 ratio (ref 0.0–5.0)
Cholesterol, Total: 114 mg/dL (ref 100–199)
HDL: 42 mg/dL (ref >39)
LDL Chol Calc (NIH): 51 mg/dL (ref 0–99)
Triglycerides: 119 mg/dL (ref 0–149)
VLDL Cholesterol Cal: 21 mg/dL (ref 5–40)

## 2019-01-08 LAB — HEPATIC FUNCTION PANEL
ALT: 23 IU/L (ref 0–44)
AST: 19 IU/L (ref 0–40)
Albumin: 4.3 g/dL (ref 3.8–4.8)
Alkaline Phosphatase: 59 IU/L (ref 39–117)
Bilirubin Total: 0.7 mg/dL (ref 0.0–1.2)
Bilirubin, Direct: 0.17 mg/dL (ref 0.00–0.40)
Total Protein: 6.9 g/dL (ref 6.0–8.5)

## 2019-01-09 ENCOUNTER — Telehealth: Payer: Self-pay | Admitting: *Deleted

## 2019-01-09 DIAGNOSIS — E785 Hyperlipidemia, unspecified: Secondary | ICD-10-CM

## 2019-01-09 NOTE — Telephone Encounter (Signed)
Pt has been notified of lab results and recommendation to repeat labs in 9 months. Pt is scheduled for fasting lipid and liver panel 10/12/19. Pt thanked me for the call. appt has been made. I will fax results to PCP [name], PA.  Patient notified of result.  Please refer to phone note from today for complete details.   Julaine Hua, [facility] 01/09/2019 10:43 AM

## 2019-01-09 NOTE — Telephone Encounter (Signed)
-----   Message from Belva Crome, MD sent at 01/08/2019  4:53 PM EDT ----- Let the patient know the labs are excellent! Recheck in 9 months. A copy will be sent to Manfred Shirts, [place]

## 2019-01-22 ENCOUNTER — Other Ambulatory Visit: Payer: Self-pay

## 2019-01-22 DIAGNOSIS — Z20828 Contact with and (suspected) exposure to other viral communicable diseases: Secondary | ICD-10-CM | POA: Diagnosis not present

## 2019-01-22 DIAGNOSIS — Z20822 Contact with and (suspected) exposure to covid-19: Secondary | ICD-10-CM

## 2019-01-24 LAB — NOVEL CORONAVIRUS, NAA: SARS-CoV-2, NAA: NOT DETECTED

## 2019-02-02 ENCOUNTER — Telehealth: Payer: Self-pay | Admitting: General Practice

## 2019-02-02 NOTE — Telephone Encounter (Signed)
Negative COVID results given. Patient results "NOT Detected." Caller expressed understanding. ° °

## 2019-02-24 ENCOUNTER — Other Ambulatory Visit: Payer: Self-pay | Admitting: Nurse Practitioner

## 2019-04-29 ENCOUNTER — Other Ambulatory Visit: Payer: Self-pay | Admitting: Nurse Practitioner

## 2019-05-18 ENCOUNTER — Telehealth: Payer: Self-pay

## 2019-05-18 NOTE — Telephone Encounter (Signed)
**Note De-Identified Kaydie Petsch Obfuscation** I stated n  Entresto PA through covermymeds. Key: [id number]

## 2019-05-28 ENCOUNTER — Telehealth: Payer: Self-pay | Admitting: Interventional Cardiology

## 2019-05-28 NOTE — Telephone Encounter (Signed)
Pt c/o medication issue:  1. Name of Medication: rosuvastatin (CRESTOR) 40 MG tablet  2. How are you currently taking this medication (dosage and times per day)? As directed  3. Are you having a reaction (difficulty breathing--STAT)? No  4. What is your medication issue? Patient states that he has switched insurance to [employer]. His insurance company is trying to get authorization for his medications but state that they have been unsuccessful on getting a hold of someone. He states that he knows Crestor is one of the medications but was unsure of what other's need authorization.

## 2019-05-28 NOTE — Telephone Encounter (Signed)
**Note De-Identified Maurio Baize Obfuscation** I called [facility] pharmacy and was advised that a Rosuvastatin PA is not needed per the pts plan and the cost to the pt is $37/90 day supply. They checked the status of the pts Entresto and state that the pts Ins is paying their part so the PA was approved and that the cost to the pt is now $100/30 day supply. They advised me that the pt did pick up his Entresto and paid the $100. I was also advised that it does not look like the pt has a deductible to meet this is just the cost per his plan.

## 2019-05-28 NOTE — Telephone Encounter (Signed)
We are recommending the COVID-19 vaccine to all of our patients. Cardiac medications (including blood thinners) should not deter anyone from being vaccinated and there is no need to hold any of those medications prior to vaccine administration.     Currently, there is a hotline to call (active 04/24/19) to schedule vaccination appointments as no walk-ins will be accepted.   Number: [phone].    If an appointment is not available please go to [email or web address] to sign up for notification when additional vaccine appointments are available.   If you have further questions or concerns about the vaccine process, please visit [email or web address] or contact your primary care physician.   

## 2019-05-28 NOTE — Telephone Encounter (Signed)
**Note De-Identified Marquest Gunkel Obfuscation** Message received through covermymeds: [place] Key: Herma Mering - PA Case ID: [id number] Outcome  Approved on February 1  PA Case: [id number]  Status: Approved  Coverage Starts on: 05/18/2019 12:00:00 AM, Coverage Ends on: 05/17/2020 12:00:00 AM.  DrugEntresto 97-103MG  tablets  [employer] 4-Part NCPDP Electronic PA Form    I have notified the pts pharmacy of this approval.

## 2019-05-28 NOTE — Telephone Encounter (Signed)
Called pt and asked him to make sure the pharmacy will send over a fax request with the Prior Authorization request so we can take care of it. Will also fwd to [employer], LPN to make aware.

## 2019-06-03 ENCOUNTER — Ambulatory Visit (INDEPENDENT_AMBULATORY_CARE_PROVIDER_SITE_OTHER): Payer: BC Managed Care – PPO | Admitting: Nurse Practitioner

## 2019-06-18 ENCOUNTER — Ambulatory Visit (INDEPENDENT_AMBULATORY_CARE_PROVIDER_SITE_OTHER): Payer: BC Managed Care – PPO | Admitting: Nurse Practitioner

## 2019-06-30 ENCOUNTER — Other Ambulatory Visit: Payer: Self-pay

## 2019-06-30 ENCOUNTER — Encounter (INDEPENDENT_AMBULATORY_CARE_PROVIDER_SITE_OTHER): Payer: Self-pay | Admitting: Internal Medicine

## 2019-06-30 ENCOUNTER — Ambulatory Visit (INDEPENDENT_AMBULATORY_CARE_PROVIDER_SITE_OTHER): Payer: PRIVATE HEALTH INSURANCE | Admitting: Internal Medicine

## 2019-06-30 VITALS — BP 132/78 | HR 63 | Temp 97.3°F | Resp 18 | Ht 67.0 in | Wt 205.0 lb

## 2019-06-30 DIAGNOSIS — I10 Essential (primary) hypertension: Secondary | ICD-10-CM

## 2019-06-30 DIAGNOSIS — E119 Type 2 diabetes mellitus without complications: Secondary | ICD-10-CM

## 2019-06-30 DIAGNOSIS — N529 Male erectile dysfunction, unspecified: Secondary | ICD-10-CM

## 2019-06-30 DIAGNOSIS — I2510 Atherosclerotic heart disease of native coronary artery without angina pectoris: Secondary | ICD-10-CM

## 2019-06-30 DIAGNOSIS — Z1159 Encounter for screening for other viral diseases: Secondary | ICD-10-CM

## 2019-06-30 DIAGNOSIS — E559 Vitamin D deficiency, unspecified: Secondary | ICD-10-CM

## 2019-06-30 DIAGNOSIS — D229 Melanocytic nevi, unspecified: Secondary | ICD-10-CM

## 2019-06-30 DIAGNOSIS — Z1211 Encounter for screening for malignant neoplasm of colon: Secondary | ICD-10-CM

## 2019-06-30 DIAGNOSIS — I5022 Chronic systolic (congestive) heart failure: Secondary | ICD-10-CM | POA: Diagnosis not present

## 2019-06-30 DIAGNOSIS — E782 Mixed hyperlipidemia: Secondary | ICD-10-CM | POA: Diagnosis not present

## 2019-06-30 DIAGNOSIS — Z125 Encounter for screening for malignant neoplasm of prostate: Secondary | ICD-10-CM

## 2019-06-30 NOTE — Progress Notes (Signed)
Metrics: Intervention Frequency ACO  Documented Smoking Status Yearly  Screened one or more times in 24 months  Cessation Counseling or  Active cessation medication Past 24 months  Past 24 months   Guideline developer: UpToDate (See UpToDate for funding source) Date Released: 2014       Wellness Office Visit  Subjective:  Patient ID: Scott Roth, male    DOB: May 23, 1955  Age: 64 y.o. MRN: [id number]  CC: This 64 year old man comes in as a new patient to be established. HPI  He has a history of coronary artery disease with a history of CABG in 2006. He follows with cardiology. He currently does not have any symptoms worrisome for acute coronary syndrome. He denies any chest pain, dyspnea, palpitations or limb weakness. He does describe fatigue. He does describe a degree of erectile dysfunction. Approximately 1 year ago, April 2020, hemoglobin A1c was 6.9%. There was some information lost in transition as the patient was not aware that he was diabetic. He describes myalgia probably related to Crestor that he had been taking and he has cut the dose in half which is improved his myalgia. He was also concerned about a mole he has on the left upper back and his wife is concerned about this. Past Medical History:  Diagnosis Date  . Arthritis   . CAD (coronary artery disease)    a. CABG x4 with LIMA to dLAD, free RIMA to OM1, SVG to D1, SVG to OM2 in 2006. b. PCI 2008 with notable ventricular fibrillation during cath) - DES to proxLAD/1stdiagonal. c. Cath 07/25/17 demonstrated previously placed stents, patent LIMA-LAD, patent free RIMA-OM, occluded VG-diagonal and occluded Y graft-OM2, without significant change from prior, elevated LVEDP.  . Cardiomyopathy ([place])    EF prev normal 2004, 2008 // EF 35% by cath in 07/2017 // Echo 8/19:  Mild conc LVH, EF 40-45, diff HK, Gr 1 DD, mild AI, mild MR, mod LAE, normal RVSF, mild RAE, mild TR  . Chronic systolic CHF (congestive heart failure)  ([place])   . Erectile dysfunction   . GERD (gastroesophageal reflux disease)   . Hyperlipidemia   . Hypertension   . Sleep apnea    does not wear mask  . Tubular adenoma       Family History  Problem Relation Age of Onset  . Heart failure Father   . Hypertension Mother     Social History   Social History Narrative   Married for 2 years.Lives with wife and step daughter.Originally born [place].Retired,business entrepeuer-owns several companies.   Social History   Tobacco Use  . Smoking status: Never Smoker  . Smokeless tobacco: Never Used  Substance Use Topics  . Alcohol use: No    Current Meds  Medication Sig  . aspirin EC 81 MG tablet Take 1 tablet (81 mg total) by mouth daily.  . Coenzyme Q10 (CO Q-10) 300 MG CAPS Take 1 capsule by mouth daily.  . Cyanocobalamin (B-12 PO) Take 1 tablet by mouth daily.   Marland Kitchen ENTRESTO 97-103 MG TAKE 1 TABLET BY MOUTH TWICE DAILY  . GARLIC PO Take 1 tablet by mouth daily.  Marland Kitchen MAGNESIUM PO Take 500 mg by mouth daily.   . metoprolol succinate (TOPROL-XL) 25 MG 24 hr tablet TAKE 1 TABLET(25 MG) BY MOUTH DAILY  . Multiple Vitamins-Minerals (MULTIVITAMIN PO) Take 1 tablet by mouth daily.   . nitroGLYCERIN (NITROSTAT) 0.4 MG SL tablet PLACE 1 TABLET UNDER THE TONGUE EVERY 5 (FIVE) MINUTES X 3  DOSES AS NEEDED FOR CHEST PAIN.  Marland Kitchen pantoprazole (PROTONIX) 40 MG tablet Take 40 mg by mouth daily as needed.  . TURMERIC PO Take 500 mg by mouth daily.   . vitamin C (ASCORBIC ACID) 500 MG tablet Take 500 mg by mouth daily.  Marland Kitchen VITAMIN E PO Take 400 mg by mouth daily.        Objective:   Today's Vitals: BP 132/78 (BP Location: Right Arm, Patient Position: Sitting, Cuff Size: Normal)   Pulse 63   Temp (!) 97.3 F (36.3 C) (Temporal)   Resp 18   Ht 5\' 7"  (1.702 m)   Wt 205 lb (93 kg)   SpO2 97% Comment: wearing mask.  BMI 32.11 kg/m  Vitals with BMI 06/30/2019 11/25/2018 04/30/2018  Height 5\' 7"  5\' 7"  5\' 7"   Weight 205 lbs 201 lbs 13  oz 205 lbs 3 oz  BMI 32.1 [id number] 0000000  Systolic Q000111Q [id number]  Diastolic 78 80 82  Pulse 63 61 77     Physical Exam  He looks systemically well. Blood pressure reasonable. He is obese. There is a mole that measures less than 0.5 cm in diameter but is raised on the left upper back. It is black.     Assessment   1. Essential hypertension   2. Mixed hyperlipidemia   3. Chronic systolic CHF (congestive heart failure) ([place])   4. Coronary artery disease involving native coronary artery of native heart without angina pectoris   5. Colon cancer screening   6. Encounter for hepatitis C screening test for low risk patient   7. Diabetes mellitus without complication ([place])   8. Special screening for malignant neoplasm of prostate   9. Vitamin D deficiency disease   10. Erectile dysfunction, unspecified erectile dysfunction type   11. Skin mole       Tests ordered Orders Placed This Encounter  Procedures  . Hepatitis C antibody  . CBC  . COMPLETE METABOLIC PANEL WITH GFR  . Hemoglobin A1c  . Lipid panel  . T3, free  . T4  . TSH  . PSA  . VITAMIN D 25 Hydroxy (Vit-D Deficiency, Fractures)  . Testosterone Total,Free,Bio, Males  . Ambulatory referral to Gastroenterology  . Ambulatory referral to Dermatology     Plan: 1. Blood work is ordered above. 2. I will refer him to gastroenterology for colonoscopy. 3. I will refer him to dermatology for the skin mole that I have seen and he is concerned about. 4. I will see him in a few weeks time to review all his blood work and do an annual physical exam at that point. 5. I spent 40 min with this patient today.   No orders of the defined types were placed in this encounter.   Doree Albee, MD

## 2019-07-02 LAB — TSH: TSH: 1.02 mIU/L (ref 0.40–4.50)

## 2019-07-02 LAB — HEMOGLOBIN A1C
Hgb A1c MFr Bld: 8.1 % of total Hgb — ABNORMAL HIGH (ref <5.7)
Mean Plasma Glucose: 186 (calc)
eAG (mmol/L): 10.3 (calc)

## 2019-07-02 LAB — TESTOSTERONE TOTAL,FREE,BIO, MALES
Albumin: 4.3 g/dL (ref 3.6–5.1)
Sex Hormone Binding: 42 nmol/L (ref 22–77)
Testosterone, Bioavailable: 97.2 ng/dL — ABNORMAL LOW (ref >=110.0)
Testosterone, Free: 49.3 pg/mL (ref 46.0–224.0)
Testosterone: 451 ng/dL (ref 250–827)

## 2019-07-02 LAB — COMPLETE METABOLIC PANEL WITH GFR
AG Ratio: 1.6 (calc) (ref 1.0–2.5)
ALT: 22 U/L (ref 9–46)
AST: 23 U/L (ref 10–35)
Albumin: 4.3 g/dL (ref 3.6–5.1)
Alkaline phosphatase (APISO): 76 U/L (ref 35–144)
BUN: 14 mg/dL (ref 7–25)
CO2: 23 mmol/L (ref 20–32)
Calcium: 9.7 mg/dL (ref 8.6–10.3)
Chloride: 104 mmol/L (ref 98–110)
Creat: 1.02 mg/dL (ref 0.70–1.25)
GFR, Est African American: 90 mL/min/{1.73_m2} (ref >=60)
GFR, Est Non African American: 77 mL/min/{1.73_m2} (ref >=60)
Globulin: 2.7 g/dL (calc) (ref 1.9–3.7)
Glucose, Bld: 139 mg/dL — ABNORMAL HIGH (ref 65–99)
Potassium: 4.8 mmol/L (ref 3.5–5.3)
Sodium: 138 mmol/L (ref 135–146)
Total Bilirubin: 0.4 mg/dL (ref 0.2–1.2)
Total Protein: 7 g/dL (ref 6.1–8.1)

## 2019-07-02 LAB — HEPATITIS C ANTIBODY
Hepatitis C Ab: NONREACTIVE
SIGNAL TO CUT-OFF: 0.07 (ref <1.00)

## 2019-07-02 LAB — CBC
HCT: 47.2 % (ref 38.5–50.0)
Hemoglobin: 15.9 g/dL (ref 13.2–17.1)
MCH: 31.3 pg (ref 27.0–33.0)
MCHC: 33.7 g/dL (ref 32.0–36.0)
MCV: 92.9 fL (ref 80.0–100.0)
MPV: 10.5 fL (ref 7.5–12.5)
Platelets: 315 10*3/uL (ref 140–400)
RBC: 5.08 10*6/uL (ref 4.20–5.80)
RDW: 13.1 % (ref 11.0–15.0)
WBC: 6.1 10*3/uL (ref 3.8–10.8)

## 2019-07-02 LAB — T4: T4, Total: 7.3 ug/dL (ref 4.9–10.5)

## 2019-07-02 LAB — T3, FREE: T3, Free: 3.7 pg/mL (ref 2.3–4.2)

## 2019-07-02 LAB — LIPID PANEL
Cholesterol: 207 mg/dL — ABNORMAL HIGH (ref <200)
HDL: 42 mg/dL (ref >=40)
LDL Cholesterol (Calc): 110 mg/dL (calc) — ABNORMAL HIGH
Non-HDL Cholesterol (Calc): 165 mg/dL (calc) — ABNORMAL HIGH (ref <130)
Total CHOL/HDL Ratio: 4.9 (calc) (ref <5.0)
Triglycerides: 386 mg/dL — ABNORMAL HIGH (ref <150)

## 2019-07-02 LAB — PSA: PSA: 0.5 ng/mL (ref <=4.0)

## 2019-07-02 LAB — VITAMIN D 25 HYDROXY (VIT D DEFICIENCY, FRACTURES): Vit D, 25-Hydroxy: 14 ng/mL — ABNORMAL LOW (ref 30–100)

## 2019-07-09 ENCOUNTER — Ambulatory Visit: Payer: BC Managed Care – PPO | Admitting: Interventional Cardiology

## 2019-07-09 NOTE — Progress Notes (Signed)
Cardiology Office Note:    Date:  07/10/2019   ID:  Scott Roth, DOB 1955/04/27, MRN [id number]  PCP:  Doree Albee, MD  Cardiologist:  Sinclair Grooms, MD   Referring MD: Manfred Shirts, [place]   Chief Complaint  Patient presents with  . Coronary Artery Disease  . Congestive Heart Failure    History of Present Illness:    Scott Roth is a 64 y.o. male with a hx of  CAD,s/p CABG in 2006, subsequent PCI with DES to the LAD/D1 in [id number], systolic CHF,hypertension,hyperlipidemia,OSA not on CPAP.Nuclear stress test 4/19 depressed LVF andcardiac cath demonstrated  patent L-LAD, patent RIMA-OM1, occluded S-Dx and an occluded S-OM2, and now stable on medical therapy.   No shortness of breath or angina.  No lower extremity swelling.  Denies palpitations.  Not exercising.  Has been gaining weight.  Has been limited somewhat by lower back discomfort.  Not following a heart healthy/diabetes friendly diet.  Not using CPAP.  Compliant with medication regimen.  Past Medical History:  Diagnosis Date  . Arthritis   . CAD (coronary artery disease)    a. CABG x4 with LIMA to dLAD, free RIMA to OM1, SVG to D1, SVG to OM2 in 2006. b. PCI 2008 with notable ventricular fibrillation during cath) - DES to proxLAD/1stdiagonal. c. Cath 07/25/17 demonstrated previously placed stents, patent LIMA-LAD, patent free RIMA-OM, occluded VG-diagonal and occluded Y graft-OM2, without significant change from prior, elevated LVEDP.  . Cardiomyopathy ([place])    EF prev normal 2004, 2008 // EF 35% by cath in 07/2017 // Echo 8/19:  Mild conc LVH, EF 40-45, diff HK, Gr 1 DD, mild AI, mild MR, mod LAE, normal RVSF, mild RAE, mild TR  . Chronic systolic CHF (congestive heart failure) ([place])   . Erectile dysfunction   . GERD (gastroesophageal reflux disease)   . Hyperlipidemia   . Hypertension   . Sleep apnea    does not wear mask  . Tubular adenoma     Past Surgical History:  Procedure  Laterality Date  . 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 CORONARY ARTERY BYPASS GRAFT  2008  . EYE SURGERY Bilateral    Lasik  . KNEE ARTHROPLASTY Left   . LEFT HEART CATH AND CORS/GRAFTS ANGIOGRAPHY N/A 07/25/2017   Procedure: LEFT HEART CATH AND CORS/GRAFTS ANGIOGRAPHY;  Surgeon: Lorretta Harp, MD;  Location: [facility];  Service: Cardiovascular;  Laterality: N/A;  . SHOULDER SURGERY Right   . TOTAL KNEE ARTHROPLASTY Right 08/25/2014  . TOTAL KNEE ARTHROPLASTY Right 08/25/2014   Procedure: RIGHT TOTAL KNEE ARTHROPLASTY;  Surgeon: Kathryne Hitch, MD;  Location: [facility];  Service: Orthopedics;  Laterality: Right;    Current Medications: Current Meds  Medication Sig  . aspirin EC 81 MG tablet Take 1 tablet (81 mg total) by mouth daily.  . Coenzyme Q10 (CO Q-10) 300 MG CAPS Take 1 capsule by mouth daily.  . Cyanocobalamin (B-12 PO) Take 1 tablet by mouth daily.   Marland Kitchen ENTRESTO 97-103 MG TAKE 1 TABLET BY MOUTH TWICE DAILY  . GARLIC PO Take 1 tablet by mouth daily.  Marland Kitchen MAGNESIUM PO Take 500 mg by mouth daily.   . metoprolol succinate (TOPROL-XL) 25 MG 24 hr tablet TAKE 1 TABLET(25 MG) BY MOUTH DAILY  . Multiple Vitamins-Minerals (MULTIVITAMIN PO) Take 1 tablet by mouth daily.   . nitroGLYCERIN (NITROSTAT) 0.4 MG  SL tablet PLACE 1 TABLET UNDER THE TONGUE EVERY 5 (FIVE) MINUTES X 3 DOSES AS NEEDED FOR CHEST PAIN.  Marland Kitchen pantoprazole (PROTONIX) 40 MG tablet Take 40 mg by mouth daily as needed.  . TURMERIC PO Take 500 mg by mouth daily.   . vitamin C (ASCORBIC ACID) 500 MG tablet Take 500 mg by mouth daily.  Marland Kitchen VITAMIN E PO Take 400 mg by mouth daily.   . [DISCONTINUED] nitroGLYCERIN (NITROSTAT) 0.4 MG SL tablet PLACE 1 TABLET UNDER THE TONGUE EVERY 5 (FIVE) MINUTES X 3 DOSES AS NEEDED FOR CHEST PAIN.     Allergies:   Penicillins   Social History   Socioeconomic History  . Marital status: Married    Spouse name: Not on file  . Number of  children: Not on file  . Years of education: Not on file  . Highest education level: Not on file  Occupational History  . Not on file  Tobacco Use  . Smoking status: Never Smoker  . Smokeless tobacco: Never Used  Substance and Sexual Activity  . Alcohol use: No  . Drug use: No  . Sexual activity: Yes    Partners: Female  Other Topics Concern  . Not on file  Social History Narrative   Married for 2 years.Lives with wife and step daughter.Originally born [place].Retired,business entrepeuer-owns several companies.   Social Determinants of Health   Financial Resource Strain:   . Difficulty of Paying Living Expenses:   Food Insecurity:   . Worried About Charity fundraiser in the Last Year:   . Arboriculturist in the Last Year:   Transportation Needs:   . Film/video editor (Medical):   Marland Kitchen Lack of Transportation (Non-Medical):   Physical Activity:   . Days of Exercise per Week:   . Minutes of Exercise per Session:   Stress:   . Feeling of Stress :   Social Connections:   . Frequency of Communication with Friends and Family:   . Frequency of Social Gatherings with Friends and Family:   . Attends Religious Services:   . Active Member of Clubs or Organizations:   . Attends Archivist Meetings:   Marland Kitchen Marital Status:      Family History: The patient's family history includes Heart failure in his father; Hypertension in his mother.  ROS:   Please see the history of present illness.    Feels sleepy all the time.  He is not wearing CPAP.  He is not dieting.  Has associated with Dr. Hurshel Party as his new primary physician.  Recent laboratory data or uncontrolled with A1c of 8.1.  All other systems reviewed and are negative.  EKGs/Labs/Other Studies Reviewed:    The following studies were reviewed today: No new cardiac evaluation or data.  2D Doppler echocardiogram 2020:  Study Conclusions   - Left ventricle: The cavity size was normal. There was  mild  concentric hypertrophy. Systolic function was mildly to  moderately reduced. The estimated ejection fraction was in the  range of 40% to 45%. Diffuse hypokinesis. Doppler parameters are  consistent with abnormal left ventricular relaxation (grade 1  diastolic dysfunction). There was no evidence of elevated  ventricular filling pressure by Doppler parameters.  - Aortic valve: There was mild regurgitation.  - Mitral valve: There was mild regurgitation.  - Left atrium: The atrium was moderately dilated.  - Right ventricle: Systolic function was normal.  - Right atrium: The atrium was mildly dilated.  -  Tricuspid valve: There was mild regurgitation.  - Pulmonary arteries: Systolic pressure was within the normal  range.  - Inferior vena cava: The vessel was normal in size.  EKG:  EKG not repeated for today's visit.  Recent Labs: 08/13/2018: NT-Pro BNP 58 06/30/2019: ALT 22; BUN 14; Creat 1.02; Hemoglobin 15.9; Platelets 315; Potassium 4.8; Sodium 138; TSH 1.02  Recent Lipid Panel    Component Value Date/Time   CHOL 207 (H) 06/30/2019 1345   CHOL 114 01/08/2019 0807   TRIG 386 (H) 06/30/2019 1345   HDL 42 06/30/2019 1345   HDL 42 01/08/2019 0807   CHOLHDL 4.9 06/30/2019 1345   VLDL 21 07/24/2017 1924   LDLCALC 110 (H) 06/30/2019 1345    Physical Exam:    VS:  BP 122/68   Pulse 77   Ht 5\' 7"  (1.702 m)   Wt 203 lb 12.8 oz (92.4 kg)   SpO2 97%   BMI 31.92 kg/m     Wt Readings from Last 3 Encounters:  07/10/19 203 lb 12.8 oz (92.4 kg)  06/30/19 205 lb (93 kg)  11/25/18 201 lb 12.8 oz (91.5 kg)     GEN: Moderate obesity. No acute distress HEENT: Normal NECK: No JVD. LYMPHATICS: No lymphadenopathy CARDIAC:  RRR without murmur, gallop, or edema. VASCULAR:  Normal Pulses. No bruits. RESPIRATORY:  Clear to auscultation without rales, wheezing or rhonchi  ABDOMEN: Soft, non-tender, non-distended, No pulsatile mass, MUSCULOSKELETAL: No deformity  SKIN: Warm  and dry NEUROLOGIC:  Alert and oriented x 3 PSYCHIATRIC:  Normal affect   ASSESSMENT:    1. Coronary artery disease involving coronary bypass graft of native heart without angina pectoris   2. Chronic systolic CHF (congestive heart failure) ([place])   3. Essential hypertension, benign   4. Hyperlipidemia, unspecified hyperlipidemia type   5. Type 2 diabetes mellitus with other circulatory complication, without long-term current use of insulin ([place])   6. Sleep apnea, unspecified type   7. Educated about COVID-19 virus infection    PLAN:    In order of problems listed above:  1. Secondary prevention covered in detail.  Attention to sleep apnea, diet, moderate physical activity, discussed in detail. 2. Continue Entresto and carvedilol.  No volume overload on clinical exam. 3. Current blood pressure control with target 130/80 mmHg. 4. LDL target is less than 70. 5. Hemoglobin A1c was 8.1.  Diet and initiation of SGLT2 therapy would be my recommendation.  We will send this note to Dr. Anastasio Champion. 6. Resume CPAP 7. Covid vaccine has been received.  Social distancing is being practiced.  Overall education and awareness concerning primary/secondary risk prevention was discussed in detail: LDL less than 70, hemoglobin A1c less than 7, blood pressure target less than 130/80 mmHg, >150 minutes of moderate aerobic activity per week, avoidance of smoking, weight control (via diet and exercise), and continued surveillance/management of/for obstructive sleep apnea.    Medication Adjustments/Labs and Tests Ordered: Current medicines are reviewed at length with the patient today.  Concerns regarding medicines are outlined above.  No orders of the defined types were placed in this encounter.  Meds ordered this encounter  Medications  . nitroGLYCERIN (NITROSTAT) 0.4 MG SL tablet    Sig: PLACE 1 TABLET UNDER THE TONGUE EVERY 5 (FIVE) MINUTES X 3 DOSES AS NEEDED FOR CHEST PAIN.    Dispense:  25 tablet     Refill:  3    Patient Instructions  Medication Instructions:  Your physician recommends that you continue on your  current medications as directed. Please refer to the Current Medication list given to you today.  *If you need a refill on your cardiac medications before your next appointment, please call your pharmacy*   Lab Work: None If you have labs (blood work) drawn today and your tests are completely normal, you will receive your results only by: Marland Kitchen MyChart Message (if you have MyChart) OR . A paper copy in the mail If you have any lab test that is abnormal or we need to change your treatment, we will call you to review the results.   Testing/Procedures: None   Follow-Up: At [facility], you and your health needs are our priority.  As part of our continuing mission to provide you with exceptional heart care, we have created designated Provider Care Teams.  These Care Teams include your primary Cardiologist (physician) and Advanced Practice Providers (APPs -  Physician Assistants and Nurse Practitioners) who all work together to provide you with the care you need, when you need it.  We recommend signing up for the patient portal called "MyChart".  Sign up information is provided on this After Visit Summary.  MyChart is used to connect with patients for Virtual Visits (Telemedicine).  Patients are able to view lab/test results, encounter notes, upcoming appointments, etc.  Non-urgent messages can be sent to your provider as well.   To learn more about what you can do with MyChart, go to [email or web address].    Your next appointment:   6 month(s)  The format for your next appointment:   In Person  Provider:   You may see Sinclair Grooms, MD or one of the following Advanced Practice Providers on your designated Care Team:    Truitt Merle, NP  Cecilie Kicks, NP  Kathyrn Drown, NP    Other Instructions  Your provider recommends that you maintain 150 minutes per  week of moderate aerobic activity.      Signed, Sinclair Grooms, MD  07/10/2019 11:32 AM    [place]

## 2019-07-10 ENCOUNTER — Encounter: Payer: Self-pay | Admitting: Interventional Cardiology

## 2019-07-10 ENCOUNTER — Other Ambulatory Visit: Payer: Self-pay

## 2019-07-10 ENCOUNTER — Ambulatory Visit (INDEPENDENT_AMBULATORY_CARE_PROVIDER_SITE_OTHER): Payer: 59 | Admitting: Interventional Cardiology

## 2019-07-10 VITALS — BP 122/68 | HR 77 | Ht 67.0 in | Wt 203.8 lb

## 2019-07-10 DIAGNOSIS — I5022 Chronic systolic (congestive) heart failure: Secondary | ICD-10-CM | POA: Diagnosis not present

## 2019-07-10 DIAGNOSIS — I10 Essential (primary) hypertension: Secondary | ICD-10-CM | POA: Diagnosis not present

## 2019-07-10 DIAGNOSIS — E785 Hyperlipidemia, unspecified: Secondary | ICD-10-CM | POA: Diagnosis not present

## 2019-07-10 DIAGNOSIS — I25810 Atherosclerosis of coronary artery bypass graft(s) without angina pectoris: Secondary | ICD-10-CM | POA: Diagnosis not present

## 2019-07-10 DIAGNOSIS — G4730 Sleep apnea, unspecified: Secondary | ICD-10-CM

## 2019-07-10 DIAGNOSIS — Z7189 Other specified counseling: Secondary | ICD-10-CM

## 2019-07-10 DIAGNOSIS — E1159 Type 2 diabetes mellitus with other circulatory complications: Secondary | ICD-10-CM

## 2019-07-10 MED ORDER — NITROGLYCERIN 0.4 MG SL SUBL: SUBLINGUAL_TABLET | SUBLINGUAL | 3 refills | Status: AC

## 2019-07-10 NOTE — Patient Instructions (Signed)
Medication Instructions:  Your physician recommends that you continue on your current medications as directed. Please refer to the Current Medication list given to you today.  *If you need a refill on your cardiac medications before your next appointment, please call your pharmacy*   Lab Work: None If you have labs (blood work) drawn today and your tests are completely normal, you will receive your results only by: Marland Kitchen MyChart Message (if you have MyChart) OR . A paper copy in the mail If you have any lab test that is abnormal or we need to change your treatment, we will call you to review the results.   Testing/Procedures: None   Follow-Up: At [facility], you and your health needs are our priority.  As part of our continuing mission to provide you with exceptional heart care, we have created designated Provider Care Teams.  These Care Teams include your primary Cardiologist (physician) and Advanced Practice Providers (APPs -  Physician Assistants and Nurse Practitioners) who all work together to provide you with the care you need, when you need it.  We recommend signing up for the patient portal called "MyChart".  Sign up information is provided on this After Visit Summary.  MyChart is used to connect with patients for Virtual Visits (Telemedicine).  Patients are able to view lab/test results, encounter notes, upcoming appointments, etc.  Non-urgent messages can be sent to your provider as well.   To learn more about what you can do with MyChart, go to [email or web address].    Your next appointment:   6 month(s)  The format for your next appointment:   In Person  Provider:   You may see Sinclair Grooms, MD or one of the following Advanced Practice Providers on your designated Care Team:    Truitt Merle, NP  Cecilie Kicks, NP  Kathyrn Drown, NP    Other Instructions  Your provider recommends that you maintain 150 minutes per week of moderate aerobic activity.

## 2019-07-16 ENCOUNTER — Encounter (INDEPENDENT_AMBULATORY_CARE_PROVIDER_SITE_OTHER): Payer: Self-pay | Admitting: Internal Medicine

## 2019-07-16 ENCOUNTER — Ambulatory Visit (INDEPENDENT_AMBULATORY_CARE_PROVIDER_SITE_OTHER): Payer: 59 | Admitting: Internal Medicine

## 2019-07-16 ENCOUNTER — Other Ambulatory Visit: Payer: Self-pay

## 2019-07-16 VITALS — BP 130/70 | HR 76 | Temp 98.6°F | Ht 67.0 in | Wt 202.2 lb

## 2019-07-16 DIAGNOSIS — N529 Male erectile dysfunction, unspecified: Secondary | ICD-10-CM

## 2019-07-16 DIAGNOSIS — E782 Mixed hyperlipidemia: Secondary | ICD-10-CM | POA: Diagnosis not present

## 2019-07-16 DIAGNOSIS — E119 Type 2 diabetes mellitus without complications: Secondary | ICD-10-CM

## 2019-07-16 DIAGNOSIS — I10 Essential (primary) hypertension: Secondary | ICD-10-CM | POA: Diagnosis not present

## 2019-07-16 DIAGNOSIS — Z0001 Encounter for general adult medical examination with abnormal findings: Secondary | ICD-10-CM

## 2019-07-16 DIAGNOSIS — E6609 Other obesity due to excess calories: Secondary | ICD-10-CM

## 2019-07-16 DIAGNOSIS — Z6831 Body mass index (BMI) 31.0-31.9, adult: Secondary | ICD-10-CM

## 2019-07-16 MED ORDER — TESTOSTERONE CYPIONATE 200 MG/ML IM SOLN: 100.0000 mg | INTRAMUSCULAR | 0 refills | Status: DC

## 2019-07-16 NOTE — Patient Instructions (Signed)
[facility] Dietary Recommendations for Weight Loss What to Avoid . Avoid added sugars o Often added sugar can be found in processed foods such as many condiments, dry cereals, cakes, cookies, chips, crisps, crackers, candies, sweetened drinks, etc.  o Read labels and AVOID/DECREASE use of foods with the following in their ingredient list: Sugar, fructose, high fructose corn syrup, sucrose, glucose, maltose, dextrose, molasses, cane sugar, brown sugar, any type of syrup, agave nectar, etc.   . Avoid snacking in between meals . Avoid foods made with flour o If you are going to eat food made with flour, choose those made with whole-grains; and, minimize your consumption as much as is tolerable . Avoid processed foods o These foods are generally stocked in the middle of the grocery store. Focus on shopping on the perimeter of the grocery.  . Avoid Meat  o We recommend following a plant-based diet at [facility]. Thus, we recommend avoiding meat as a general rule. Consider eating beans, legumes, eggs, and/or dairy products for regular protein sources o If you plan on eating meat limit to 4 ounces of meat at a time and choose lean options such as Fish, chicken, turkey. Avoid red meat intake such as pork and/or steak What to Include . Vegetables o GREEN LEAFY VEGETABLES: Kale, spinach, mustard greens, collard greens, cabbage, broccoli, etc. o OTHER: Asparagus, cauliflower, eggplant, carrots, peas, Brussel sprouts, tomatoes, bell peppers, zucchini, beets, cucumbers, etc. . Grains, seeds, and legumes o Beans: kidney beans, black eyed peas, garbanzo beans, black beans, pinto beans, etc. o Whole, unrefined grains: brown rice, barley, bulgur, oatmeal, etc. . Healthy fats  o Avoid highly processed fats such as vegetable oil o Examples of healthy fats: avocado, olives, virgin olive oil, dark chocolate (?72% Cocoa), nuts (peanuts, almonds, walnuts, cashews, pecans, etc.) . None to Low  Intake of Animal Sources of Protein o Meat sources: chicken, turkey, salmon, tuna. Limit to 4 ounces of meat at one time. o Consider limiting dairy sources, but when choosing dairy focus on: PLAIN Greek yogurt, cottage cheese, high-protein milk . Fruit o Choose berries  When to Eat . Intermittent Fasting: o Choosing not to eat for a specific time period, but DO FOCUS ON HYDRATION when fasting o Multiple Techniques: - Time Restricted Eating: eat 3 meals in a day, each meal lasting no more than 60 minutes, no snacks between meals - 16-18 hour fast: fast for 16 to 18 hours up to 7 days a week. Often suggested to start with 2-3 nonconsecutive days per week.  . Remember the time you sleep is counted as fasting.  . Examples of eating schedule: Fast from 7:00pm-11:00am. Eat between 11:00am-7:00pm.  - 24-hour fast: fast for 24 hours up to every other day. Often suggested to start with 1 day per week . Remember the time you sleep is counted as fasting . Examples of eating schedule:  o Eating day: eat 2-3 meals on your eating day. If doing 2 meals, each meal should last no more than 90 minutes. If doing 3 meals, each meal should last no more than 60 minutes. Finish last meal by 7:00pm. o Fasting day: Fast until 7:00pm.  o IF YOU FEEL UNWELL FOR ANY REASON/IN ANY WAY WHEN FASTING, STOP FASTING BY EATING A NUTRITIOUS SNACK OR LIGHT MEAL o ALWAYS FOCUS ON HYDRATION DURING FASTS - Acceptable Hydration sources: water, broths, tea/coffee (black tea/coffee is best but using a small amount of whole-fat dairy products in coffee/tea is acceptable).  -   Poor Hydration Sources: anything with sugar or artificial sweeteners added to it  These recommendations have been developed for patients that are actively receiving medical care from either Dr. Allen Egerton or Sarah Gray, DNP, NP-C at [facility]. These recommendations are developed for patients with specific medical conditions and are not meant to be  distributed or used by others that are not actively receiving care from either provider listed above at [facility]. It is not appropriate to participate in the above eating plans without proper medical supervision.   Reference: Fung, J. The obesity code. [place]: [place]; 2016.   

## 2019-07-16 NOTE — Progress Notes (Signed)
Chief Complaint: This 64 year old man comes in for an annual physical exam and to address his chronic conditions which are described below. HPI: I saw him a couple of weeks ago and his diabetes is now controlled with a hemoglobin A1c of 8.1%.  He was seen by his cardiologist Dr. Tamala Julian few days ago and he appears to be stable from a cardiac standpoint but Dr. Tamala Julian was also concerned about his diabetes and Lifestyle modifications that need to happen. We also checked other blood work including vitamin D levels which were very low and also testosterone levels which were suboptimal.  He describes significant fatigue, erectile dysfunction and a general sense of being unwell.  Past Medical History:  Diagnosis Date  . Arthritis   . CAD (coronary artery disease)    a. CABG x4 with LIMA to dLAD, free RIMA to OM1, SVG to D1, SVG to OM2 in 2006. b. PCI 2008 with notable ventricular fibrillation during cath) - DES to proxLAD/1stdiagonal. c. Cath 07/25/17 demonstrated previously placed stents, patent LIMA-LAD, patent free RIMA-OM, occluded VG-diagonal and occluded Y graft-OM2, without significant change from prior, elevated LVEDP.  . Cardiomyopathy ([place])    EF prev normal 2004, 2008 // EF 35% by cath in 07/2017 // Echo 8/19:  Mild conc LVH, EF 40-45, diff HK, Gr 1 DD, mild AI, mild MR, mod LAE, normal RVSF, mild RAE, mild TR  . Chronic systolic CHF (congestive heart failure) ([place])   . Erectile dysfunction   . GERD (gastroesophageal reflux disease)   . Hyperlipidemia   . Hypertension   . Sleep apnea    does not wear mask  . Tubular adenoma    Past Surgical History:  Procedure Laterality Date  . 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 CORONARY ARTERY BYPASS GRAFT  2008  . EYE SURGERY Bilateral    Lasik  . KNEE ARTHROPLASTY Left   . LEFT HEART CATH AND CORS/GRAFTS ANGIOGRAPHY N/A 07/25/2017   Procedure: LEFT HEART CATH AND CORS/GRAFTS ANGIOGRAPHY;   Surgeon: Lorretta Harp, MD;  Location: [facility];  Service: Cardiovascular;  Laterality: N/A;  . SHOULDER SURGERY Right   . TOTAL KNEE ARTHROPLASTY Right 08/25/2014  . TOTAL KNEE ARTHROPLASTY Right 08/25/2014   Procedure: RIGHT TOTAL KNEE ARTHROPLASTY;  Surgeon: Kathryne Hitch, MD;  Location: [facility];  Service: Orthopedics;  Laterality: Right;     Social History   Social History Narrative   Married for 2 years.Lives with wife and step daughter.Originally born [place].Retired,business entrepeuer-owns several companies.    Social History   Tobacco Use  . Smoking status: Never Smoker  . Smokeless tobacco: Never Used  Substance Use Topics  . Alcohol use: No      Allergies:  Allergies  Allergen Reactions  . Penicillins Itching and Rash    Other reaction(s): Other (See Comments) Has patient had a PCN reaction causing immediate rash, facial/tongue/throat swelling, SOB or lightheadedness with hypotension: No Has patient had a PCN reaction causing severe rash involving mucus membranes or skin necrosis: No Has patient had a PCN reaction that required hospitalization: No Has patient had a PCN reaction occurring within the last 10 years: No If all of the above answers are "NO", then may proceed with Cephalosporin use.     Current Meds  Medication Sig  . aspirin EC 81 MG tablet Take 1 tablet (81 mg total) by mouth daily.  . Coenzyme Q10 (  CO Q-10) 300 MG CAPS Take 1 capsule by mouth daily.  . Cyanocobalamin (B-12 PO) Take 1 tablet by mouth daily.   Marland Kitchen ENTRESTO 97-103 MG TAKE 1 TABLET BY MOUTH TWICE DAILY  . GARLIC PO Take 1 tablet by mouth daily.  Marland Kitchen MAGNESIUM PO Take 500 mg by mouth daily.   . metoprolol succinate (TOPROL-XL) 25 MG 24 hr tablet TAKE 1 TABLET(25 MG) BY MOUTH DAILY  . Multiple Vitamins-Minerals (MULTIVITAMIN PO) Take 1 tablet by mouth daily.   . nitroGLYCERIN (NITROSTAT) 0.4 MG SL tablet PLACE 1 TABLET UNDER THE TONGUE EVERY 5 (FIVE) MINUTES X 3  DOSES AS NEEDED FOR CHEST PAIN.  Marland Kitchen pantoprazole (PROTONIX) 40 MG tablet Take 40 mg by mouth daily as needed.  . TURMERIC PO Take 500 mg by mouth daily.   . vitamin C (ASCORBIC ACID) 500 MG tablet Take 500 mg by mouth daily.  Marland Kitchen VITAMIN E PO Take 400 mg by mouth daily.       [id number] from the symptoms mentioned above,there are no other symptoms referable to all systems reviewed.  Physical Exam: Blood pressure 130/70, pulse 76, temperature 98.6 F (37 C), temperature source Temporal, height 5\' 7"  (1.702 m), weight 202 lb 3.2 oz (91.7 kg), SpO2 98 %. Vitals with BMI 07/16/2019 07/10/2019 06/30/2019  Height 5\' 7"  5\' 7"  5\' 7"   Weight 202 lbs 3 oz 203 lbs 13 oz 205 lbs  BMI 31.66 0000000 [id number]  Systolic [id number]  Diastolic 70 68 78  Pulse 76 77 63      He looks somewhat chronically unwell and remains obese.  Blood pressure is well controlled. General: Alert, cooperative, and appears to be the stated age.No pallor.  No jaundice.  No clubbing. Head: Normocephalic Eyes: Sclera white, pupils equal and reactive to light, red reflex x 2,  Ears: Normal bilaterally Oral cavity: Lips, mucosa, and tongue normal: Teeth and gums normal Neck: No adenopathy, supple, symmetrical, trachea midline, and thyroid does not appear enlarged Respiratory: Clear to auscultation bilaterally.No wheezing, crackles or bronchial breathing. Cardiovascular: Heart sounds are present and appear to be normal without murmurs or added sounds.  No carotid bruits.  Peripheral pulses are present and equal bilaterally.: Gastrointestinal:positive bowel sounds, no hepatosplenomegaly.  No masses felt.No tenderness. Skin: Clear, No rashes noted.No worrisome skin lesions seen. Neurological: Grossly intact without focal findings, cranial nerves II through XII intact, muscle strength equal bilaterally Musculoskeletal: No acute joint abnormalities noted.Full range of movement noted with joints. Psychiatric: Affect appropriate,  non-anxious.    Assessment  1. Diabetes mellitus without complication ([place])   2. Erectile dysfunction, unspecified erectile dysfunction type   3. Mixed hyperlipidemia   4. Essential hypertension   5. Encounter for general adult medical examination with abnormal findings   6. Class 1 obesity due to excess calories with serious comorbidity and body mass index (BMI) of 31.0 to 31.9 in adult     Tests Ordered:  No orders of the defined types were placed in this encounter.    Plan  1. As far as his diabetes is concerned, he would prefer to take a dietary approach and I tend to agree with him.  We can certainly add on medications such as SGL T2 drugs which will be cardioprotective also.  We discussed the concept of intermittent fasting combined with a plant-based diet and he is agreeable to start this as soon as possible.  I think if he can fast for 16 hours every day maintaining hydration, he will  start to do better.  We also discussed the importance of low carbohydrate consisting of vegetables, moderate protein and higher fat diet to keep insulin levels lower. 2. I discussed with him testosterone therapy at length, FDA warnings, benefits and side effects and he would like to proceed.  I will start him on testosterone cypionate injectable 100 mg intramuscular once a week.  Once he obtains the medication, he will come back to the office for education on administration. 3. I will see him back in 2 months time to see how he is doing and check his testosterone levels again. 4. Today, in addition to a preventative visit, I performed an office visit to address his conditions above.     Meds ordered this encounter  Medications  . testosterone cypionate (DEPO-TESTOSTERONE) 200 MG/ML injection    Sig: Inject 0.5 mLs (100 mg total) into the muscle every 7 (seven) days.    Dispense:  10 mL    Refill:  0     Decorian Schuenemann C Asmaa Tirpak   07/16/2019, 3:10 PM

## 2019-09-15 ENCOUNTER — Other Ambulatory Visit: Payer: Self-pay

## 2019-09-15 ENCOUNTER — Ambulatory Visit (INDEPENDENT_AMBULATORY_CARE_PROVIDER_SITE_OTHER): Payer: 59 | Admitting: Internal Medicine

## 2019-09-15 ENCOUNTER — Encounter (INDEPENDENT_AMBULATORY_CARE_PROVIDER_SITE_OTHER): Payer: Self-pay | Admitting: Internal Medicine

## 2019-09-15 ENCOUNTER — Other Ambulatory Visit (INDEPENDENT_AMBULATORY_CARE_PROVIDER_SITE_OTHER): Payer: Self-pay

## 2019-09-15 VITALS — BP 110/80 | HR 67 | Temp 97.2°F | Ht 67.0 in | Wt 194.0 lb

## 2019-09-15 DIAGNOSIS — N529 Male erectile dysfunction, unspecified: Secondary | ICD-10-CM

## 2019-09-15 DIAGNOSIS — E119 Type 2 diabetes mellitus without complications: Secondary | ICD-10-CM | POA: Diagnosis not present

## 2019-09-15 DIAGNOSIS — E669 Obesity, unspecified: Secondary | ICD-10-CM

## 2019-09-15 DIAGNOSIS — I10 Essential (primary) hypertension: Secondary | ICD-10-CM

## 2019-09-15 MED ORDER — TESTOSTERONE CYPIONATE 200 MG/ML IM SOLN: 100.0000 mg | INTRAMUSCULAR | 0 refills | Status: DC

## 2019-09-15 MED ORDER — TESTOSTERONE CYPIONATE 200 MG/ML IM SOLN: 100.0000 mg | Freq: Once | INTRAMUSCULAR | Status: DC

## 2019-09-15 NOTE — Progress Notes (Signed)
Metrics: Intervention Frequency ACO  Documented Smoking Status Yearly  Screened one or more times in 24 months  Cessation Counseling or  Active cessation medication Past 24 months  Past 24 months   Guideline developer: UpToDate (See UpToDate for funding source) Date Released: 2014       Wellness Office Visit  Subjective:  Patient ID: Scott Roth, male    DOB: 1956-01-27  Age: 64 y.o. MRN: [id number]  CC: This man comes in for follow-up of his uncontrolled diabetes, hypertension, erectile dysfunction, coronary artery disease.  HPI  He has done very well with nutrition.  He now does intermittent fasting on almost every day approximately 18 hours.  He says he eats about 1-2 meals a day.  He has begun to also exercise with karate again. Unfortunately, there was some confusion regarding his testosterone and he did not pick it up from the pharmacy on the last visit.  He picked up the needles and syringes required.  Past Medical History:  Diagnosis Date  . Arthritis   . CAD (coronary artery disease)    a. CABG x4 with LIMA to dLAD, free RIMA to OM1, SVG to D1, SVG to OM2 in 2006. b. PCI 2008 with notable ventricular fibrillation during cath) - DES to proxLAD/1stdiagonal. c. Cath 07/25/17 demonstrated previously placed stents, patent LIMA-LAD, patent free RIMA-OM, occluded VG-diagonal and occluded Y graft-OM2, without significant change from prior, elevated LVEDP.  . Cardiomyopathy ([place])    EF prev normal 2004, 2008 // EF 35% by cath in 07/2017 // Echo 8/19:  Mild conc LVH, EF 40-45, diff HK, Gr 1 DD, mild AI, mild MR, mod LAE, normal RVSF, mild RAE, mild TR  . Chronic systolic CHF (congestive heart failure) ([place])   . Erectile dysfunction   . GERD (gastroesophageal reflux disease)   . Hyperlipidemia   . Hypertension   . Sleep apnea    does not wear mask  . Tubular adenoma    Past Surgical History:  Procedure Laterality Date  . CARDIAC CATHETERIZATION     X 1 stent in 2008 due to  heart valve collapse after CABG  . CORONARY ARTERY BYPASS GRAFT  2008  . EYE SURGERY Bilateral    Lasik  . KNEE ARTHROPLASTY Left   . LEFT HEART CATH AND CORS/GRAFTS ANGIOGRAPHY N/A 07/25/2017   Procedure: LEFT HEART CATH AND CORS/GRAFTS ANGIOGRAPHY;  Surgeon: Lorretta Harp, MD;  Location: [facility];  Service: Cardiovascular;  Laterality: N/A;  . SHOULDER SURGERY Right   . TOTAL KNEE ARTHROPLASTY Right 08/25/2014  . TOTAL KNEE ARTHROPLASTY Right 08/25/2014   Procedure: RIGHT TOTAL KNEE ARTHROPLASTY;  Surgeon: Kathryne Hitch, MD;  Location: [facility];  Service: Orthopedics;  Laterality: Right;     Family History  Problem Relation Age of Onset  . Heart failure Father   . Hypertension Mother     Social History   Social History Narrative   Married for 2 years.Lives with wife and step daughter.Originally born [place].Retired,business entrepeuer-owns several companies.   Social History   Tobacco Use  . Smoking status: Never Smoker  . Smokeless tobacco: Never Used  Substance Use Topics  . Alcohol use: No    Current Meds  Medication Sig  . aspirin EC 81 MG tablet Take 1 tablet (81 mg total) by mouth daily.  . Cholecalciferol (VITAMIN D3) 125 MCG (5000 UT) CAPS Take 5,000 Units by mouth daily.  . Coenzyme Q10 (CO Q-10) 300 MG CAPS Take 1 capsule by  mouth daily.  . Cyanocobalamin (B-12 PO) Take 1 tablet by mouth daily.   Marland Kitchen ENTRESTO 97-103 MG TAKE 1 TABLET BY MOUTH TWICE DAILY  . GARLIC PO Take 1 tablet by mouth daily.  Marland Kitchen MAGNESIUM PO Take 500 mg by mouth daily.   . metoprolol succinate (TOPROL-XL) 25 MG 24 hr tablet TAKE 1 TABLET(25 MG) BY MOUTH DAILY  . Multiple Vitamins-Minerals (MULTIVITAMIN PO) Take 1 tablet by mouth daily.   . nitroGLYCERIN (NITROSTAT) 0.4 MG SL tablet PLACE 1 TABLET UNDER THE TONGUE EVERY 5 (FIVE) MINUTES X 3 DOSES AS NEEDED FOR CHEST PAIN.  Marland Kitchen pantoprazole (PROTONIX) 40 MG tablet Take 40 mg by mouth daily as needed.  . testosterone  cypionate (DEPO-TESTOSTERONE) 200 MG/ML injection Inject 0.5 mLs (100 mg total) into the muscle every 7 (seven) days.  . TURMERIC PO Take 500 mg by mouth daily.   . vitamin C (ASCORBIC ACID) 500 MG tablet Take 500 mg by mouth daily.  Marland Kitchen VITAMIN E PO Take 400 mg by mouth daily.   . [DISCONTINUED] testosterone cypionate (DEPO-TESTOSTERONE) 200 MG/ML injection Inject 0.5 mLs (100 mg total) into the muscle every 7 (seven) days.      Depression screen [facility] 2/9 07/16/2019 07/16/2019  Decreased Interest 0 0  Down, Depressed, Hopeless 0 0  PHQ - 2 Score 0 0     Objective:   Today's Vitals: BP 110/80 (BP Location: Left Arm, Patient Position: Sitting, Cuff Size: Normal)   Pulse 67   Temp (!) 97.2 F (36.2 C) (Temporal)   Ht 5\' 7"  (1.702 m)   Wt 194 lb (88 kg)   SpO2 97%   BMI 30.38 kg/m  Vitals with BMI 09/15/2019 07/16/2019 07/10/2019  Height 5\' 7"  5\' 7"  5\' 7"   Weight 194 lbs 202 lbs 3 oz 203 lbs 13 oz  BMI 30.38 [id number] 0000000  Systolic [id number]  Diastolic 80 70 68  Pulse 67 76 77     Physical Exam   He has lost weight of around 8 pounds since the last time I saw him and he feels much improved with his overall health.    Assessment   1. Diabetes mellitus without complication ([place])   2. Erectile dysfunction, unspecified erectile dysfunction type   3. Essential hypertension   4. Obesity (BMI 30-39.9)       Tests ordered No orders of the defined types were placed in this encounter.    Plan: 1. He will continue with nutrition alone to improve his diabetes. 2. As far as his erectile dysfunction is concerned, I have sent a new prescription for testosterone and hopefully he will get at this time and we can show him in the office regarding administration. 3. His hypertension is well controlled on current therapy.  He will continue with this. 4. Follow-up in 6 weeks.   Meds ordered this encounter  Medications  . testosterone cypionate (DEPO-TESTOSTERONE) 200 MG/ML injection     Sig: Inject 0.5 mLs (100 mg total) into the muscle every 7 (seven) days.    Dispense:  10 mL    Refill:  0    Arnetta Odeh Luther Parody, MD

## 2019-09-15 NOTE — Progress Notes (Signed)
Pt was trained and educated on proper injection and disposal. Pt was given 0.67ml To Rt thigh IM.The 1st injection here in office. Next week pt will start given self injection in the home. Pt was told when he is finish with needles, place in a recycle water bottle, milk jug,etc  to bring to office for disposal correctly in [place] box.

## 2019-10-12 ENCOUNTER — Other Ambulatory Visit: Payer: 59 | Admitting: *Deleted

## 2019-10-12 ENCOUNTER — Other Ambulatory Visit: Payer: Self-pay

## 2019-10-12 DIAGNOSIS — E785 Hyperlipidemia, unspecified: Secondary | ICD-10-CM

## 2019-10-12 LAB — LIPID PANEL
Chol/HDL Ratio: 2.9 ratio (ref 0.0–5.0)
Cholesterol, Total: 103 mg/dL (ref 100–199)
HDL: 36 mg/dL — ABNORMAL LOW (ref >39)
LDL Chol Calc (NIH): 47 mg/dL (ref 0–99)
Triglycerides: 106 mg/dL (ref 0–149)
VLDL Cholesterol Cal: 20 mg/dL (ref 5–40)

## 2019-10-12 LAB — HEPATIC FUNCTION PANEL
ALT: 21 IU/L (ref 0–44)
AST: 23 IU/L (ref 0–40)
Albumin: 4.4 g/dL (ref 3.8–4.8)
Alkaline Phosphatase: 57 IU/L (ref 48–121)
Bilirubin Total: 0.9 mg/dL (ref 0.0–1.2)
Bilirubin, Direct: 0.26 mg/dL (ref 0.00–0.40)
Total Protein: 7 g/dL (ref 6.0–8.5)

## 2019-10-27 ENCOUNTER — Encounter (INDEPENDENT_AMBULATORY_CARE_PROVIDER_SITE_OTHER): Payer: Self-pay | Admitting: Internal Medicine

## 2019-10-27 ENCOUNTER — Ambulatory Visit (INDEPENDENT_AMBULATORY_CARE_PROVIDER_SITE_OTHER): Payer: 59 | Admitting: Internal Medicine

## 2019-10-27 ENCOUNTER — Other Ambulatory Visit: Payer: Self-pay

## 2019-10-27 VITALS — BP 100/80 | HR 74 | Temp 96.7°F | Ht 67.0 in | Wt 193.4 lb

## 2019-10-27 DIAGNOSIS — I10 Essential (primary) hypertension: Secondary | ICD-10-CM | POA: Diagnosis not present

## 2019-10-27 DIAGNOSIS — E119 Type 2 diabetes mellitus without complications: Secondary | ICD-10-CM | POA: Diagnosis not present

## 2019-10-27 DIAGNOSIS — E669 Obesity, unspecified: Secondary | ICD-10-CM | POA: Diagnosis not present

## 2019-10-27 DIAGNOSIS — N529 Male erectile dysfunction, unspecified: Secondary | ICD-10-CM

## 2019-10-27 DIAGNOSIS — E559 Vitamin D deficiency, unspecified: Secondary | ICD-10-CM

## 2019-10-27 NOTE — Progress Notes (Signed)
Metrics: Intervention Frequency ACO  Documented Smoking Status Yearly  Screened one or more times in 24 months  Cessation Counseling or  Active cessation medication Past 24 months  Past 24 months   Guideline developer: UpToDate (See UpToDate for funding source) Date Released: 2014       Wellness Office Visit  Subjective:  Patient ID: Scott Roth, male    DOB: 1956/02/11  Age: 64 y.o. MRN: [id number]  CC: This man comes in for follow-up of diabetes, hypertension, coronary artery disease, erectile dysfunction, obesity and vitamin D deficiency. HPI  Since the last time I saw him, he started testosterone therapy and he feels he is more energized and also erectile dysfunction has improved.  He last took an injection 1 week ago. He continues to maintain intermittent fasting and tries to eat healthier.  He is on no medications for his diabetes.  His last hemoglobin A1c was just over 8%. He continues on metoprolol and Entresto for his heart issues and he describes feeling somewhat lightheaded on standing up.  I have told him to discuss this with his cardiologist. He continues to take vitamin D3 supplementation but he does not remember the dose he is taking. Past Medical History:  Diagnosis Date  . Arthritis   . CAD (coronary artery disease)    a. CABG x4 with LIMA to dLAD, free RIMA to OM1, SVG to D1, SVG to OM2 in 2006. b. PCI 2008 with notable ventricular fibrillation during cath) - DES to proxLAD/1stdiagonal. c. Cath 07/25/17 demonstrated previously placed stents, patent LIMA-LAD, patent free RIMA-OM, occluded VG-diagonal and occluded Y graft-OM2, without significant change from prior, elevated LVEDP.  . Cardiomyopathy ([place])    EF prev normal 2004, 2008 // EF 35% by cath in 07/2017 // Echo 8/19:  Mild conc LVH, EF 40-45, diff HK, Gr 1 DD, mild AI, mild MR, mod LAE, normal RVSF, mild RAE, mild TR  . Chronic systolic CHF (congestive heart failure) ([place])   . Erectile dysfunction   . GERD  (gastroesophageal reflux disease)   . Hyperlipidemia   . Hypertension   . Sleep apnea    does not wear mask  . Tubular adenoma    Past Surgical History:  Procedure Laterality Date  . 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 CORONARY ARTERY BYPASS GRAFT  2008  . EYE SURGERY Bilateral    Lasik  . KNEE ARTHROPLASTY Left   . LEFT HEART CATH AND CORS/GRAFTS ANGIOGRAPHY N/A 07/25/2017   Procedure: LEFT HEART CATH AND CORS/GRAFTS ANGIOGRAPHY;  Surgeon: Lorretta Harp, MD;  Location: [facility];  Service: Cardiovascular;  Laterality: N/A;  . SHOULDER SURGERY Right   . TOTAL KNEE ARTHROPLASTY Right 08/25/2014  . TOTAL KNEE ARTHROPLASTY Right 08/25/2014   Procedure: RIGHT TOTAL KNEE ARTHROPLASTY;  Surgeon: Kathryne Hitch, MD;  Location: [facility];  Service: Orthopedics;  Laterality: Right;     Family History  Problem Relation Age of Onset  . Heart failure Father   . Hypertension Mother     Social History   Social History Narrative   Married for 2 years.Lives with wife and step daughter.Originally born [place].Retired,business entrepeuer-owns several companies.   Social History   Tobacco Use  . Smoking status: Never Smoker  . Smokeless tobacco: Never Used  Substance Use Topics  . Alcohol use: No    Current Meds  Medication Sig  . aspirin EC 81 MG  tablet Take 1 tablet (81 mg total) by mouth daily.  . Cholecalciferol (VITAMIN D3) 125 MCG (5000 UT) CAPS Take 5,000 Units by mouth daily.  . Coenzyme Q10 (CO Q-10) 300 MG CAPS Take 1 capsule by mouth daily.  . Cyanocobalamin (B-12 PO) Take 1 tablet by mouth daily.   Marland Kitchen ENTRESTO 97-103 MG TAKE 1 TABLET BY MOUTH TWICE DAILY  . Fish Oil-Coenzyme Q10 (FISH OIL PLUS CO Q-10) 1000-30 MG CAPS Take by mouth.  Marland Kitchen GARLIC PO Take 1 tablet by mouth daily.  Marland Kitchen MAGNESIUM PO Take 500 mg by mouth daily.   . metoprolol succinate (TOPROL-XL) 25 MG 24 hr tablet TAKE 1 TABLET(25 MG) BY  MOUTH DAILY  . Multiple Vitamins-Minerals (MULTIVITAMIN PO) Take 1 tablet by mouth daily.   . nitroGLYCERIN (NITROSTAT) 0.4 MG SL tablet PLACE 1 TABLET UNDER THE TONGUE EVERY 5 (FIVE) MINUTES X 3 DOSES AS NEEDED FOR CHEST PAIN.  Marland Kitchen pantoprazole (PROTONIX) 40 MG tablet Take 40 mg by mouth daily as needed.  . testosterone cypionate (DEPO-TESTOSTERONE) 200 MG/ML injection Inject 0.5 mLs (100 mg total) into the muscle every 7 (seven) days.  . TURMERIC PO Take 500 mg by mouth daily.   . vitamin C (ASCORBIC ACID) 500 MG tablet Take 500 mg by mouth daily.  Marland Kitchen VITAMIN E PO Take 400 mg by mouth daily.    Current Facility-Administered Medications for the 10/27/19 encounter (Office Visit) with Doree Albee, MD  Medication  . testosterone cypionate (DEPOTESTOSTERONE CYPIONATE) injection 100 mg      Depression screen [facility] 2/9 07/16/2019 07/16/2019  Decreased Interest 0 0  Down, Depressed, Hopeless 0 0  PHQ - 2 Score 0 0     Objective:   Today's Vitals: BP 100/80 (BP Location: Left Arm, Patient Position: Sitting, Cuff Size: Normal)   Pulse 74   Temp (!) 96.7 F (35.9 C) (Temporal)   Ht 5\' 7"  (1.702 m)   Wt 193 lb 6.4 oz (87.7 kg)   SpO2 96%   BMI 30.29 kg/m  Vitals with BMI 10/27/2019 09/15/2019 07/16/2019  Height 5\' 7"  5\' 7"  5\' 7"   Weight 193 lbs 6 oz 194 lbs 202 lbs 3 oz  BMI 30.28 58.52 77.82  Systolic 423 536 144  Diastolic 80 80 70  Pulse 74 67 76     Physical Exam   He looks systemically well.  He has maintained his weight loss and he is about a pound lower.  His blood pressure is actually on the soft side.  Otherwise he looks well.    Assessment   1. Diabetes mellitus without complication ([place])   2. Essential hypertension   3. Obesity (BMI 30-39.9)   4. Erectile dysfunction, unspecified erectile dysfunction type   5. Vitamin D deficiency disease       Tests ordered Orders Placed This Encounter  Procedures  . Hemoglobin A1c  . Testosterone Total,Free,Bio, Males  .  COMPLETE METABOLIC PANEL WITH GFR  . VITAMIN D 25 Hydroxy (Vit-D Deficiency, Fractures)     Plan: 1. As far as his diabetes is concerned, we will check an A1c and hopefully do not need to institute any medications. 2. His hypertension is well controlled in fact, slightly soft right now and I have told him to discuss this with his cardiologist. 3. We will check testosterone levels to see if we need to optimize further or this will be a good dose for him. 4. He will continue with vitamin D3 supplementation and we will check  vitamin D levels. 5. Further recommendations will depend on blood results and I will see him for follow-up in 3 months.   No orders of the defined types were placed in this encounter.   Doree Albee, MD

## 2019-10-28 LAB — COMPLETE METABOLIC PANEL WITH GFR
AG Ratio: 1.8 (calc) (ref 1.0–2.5)
ALT: 23 U/L (ref 9–46)
AST: 32 U/L (ref 10–35)
Albumin: 4.2 g/dL (ref 3.6–5.1)
Alkaline phosphatase (APISO): 46 U/L (ref 35–144)
BUN: 14 mg/dL (ref 7–25)
CO2: 25 mmol/L (ref 20–32)
Calcium: 9.2 mg/dL (ref 8.6–10.3)
Chloride: 103 mmol/L (ref 98–110)
Creat: 0.96 mg/dL (ref 0.70–1.25)
GFR, Est African American: 96 mL/min/{1.73_m2} (ref >=60)
GFR, Est Non African American: 83 mL/min/{1.73_m2} (ref >=60)
Globulin: 2.4 g/dL (calc) (ref 1.9–3.7)
Glucose, Bld: 153 mg/dL — ABNORMAL HIGH (ref 65–99)
Potassium: 4.1 mmol/L (ref 3.5–5.3)
Sodium: 138 mmol/L (ref 135–146)
Total Bilirubin: 1.2 mg/dL (ref 0.2–1.2)
Total Protein: 6.6 g/dL (ref 6.1–8.1)

## 2019-10-28 LAB — TESTOSTERONE TOTAL,FREE,BIO, MALES
Albumin: 4.2 g/dL (ref 3.6–5.1)
Sex Hormone Binding: 40 nmol/L (ref 22–77)
Testosterone, Bioavailable: 253.7 ng/dL (ref >=110.0)
Testosterone, Free: 131.7 pg/mL (ref 46.0–224.0)
Testosterone: 977 ng/dL — ABNORMAL HIGH (ref 250–827)

## 2019-10-28 LAB — HEMOGLOBIN A1C
Hgb A1c MFr Bld: 5.5 % of total Hgb (ref <5.7)
Mean Plasma Glucose: 111 (calc)
eAG (mmol/L): 6.2 (calc)

## 2019-10-28 LAB — VITAMIN D 25 HYDROXY (VIT D DEFICIENCY, FRACTURES): Vit D, 25-Hydroxy: 32 ng/mL (ref 30–100)

## 2020-01-27 ENCOUNTER — Encounter (INDEPENDENT_AMBULATORY_CARE_PROVIDER_SITE_OTHER): Payer: Self-pay | Admitting: Internal Medicine

## 2020-01-27 ENCOUNTER — Ambulatory Visit (INDEPENDENT_AMBULATORY_CARE_PROVIDER_SITE_OTHER): Payer: 59 | Admitting: Internal Medicine

## 2020-01-27 ENCOUNTER — Other Ambulatory Visit: Payer: Self-pay

## 2020-01-27 VITALS — BP 121/80 | HR 71 | Temp 97.7°F | Resp 18 | Ht 67.0 in | Wt 194.0 lb

## 2020-01-27 DIAGNOSIS — E119 Type 2 diabetes mellitus without complications: Secondary | ICD-10-CM | POA: Diagnosis not present

## 2020-01-27 DIAGNOSIS — E669 Obesity, unspecified: Secondary | ICD-10-CM

## 2020-01-27 DIAGNOSIS — I10 Essential (primary) hypertension: Secondary | ICD-10-CM | POA: Diagnosis not present

## 2020-01-27 DIAGNOSIS — N529 Male erectile dysfunction, unspecified: Secondary | ICD-10-CM

## 2020-01-27 DIAGNOSIS — E559 Vitamin D deficiency, unspecified: Secondary | ICD-10-CM

## 2020-01-27 MED ORDER — TESTOSTERONE CYPIONATE 200 MG/ML IM SOLN: 100.0000 mg | INTRAMUSCULAR | 1 refills | Status: DC

## 2020-01-27 NOTE — Progress Notes (Signed)
Metrics: Intervention Frequency ACO  Documented Smoking Status Yearly  Screened one or more times in 24 months  Cessation Counseling or  Active cessation medication Past 24 months  Past 24 months   Guideline developer: UpToDate (See UpToDate for funding source) Date Released: 2014       Wellness Office Visit  Subjective:  Patient ID: Scott Roth, male    DOB: [birth year]  Age: 64 y.o. MRN: [id number]  CC: This man comes in for follow-up of diabetes, hypertension, obesity, erectile dysfunction and testosterone therapy.  He also has vitamin D deficiency. HPI  Overall, he is doing reasonably well, he has more energy than he ever did when he first came to see me.  However, he does complain of recent hair loss which seems to be fairly rapid but he is not sure about this on closer questioning. His last hemoglobin A1c was in a nondiabetic range of 5.5%.  He continues to try to watch nutrition and has begun to exercise with karate. He follows with his cardiologist as scheduled. Past Medical History:  Diagnosis Date  . Arthritis   . CAD (coronary artery disease)    a. CABG x4 with LIMA to dLAD, free RIMA to OM1, SVG to D1, SVG to OM2 in 2006. b. PCI 2008 with notable ventricular fibrillation during cath) - DES to proxLAD/1stdiagonal. c. Cath 07/25/17 demonstrated previously placed stents, patent LIMA-LAD, patent free RIMA-OM, occluded VG-diagonal and occluded Y graft-OM2, without significant change from prior, elevated LVEDP.  . Cardiomyopathy ([place])    EF prev normal 2004, 2008 // EF 35% by cath in 07/2017 // Echo 8/19:  Mild conc LVH, EF 40-45, diff HK, Gr 1 DD, mild AI, mild MR, mod LAE, normal RVSF, mild RAE, mild TR  . Chronic systolic CHF (congestive heart failure) ([place])   . Erectile dysfunction   . GERD (gastroesophageal reflux disease)   . Hyperlipidemia   . Hypertension   . Sleep apnea    does not wear mask  . Tubular adenoma    Past Surgical History:  Procedure Laterality  Date  . 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 CORONARY ARTERY BYPASS GRAFT  2008  . EYE SURGERY Bilateral    Lasik  . KNEE ARTHROPLASTY Left   . LEFT HEART CATH AND CORS/GRAFTS ANGIOGRAPHY N/A 07/25/2017   Procedure: LEFT HEART CATH AND CORS/GRAFTS ANGIOGRAPHY;  Surgeon: Lorretta Harp, MD;  Location: [facility];  Service: Cardiovascular;  Laterality: N/A;  . SHOULDER SURGERY Right   . TOTAL KNEE ARTHROPLASTY Right 08/25/2014  . TOTAL KNEE ARTHROPLASTY Right 08/25/2014   Procedure: RIGHT TOTAL KNEE ARTHROPLASTY;  Surgeon: Kathryne Hitch, MD;  Location: [place];  Service: Orthopedics;  Laterality: Right;     Family History  Problem Relation Age of Onset  . Heart failure Father   . Hypertension Mother     Social History   Social History Narrative   Married for 2 years.Lives with wife and step daughter.Originally born [place].Retired,business entrepeuer-owns several companies.   Social History   Tobacco Use  . Smoking status: Never Smoker  . Smokeless tobacco: Never Used  Substance Use Topics  . Alcohol use: No    Current Meds  Medication Sig  . aspirin EC 81 MG tablet Take 1 tablet (81 mg total) by mouth daily.  . Cholecalciferol (VITAMIN D3) 125 MCG (5000 UT) CAPS Take 10,000 Units by mouth daily.   Marland Kitchen  Coenzyme Q10 (CO Q-10) 300 MG CAPS Take 1 capsule by mouth daily.  . Cyanocobalamin (B-12 PO) Take 1 tablet by mouth daily.   Marland Kitchen ENTRESTO 97-103 MG TAKE 1 TABLET BY MOUTH TWICE DAILY  . Fish Oil-Coenzyme Q10 (FISH OIL PLUS CO Q-10) 1000-30 MG CAPS Take by mouth.  Marland Kitchen GARLIC PO Take 1 tablet by mouth daily.  Marland Kitchen MAGNESIUM PO Take 500 mg by mouth daily.   . metoprolol succinate (TOPROL-XL) 25 MG 24 hr tablet TAKE 1 TABLET(25 MG) BY MOUTH DAILY  . Multiple Vitamins-Minerals (MULTIVITAMIN PO) Take 1 tablet by mouth daily.   . nitroGLYCERIN (NITROSTAT) 0.4 MG SL tablet PLACE 1 TABLET UNDER THE TONGUE EVERY 5 (FIVE)  MINUTES X 3 DOSES AS NEEDED FOR CHEST PAIN.  Marland Kitchen pantoprazole (PROTONIX) 40 MG tablet Take 40 mg by mouth daily as needed.  . testosterone cypionate (DEPO-TESTOSTERONE) 200 MG/ML injection Inject 0.5 mLs (100 mg total) into the muscle every 7 (seven) days.  . TURMERIC PO Take 500 mg by mouth daily.   . vitamin C (ASCORBIC ACID) 500 MG tablet Take 500 mg by mouth daily.  Marland Kitchen VITAMIN E PO Take 400 mg by mouth daily.   . [DISCONTINUED] testosterone cypionate (DEPO-TESTOSTERONE) 200 MG/ML injection Inject 0.5 mLs (100 mg total) into the muscle every 7 (seven) days.      Depression screen [facility] 2/9 07/16/2019 07/16/2019  Decreased Interest 0 0  Down, Depressed, Hopeless 0 0  PHQ - 2 Score 0 0     Objective:   Today's Vitals: BP 121/80   Pulse 71   Temp 97.7 F (36.5 C) (Temporal)   Resp 18   Ht 5\' 7"  (1.702 m)   Wt 194 lb (88 kg)   SpO2 98%   BMI 30.38 kg/m  Vitals with BMI 01/27/2020 10/27/2019 09/15/2019  Height 5\' 7"  5\' 7"  5\' 7"   Weight 194 lbs 193 lbs 6 oz 194 lbs  BMI 30.38 95.63 87.56  Systolic 433 295 188  Diastolic 80 80 80  Pulse 71 74 67     Physical Exam   He remains obese and his weight is stable.  Blood pressure is in good range.    Assessment   1. Diabetes mellitus without complication ([place])   2. Essential hypertension   3. Obesity (BMI 30-39.9)   4. Erectile dysfunction, unspecified erectile dysfunction type   5. Vitamin D deficiency disease       Tests ordered Orders Placed This Encounter  Procedures  . Hemoglobin A1c  . COMPLETE METABOLIC PANEL WITH GFR  . VITAMIN D 25 Hydroxy (Vit-D Deficiency, Fractures)     Plan: 1. As far as his hair loss is concerned, I told him that this may be a possible side effect of testosterone.  After full discussion, he is willing to continue testosterone for the time being.  We could try him on other medications to help with the hair loss but he did not want to entertain this at the present time. 2. We will check an A1c  to see what his diabetic control is at the present time. 3. He will continue with testosterone therapy as before and I have refilled this now. 4. Further recommendations will depend on blood results and I will see him for follow-up in about 6 months time.   Meds ordered this encounter  Medications  . testosterone cypionate (DEPO-TESTOSTERONE) 200 MG/ML injection    Sig: Inject 0.5 mLs (100 mg total) into the muscle every 7 (seven) days.  Dispense:  10 mL    Refill:  1    Williard Keller Luther Parody, MD

## 2020-01-28 LAB — COMPLETE METABOLIC PANEL WITH GFR
AG Ratio: 1.6 (calc) (ref 1.0–2.5)
ALT: 22 U/L (ref 9–46)
AST: 21 U/L (ref 10–35)
Albumin: 4.1 g/dL (ref 3.6–5.1)
Alkaline phosphatase (APISO): 52 U/L (ref 35–144)
BUN: 14 mg/dL (ref 7–25)
CO2: 27 mmol/L (ref 20–32)
Calcium: 9.1 mg/dL (ref 8.6–10.3)
Chloride: 104 mmol/L (ref 98–110)
Creat: 0.95 mg/dL (ref 0.70–1.25)
GFR, Est African American: 98 mL/min/{1.73_m2} (ref >=60)
GFR, Est Non African American: 84 mL/min/{1.73_m2} (ref >=60)
Globulin: 2.5 g/dL (calc) (ref 1.9–3.7)
Glucose, Bld: 147 mg/dL — ABNORMAL HIGH (ref 65–99)
Potassium: 4.9 mmol/L (ref 3.5–5.3)
Sodium: 139 mmol/L (ref 135–146)
Total Bilirubin: 1.2 mg/dL (ref 0.2–1.2)
Total Protein: 6.6 g/dL (ref 6.1–8.1)

## 2020-01-28 LAB — HEMOGLOBIN A1C
Hgb A1c MFr Bld: 6.2 % of total Hgb — ABNORMAL HIGH (ref <5.7)
Mean Plasma Glucose: 131 (calc)
eAG (mmol/L): 7.3 (calc)

## 2020-01-28 LAB — VITAMIN D 25 HYDROXY (VIT D DEFICIENCY, FRACTURES): Vit D, 25-Hydroxy: 37 ng/mL (ref 30–100)

## 2020-02-08 NOTE — Progress Notes (Signed)
Cardiology Office Note   Date:  02/09/2020   ID:  Scott Roth, DOB Feb 22, 1956, MRN [id number]  PCP:  Doree Albee, MD  Cardiologist: Dr. Tamala Julian, MD    Chief Complaint  Patient presents with  . Follow-up    History of Present Illness: Scott Roth is a 64 y.o. male who presents for 41-month follow-up, seen for Dr. Tamala Julian.  [name] has a history of CAD s/p CABG in 2006 with subsequent PCI with DES to the LAD/D1 in 7124, chronic systolic CHF, HTN, HLD, OSA not on CPAP.  He underwent nuclear stress testing 07/2017 which showed depressed LVEF therefore LHC was pursued which showed patent LIMA to LAD, patent RIMA to OM1, occluded SVG to DX and occluded SVG to OM 2>> stable on medical therapy.  Echocardiogram 12/06/2017 with LVEF at 40 to 45% with G1 DD, diffuse hypokinesis, mild AR, mild MR.  He was most recently seen 07/10/2019 in follow-up at which time he was doing relatively well from a CV standpoint.  He has been gaining weight and felt his exercise capacity was limited by back discomfort.   Today he states that he is doing well with no specific complaints. 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 He denies chest pain, LE edema, orthopnea, PND, dizziness or syncope. He has been compliant with his medication. Discussed following his echo prior to next OV with Dr. Tamala Julian.    Past Medical History:  Diagnosis Date  . Arthritis   . CAD (coronary artery disease)    a. CABG x4 with LIMA to dLAD, free RIMA to OM1, SVG to D1, SVG to OM2 in 2006. b. PCI 2008 with notable ventricular fibrillation during cath) - DES to proxLAD/1stdiagonal. c. Cath 07/25/17 demonstrated previously placed stents, patent LIMA-LAD, patent free RIMA-OM, occluded VG-diagonal and occluded Y graft-OM2, without significant change from prior, elevated LVEDP.  . Cardiomyopathy ([place])    EF prev normal 2004, 2008 // EF 35% by cath in 07/2017 // Echo  8/19:  Mild conc LVH, EF 40-45, diff HK, Gr 1 DD, mild AI, mild MR, mod LAE, normal RVSF, mild RAE, mild TR  . Chronic systolic CHF (congestive heart failure) ([place])   . Erectile dysfunction   . GERD (gastroesophageal reflux disease)   . Hyperlipidemia   . Hypertension   . Sleep apnea    does not wear mask  . Tubular adenoma     Past Surgical History:  Procedure Laterality Date  . 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 CORONARY ARTERY BYPASS GRAFT  2008  . EYE SURGERY Bilateral    Lasik  . KNEE ARTHROPLASTY Left   . LEFT HEART CATH AND CORS/GRAFTS ANGIOGRAPHY N/A 07/25/2017   Procedure: LEFT HEART CATH AND CORS/GRAFTS ANGIOGRAPHY;  Surgeon: Lorretta Harp, MD;  Location: [place] [facility];  Service: Cardiovascular;  Laterality: N/A;  . SHOULDER SURGERY Right   . TOTAL KNEE ARTHROPLASTY Right 08/25/2014  . TOTAL KNEE ARTHROPLASTY Right 08/25/2014   Procedure: RIGHT TOTAL KNEE ARTHROPLASTY;  Surgeon: Kathryne Hitch, MD;  Location: [place];  Service: Orthopedics;  Laterality: Right;     Current Outpatient Medications  Medication Sig Dispense Refill  . aspirin EC 81 MG tablet Take 1 tablet (81 mg total) by mouth daily. 90 tablet 3  . Cholecalciferol (VITAMIN D3) 125 MCG (5000 UT)  CAPS Take 10,000 Units by mouth daily.     . Coenzyme Q10 (CO Q-10) 300 MG CAPS Take 1 capsule by mouth daily.  0  . Cyanocobalamin (B-12 PO) Take 1 tablet by mouth daily.     Marland Kitchen ENTRESTO 97-103 MG TAKE 1 TABLET BY MOUTH TWICE DAILY 180 tablet 3  . Fish Oil-Coenzyme Q10 (FISH OIL PLUS CO Q-10) 1000-30 MG CAPS Take by mouth.    Marland Kitchen GARLIC PO Take 1 tablet by mouth daily.    Marland Kitchen MAGNESIUM PO Take 500 mg by mouth daily.     . metoprolol succinate (TOPROL-XL) 25 MG 24 hr tablet TAKE 1 TABLET(25 MG) BY MOUTH DAILY 90 tablet 3  . Multiple Vitamins-Minerals (MULTIVITAMIN PO) Take 1 tablet by mouth daily.     . nitroGLYCERIN (NITROSTAT) 0.4 MG SL tablet PLACE 1 TABLET  UNDER THE TONGUE EVERY 5 (FIVE) MINUTES X 3 DOSES AS NEEDED FOR CHEST PAIN. 25 tablet 3  . pantoprazole (PROTONIX) 40 MG tablet Take 40 mg by mouth daily as needed.    . testosterone cypionate (DEPO-TESTOSTERONE) 200 MG/ML injection Inject 0.5 mLs (100 mg total) into the muscle every 7 (seven) days. 10 mL 1  . TURMERIC PO Take 500 mg by mouth daily.     . vitamin C (ASCORBIC ACID) 500 MG tablet Take 500 mg by mouth daily.    Marland Kitchen VITAMIN E PO Take 400 mg by mouth daily.      No current facility-administered medications for this visit.    Allergies:   Penicillins    Social History:  The patient  reports that he has never smoked. He has never used smokeless tobacco. He reports that he does not drink alcohol and does not use drugs.   Family History:  The patient'sfamily history includes Heart failure in his father; Hypertension in his mother.    ROS:  Please see the history of present illness. Otherwise, review of systems are positive for none.   All other systems are reviewed and negative.    PHYSICAL EXAM: VS:  BP 124/70   Pulse 84   Ht 5\' 7"  (1.702 m)   Wt 197 lb 6.4 oz (89.5 kg)   SpO2 97%   BMI 30.92 kg/m  , BMI Body mass index is 30.92 kg/m.   General: Well developed, well nourished, NAD Lungs:Clear to ausculation bilaterally. No wheezes, rales, or rhonchi. Breathing is unlabored. Cardiovascular: RRR with S1 S2. No murmurs Extremities: No edema. Radial pulses 2+ bilaterally Neuro: Alert and oriented. No focal deficits. No facial asymmetry. MAE spontaneously. Psych: Responds to questions appropriately with normal affect.     EKG:  EKG is ordered today. The ekg ordered today demonstrates NSR with HR 77bpm   Recent Labs: 06/30/2019: Hemoglobin 15.9; Platelets 315; TSH 1.02 01/27/2020: ALT 22; BUN 14; Creat 0.95; Potassium 4.9; Sodium 139    Lipid Panel    Component Value Date/Time   CHOL 103 10/12/2019 0805   TRIG 106 10/12/2019 0805   HDL 36 (L) 10/12/2019 0805    CHOLHDL 2.9 10/12/2019 0805   CHOLHDL 4.9 06/30/2019 1345   VLDL 21 07/24/2017 1924   LDLCALC 47 10/12/2019 0805   LDLCALC 110 (H) 06/30/2019 1345    Wt Readings from Last 3 Encounters:  02/09/20 197 lb 6.4 oz (89.5 kg)  01/27/20 194 lb (88 kg)  10/27/19 193 lb 6.4 oz (87.7 kg)    Other studies Reviewed: Additional studies/ records that were reviewed today include:  Review of the above  records demonstrates:  Echocardiogram 12/06/2017: Study Conclusions   - Left ventricle: The cavity size was normal. There was mild  concentric hypertrophy. Systolic function was mildly to  moderately reduced. The estimated ejection fraction was in the  range of 40% to 45%. Diffuse hypokinesis. Doppler parameters are  consistent with abnormal left ventricular relaxation (grade 1  diastolic dysfunction). There was no evidence of elevated  ventricular filling pressure by Doppler parameters.  - Aortic valve: There was mild regurgitation.  - Mitral valve: There was mild regurgitation.  - Left atrium: The atrium was moderately dilated.  - Right ventricle: Systolic function was normal.  - Right atrium: The atrium was mildly dilated.  - Tricuspid valve: There was mild regurgitation.  - Pulmonary arteries: Systolic pressure was within the normal  range.  - Inferior vena cava: The vessel was normal in size.   LHC 07/25/2017:   Prox Cx lesion is 100% stenosed.  Ost LAD to Prox LAD lesion is 50% stenosed.  Previously placed Ost 1st Diag stent (unknown type) is widely patent.  Previously placed Prox LAD stent (unknown type) is widely patent.  Prox LAD to Mid LAD lesion is 95% stenosed.  Origin to Prox Graft lesion is 100% stenosed.  There is moderate to severe left ventricular systolic dysfunction.  LV end diastolic pressure is moderately elevated.  The left ventricular ejection fraction is 35-45% by visual estimate.  Mr Falconi anatomy is similar to what it was back in 2008  when Dr. Tamala Julian performed LAD diagonal branch stenting. His LIMA to the LAD is patent. His free RIMA to the obtuse marginal branch is patent. His vein to diagonal branch is occluded. The Y graft to the second marginal graft is occluded as it was 11 years ago. His LV function is significantly reduced compared to prior assessments melena 35% range for unclear reasons. I have reviewed his films with Dr. Tamala Julian. Medical therapy will be pursued. A MYNX device was used to achieve hemostasis.  ASSESSMENT AND PLAN:  1.  CAD s/p CABG without angina: -Stable without anginal symptoms -Continue current regimen   2.  Chronic systolic CHF: -Appears euvolemic on exam -Continue Entresto, carvedilol -Labs performed by PCP 01/27/20>>creatininea stable 1.02  3.  HTN: -Stable, 124/70 -Continue current regimen   4.  HLD: -Last LDL, 47 on 10/12/19 -Continue current regimen   5.  DM2: -Hb A1c, 6.2>>a little higher than previous -Discussed adding more walking (as he was previously doing) to his exercise routine  -Follows with PCP    Current medicines are reviewed at length with the patient today.  The patient does not have concerns regarding medicines.  The following changes have been made:  no change  Labs/ tests ordered today include: None   Orders Placed This Encounter  Procedures  . EKG 12-Lead  . ECHOCARDIOGRAM COMPLETE    Disposition:   FU with Dr. Tamala Julian in 6 months  Signed, Kathyrn Drown, NP  02/09/2020 12:16 PM    [address], [place], [place]  [address] Phone: [phone]; Fax: [phone]

## 2020-02-09 ENCOUNTER — Other Ambulatory Visit: Payer: Self-pay

## 2020-02-09 ENCOUNTER — Encounter: Payer: Self-pay | Admitting: Cardiology

## 2020-02-09 ENCOUNTER — Ambulatory Visit (INDEPENDENT_AMBULATORY_CARE_PROVIDER_SITE_OTHER): Payer: 59 | Admitting: Cardiology

## 2020-02-09 VITALS — BP 124/70 | HR 84 | Ht 67.0 in | Wt 197.4 lb

## 2020-02-09 DIAGNOSIS — I429 Cardiomyopathy, unspecified: Secondary | ICD-10-CM | POA: Diagnosis not present

## 2020-02-09 DIAGNOSIS — I10 Essential (primary) hypertension: Secondary | ICD-10-CM | POA: Diagnosis not present

## 2020-02-09 DIAGNOSIS — I5022 Chronic systolic (congestive) heart failure: Secondary | ICD-10-CM

## 2020-02-09 DIAGNOSIS — E1159 Type 2 diabetes mellitus with other circulatory complications: Secondary | ICD-10-CM

## 2020-02-09 DIAGNOSIS — E785 Hyperlipidemia, unspecified: Secondary | ICD-10-CM

## 2020-02-09 DIAGNOSIS — I25810 Atherosclerosis of coronary artery bypass graft(s) without angina pectoris: Secondary | ICD-10-CM | POA: Diagnosis not present

## 2020-02-09 NOTE — Patient Instructions (Signed)
Medication Instructions:   Your physician recommends that you continue on your current medications as directed. Please refer to the Current Medication list given to you today.  *If you need a refill on your cardiac medications before your next appointment, please call your pharmacy*   Lab Work: None If you have labs (blood work) drawn today and your tests are completely normal, you will receive your results only by: Marland Kitchen MyChart Message (if you have MyChart) OR . A paper copy in the mail If you have any lab test that is abnormal or we need to change your treatment, we will call you to review the results.   Testing/Procedures: Your physician has requested that you have an echocardiogram. Echocardiography is a painless test that uses sound waves to create images of your heart. It provides your doctor with information about the size and shape of your heart and how well your heart's chambers and valves are working. This procedure takes approximately one hour. There are no restrictions for this procedure.     Follow-Up: At [facility], you and your health needs are our priority.  As part of our continuing mission to provide you with exceptional heart care, we have created designated Provider Care Teams.  These Care Teams include your primary Cardiologist (physician) and Advanced Practice Providers (APPs -  Physician Assistants and Nurse Practitioners) who all work together to provide you with the care you need, when you need it.    Follow up in 6 months with Dr Tamala Julian with an ECHO prior.

## 2020-03-24 ENCOUNTER — Other Ambulatory Visit (HOSPITAL_COMMUNITY): Payer: 59

## 2020-03-24 ENCOUNTER — Encounter (HOSPITAL_COMMUNITY): Payer: Self-pay

## 2020-03-24 ENCOUNTER — Encounter (HOSPITAL_COMMUNITY): Payer: Self-pay | Admitting: Cardiology

## 2020-03-24 NOTE — Progress Notes (Signed)
Verified appointment "no show" status with A. Carter at 10:30.

## 2020-04-04 ENCOUNTER — Telehealth (HOSPITAL_COMMUNITY): Payer: Self-pay | Admitting: Cardiology

## 2020-04-04 NOTE — Telephone Encounter (Signed)
Just an FYI. We have made several attempts to contact this patient including sending a letter to schedule or reschedule their echocardiogram. We will be removing the patient from the echo [place].    03/24/20 NO SHOW -MAILED LETTER/LBW    Thank you

## 2020-04-28 ENCOUNTER — Other Ambulatory Visit: Payer: 59

## 2020-04-28 DIAGNOSIS — Z20822 Contact with and (suspected) exposure to covid-19: Secondary | ICD-10-CM

## 2020-05-01 LAB — NOVEL CORONAVIRUS, NAA: SARS-CoV-2, NAA: NOT DETECTED

## 2020-05-01 LAB — SARS-COV-2, NAA 2 DAY TAT

## 2020-05-03 ENCOUNTER — Ambulatory Visit: Payer: 59 | Admitting: Family Medicine

## 2020-05-04 ENCOUNTER — Other Ambulatory Visit: Payer: Self-pay

## 2020-05-04 ENCOUNTER — Encounter: Payer: Self-pay | Admitting: Nurse Practitioner

## 2020-05-04 ENCOUNTER — Ambulatory Visit (INDEPENDENT_AMBULATORY_CARE_PROVIDER_SITE_OTHER): Payer: 59 | Admitting: Nurse Practitioner

## 2020-05-04 VITALS — BP 117/70 | HR 78 | Temp 98.2°F | Resp 20 | Ht 65.5 in | Wt 194.0 lb

## 2020-05-04 DIAGNOSIS — Z139 Encounter for screening, unspecified: Secondary | ICD-10-CM

## 2020-05-04 DIAGNOSIS — E559 Vitamin D deficiency, unspecified: Secondary | ICD-10-CM

## 2020-05-04 DIAGNOSIS — E291 Testicular hypofunction: Secondary | ICD-10-CM

## 2020-05-04 DIAGNOSIS — M2550 Pain in unspecified joint: Secondary | ICD-10-CM | POA: Insufficient documentation

## 2020-05-04 DIAGNOSIS — I5022 Chronic systolic (congestive) heart failure: Secondary | ICD-10-CM

## 2020-05-04 DIAGNOSIS — H539 Unspecified visual disturbance: Secondary | ICD-10-CM

## 2020-05-04 DIAGNOSIS — E538 Deficiency of other specified B group vitamins: Secondary | ICD-10-CM | POA: Insufficient documentation

## 2020-05-04 DIAGNOSIS — I2510 Atherosclerotic heart disease of native coronary artery without angina pectoris: Secondary | ICD-10-CM | POA: Diagnosis not present

## 2020-05-04 DIAGNOSIS — K635 Polyp of colon: Secondary | ICD-10-CM

## 2020-05-04 DIAGNOSIS — K219 Gastro-esophageal reflux disease without esophagitis: Secondary | ICD-10-CM

## 2020-05-04 DIAGNOSIS — I10 Essential (primary) hypertension: Secondary | ICD-10-CM

## 2020-05-04 NOTE — Assessment & Plan Note (Signed)
-  taking vit D -will check labs

## 2020-05-04 NOTE — Assessment & Plan Note (Signed)
-  has joint pain to multiple joints -has hx of doing karate for many years; has hand pain, shoulder pain, and knee pain -he states he felt better after taking testosterone with Dr. Anastasio Champion

## 2020-05-04 NOTE — Assessment & Plan Note (Signed)
-  taking B12 supplement -will check labs

## 2020-05-04 NOTE — Assessment & Plan Note (Signed)
-  followed by cardiology -on entresto

## 2020-05-04 NOTE — Progress Notes (Signed)
New Patient Office Visit  Subjective:  Patient ID: Scott Roth, male    DOB: 1956-02-12  Age: 65 y.o. MRN: [id number]  CC:  Chief Complaint  Patient presents with  . New Patient (Initial Visit)    Needs updating on health maintenance.     HPI Scott Roth presents for new patient visit. Transferring care from Dr. Anastasio Champion. Last physical was around October 2021. Last labs were drawn on 01/27/20.  She would like to be set up for screenings including ophthalmologist and colonoscopy.  Past Medical History:  Diagnosis Date  . Arthritis    bilateral hands, shoulders, bilateral knees  . CAD (coronary artery disease)    a. CABG x4 with LIMA to dLAD, free RIMA to OM1, SVG to D1, SVG to OM2 in 2006. b. PCI 2008 with notable ventricular fibrillation during cath) - DES to proxLAD/1stdiagonal. c. Cath 07/25/17 demonstrated previously placed stents, patent LIMA-LAD, patent free RIMA-OM, occluded VG-diagonal and occluded Y graft-OM2, without significant change from prior, elevated LVEDP.  . Cardiomyopathy ([place])    EF prev normal 2004, 2008 // EF 35% by cath in 07/2017 // Echo 8/19:  Mild conc LVH, EF 40-45, diff HK, Gr 1 DD, mild AI, mild MR, mod LAE, normal RVSF, mild RAE, mild TR  . Chest pain with moderate risk of acute coronary syndrome 07/24/2017  . Chronic systolic CHF (congestive heart failure) ([place])   . Erectile dysfunction   . GERD (gastroesophageal reflux disease)   . Hyperlipidemia   . Hypertension   . Sleep apnea    does not wear CPAP mask  . Tubular adenoma     Past Surgical History:  Procedure Laterality Date  . 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 CORONARY ARTERY BYPASS GRAFT  2008   x4 vessels  . EYE SURGERY Bilateral    Lasik  . KNEE ARTHROPLASTY Left   . LEFT HEART CATH AND CORS/GRAFTS ANGIOGRAPHY N/A 07/25/2017   Procedure: LEFT HEART CATH AND CORS/GRAFTS ANGIOGRAPHY;  Surgeon: Lorretta Harp, MD;   Location: [facility];  Service: Cardiovascular;  Laterality: N/A;  . SHOULDER SURGERY Right   . TOTAL KNEE ARTHROPLASTY Right 08/25/2014  . TOTAL KNEE ARTHROPLASTY Right 08/25/2014   Procedure: RIGHT TOTAL KNEE ARTHROPLASTY;  Surgeon: Kathryne Hitch, MD;  Location: [facility];  Service: Orthopedics;  Laterality: Right;    Family History  Problem Relation Age of Onset  . Heart failure Father   . Hypertension Mother     Social History   Socioeconomic History  . Marital status: Married    Spouse name: Not on file  . Number of children: Not on file  . Years of education: Not on file  . Highest education level: Not on file  Occupational History  . Occupation: Farming    Comment: owned multiple businesses; owns a Financial controller, Barrister's clerk  Tobacco Use  . Smoking status: Never Smoker  . Smokeless tobacco: Never Used  Vaping Use  . Vaping Use: Never used  Substance and Sexual Activity  . Alcohol use: Yes    Comment: very little alcohol; has bee rin fridge that is over 2 months old  . Drug use: No  . Sexual activity: Yes    Partners: Female  Other Topics Concern  . Not on file  Social History Narrative   Married for 2 years.Lives with wife and step daughter.Originally born [place].Retired,business entrepeuer-owns several  companies.   Social Determinants of Health   Financial Resource Strain: Not on file  Food Insecurity: Not on file  Transportation Needs: Not on file  Physical Activity: Not on file  Stress: Not on file  Social Connections: Not on file  Intimate Partner Violence: Not on file    ROS Review of Systems  Constitutional: Negative.   Respiratory: Negative.   Cardiovascular: Negative.   Musculoskeletal: Positive for arthralgias.    Objective:   Today's Vitals: BP 117/70   Pulse 78   Temp 98.2 F (36.8 C)   Resp 20   Ht 5' 5.5" (1.664 m)   Wt 194 lb (88 kg)   SpO2 93%   BMI 31.79 kg/m   Physical Exam Constitutional:       Appearance: Normal appearance.  Cardiovascular:     Rate and Rhythm: Normal rate and regular rhythm.     Pulses: Normal pulses.     Heart sounds: Normal heart sounds.  Pulmonary:     Effort: Pulmonary effort is normal.     Breath sounds: Normal breath sounds.  Neurological:     Mental Status: He is alert.     Assessment & Plan:   Problem List Items Addressed This Visit      Cardiovascular and Mediastinum   Hypertension    -BP well controlled -taking metoprolol      Relevant Orders   CBC with Differential/Platelet   CMP14+EGFR   Lipid Panel With LDL/HDL Ratio   CAD (coronary artery disease)    -followed by cardiology -no acute concerns      Chronic systolic CHF (congestive heart failure) ([place])    -followed by cardiology -on entresto         Digestive   Esophageal reflux    -no acute issues -taking pantoprazole      Colon polyp    -he states he is due for f/u colonoscopy -referred to GI         Other   Polyarthralgia    -has joint pain to multiple joints -has hx of doing karate for many years; has hand pain, shoulder pain, and knee pain -he states he felt better after taking testosterone with Dr. Anastasio Champion      Vision changes    -he has been having headaches that he attributes to vision changes -discussed setting up appointment with optometrist, but he wants ophthalmologist referral      Relevant Orders   Ambulatory referral to Ophthalmology   B12 deficiency    -taking B12 supplement -will check labs      Relevant Orders   B12   Vitamin D deficiency    -taking vit D -will check labs      Relevant Orders   Vitamin D (25 hydroxy)    Other Visit Diagnoses    Screening due    -  Primary   Relevant Orders   Ambulatory referral to Gastroenterology   Hypogonadism in male       Relevant Orders   Testosterone, Free, Total, SHBG      Outpatient Encounter Medications as of 05/04/2020  Medication Sig  . aspirin EC 81 MG tablet Take 1 tablet  (81 mg total) by mouth daily.  . Cholecalciferol (VITAMIN D3) 125 MCG (5000 UT) CAPS Take 10,000 Units by mouth daily.   . Coenzyme Q10 (CO Q-10) 300 MG CAPS Take 1 capsule by mouth daily.  . Cyanocobalamin (B-12 PO) Take 1 tablet by mouth daily.   Marland Kitchen ENTRESTO 97-103 MG  TAKE 1 TABLET BY MOUTH TWICE DAILY  . Fish Oil-Coenzyme Q10 (FISH OIL PLUS CO Q-10) 1000-30 MG CAPS Take by mouth.  Marland Kitchen GARLIC PO Take 1 tablet by mouth daily.  Marland Kitchen MAGNESIUM PO Take 500 mg by mouth daily.   . metoprolol succinate (TOPROL-XL) 25 MG 24 hr tablet TAKE 1 TABLET(25 MG) BY MOUTH DAILY  . Multiple Vitamins-Minerals (MULTIVITAMIN PO) Take 1 tablet by mouth daily.   . nitroGLYCERIN (NITROSTAT) 0.4 MG SL tablet PLACE 1 TABLET UNDER THE TONGUE EVERY 5 (FIVE) MINUTES X 3 DOSES AS NEEDED FOR CHEST PAIN.  Marland Kitchen pantoprazole (PROTONIX) 40 MG tablet Take 40 mg by mouth daily as needed.  . testosterone cypionate (DEPO-TESTOSTERONE) 200 MG/ML injection Inject 0.5 mLs (100 mg total) into the muscle every 7 (seven) days.  . TURMERIC PO Take 500 mg by mouth daily.   . vitamin C (ASCORBIC ACID) 500 MG tablet Take 500 mg by mouth daily.  Marland Kitchen VITAMIN E PO Take 400 mg by mouth daily.    No facility-administered encounter medications on file as of 05/04/2020.    Follow-up: Return in about 1 month (around 06/04/2020) for Lab follow-up.   Noreene Larsson, NP

## 2020-05-04 NOTE — Assessment & Plan Note (Signed)
-  he has been having headaches that he attributes to vision changes -discussed setting up appointment with optometrist, but he wants ophthalmologist referral

## 2020-05-04 NOTE — Assessment & Plan Note (Signed)
-  no acute issues -taking pantoprazole

## 2020-05-04 NOTE — Assessment & Plan Note (Signed)
-  he states he is due for f/u colonoscopy -referred to GI

## 2020-05-04 NOTE — Assessment & Plan Note (Signed)
-  BP well controlled -taking metoprolol

## 2020-05-04 NOTE — Assessment & Plan Note (Signed)
-  followed by cardiology -no acute concerns

## 2020-05-04 NOTE — Patient Instructions (Signed)
We set you up for colonoscopy and ophthalmology appointment. If you feel like you just need a vision screening with an optometrist, you can set one of those up without a referral.  We will meet back up in 1 month for a lab follow-up.

## 2020-05-25 ENCOUNTER — Other Ambulatory Visit: Payer: Self-pay | Admitting: Physician Assistant

## 2020-05-25 ENCOUNTER — Other Ambulatory Visit: Payer: Self-pay | Admitting: Nurse Practitioner

## 2020-06-03 ENCOUNTER — Other Ambulatory Visit: Payer: Self-pay

## 2020-06-03 ENCOUNTER — Ambulatory Visit (HOSPITAL_COMMUNITY): Payer: Medicare Other | Attending: Internal Medicine

## 2020-06-03 DIAGNOSIS — I25810 Atherosclerosis of coronary artery bypass graft(s) without angina pectoris: Secondary | ICD-10-CM | POA: Insufficient documentation

## 2020-06-03 DIAGNOSIS — I5022 Chronic systolic (congestive) heart failure: Secondary | ICD-10-CM | POA: Diagnosis present

## 2020-06-03 DIAGNOSIS — I429 Cardiomyopathy, unspecified: Secondary | ICD-10-CM | POA: Diagnosis present

## 2020-06-03 LAB — ECHOCARDIOGRAM COMPLETE
Area-P 1/2: 3.85 cm2
P 1/2 time: 437 msec
S' Lateral: 4.1 cm

## 2020-06-03 MED ORDER — PERFLUTREN LIPID MICROSPHERE
1.0000 mL | INTRAVENOUS | Status: AC | PRN
  Administered 2020-06-03: 1 mL via INTRAVENOUS

## 2020-06-06 ENCOUNTER — Encounter: Payer: Self-pay | Admitting: Nurse Practitioner

## 2020-06-06 ENCOUNTER — Ambulatory Visit (INDEPENDENT_AMBULATORY_CARE_PROVIDER_SITE_OTHER): Payer: Medicare Other | Admitting: Nurse Practitioner

## 2020-06-06 ENCOUNTER — Other Ambulatory Visit: Payer: Self-pay

## 2020-06-06 ENCOUNTER — Telehealth: Payer: Self-pay

## 2020-06-06 VITALS — BP 135/79 | HR 76 | Temp 97.6°F | Resp 20 | Ht 66.0 in | Wt 194.0 lb

## 2020-06-06 DIAGNOSIS — K635 Polyp of colon: Secondary | ICD-10-CM | POA: Diagnosis not present

## 2020-06-06 DIAGNOSIS — G4733 Obstructive sleep apnea (adult) (pediatric): Secondary | ICD-10-CM | POA: Diagnosis not present

## 2020-06-06 DIAGNOSIS — I5022 Chronic systolic (congestive) heart failure: Secondary | ICD-10-CM

## 2020-06-06 DIAGNOSIS — R42 Dizziness and giddiness: Secondary | ICD-10-CM

## 2020-06-06 MED ORDER — MECLIZINE HCL 25 MG PO TABS: 25.0000 mg | ORAL_TABLET | Freq: Three times a day (TID) | ORAL | 0 refills | Status: AC | PRN

## 2020-06-06 NOTE — Telephone Encounter (Signed)
Is this okay to provide?

## 2020-06-06 NOTE — Assessment & Plan Note (Signed)
-  taking entresto -had recent w/u; LVEF 50%

## 2020-06-06 NOTE — Telephone Encounter (Signed)
Pt is coming in today, and needs a letter stating that he has to carrying syringes due to medication for travel

## 2020-06-06 NOTE — Progress Notes (Signed)
Acute Office Visit  Subjective:    Patient ID: Scott Roth, male    DOB: 1955-09-21, 65 y.o.   MRN: [id number]  Chief Complaint  Patient presents with  . Hypertension    Follow up    HPI Patient is in today for vertigo.  She notices this is worse when he gets up in the AM and is standing up.  He states he had an EKG at [facility] on Friday at 1 pm as part of a medical exam to go out of the country.  He states that he has not been taking his testosterone, and he stopped this last week. He was taking a weekly injectio, and he stopped taking this about 10 days ago.  Past Medical History:  Diagnosis Date  . Arthritis    bilateral hands, shoulders, bilateral knees  . CAD (coronary artery disease)    a. CABG x4 with LIMA to dLAD, free RIMA to OM1, SVG to D1, SVG to OM2 in 2006. b. PCI 2008 with notable ventricular fibrillation during cath) - DES to proxLAD/1stdiagonal. c. Cath 07/25/17 demonstrated previously placed stents, patent LIMA-LAD, patent free RIMA-OM, occluded VG-diagonal and occluded Y graft-OM2, without significant change from prior, elevated LVEDP.  . Cardiomyopathy ([place])    EF prev normal 2004, 2008 // EF 35% by cath in 07/2017 // Echo 8/19:  Mild conc LVH, EF 40-45, diff HK, Gr 1 DD, mild AI, mild MR, mod LAE, normal RVSF, mild RAE, mild TR  . Chest pain with moderate risk of acute coronary syndrome 07/24/2017  . Chronic systolic CHF (congestive heart failure) ([place])   . Erectile dysfunction   . GERD (gastroesophageal reflux disease)   . Hyperlipidemia   . Hypertension   . Sleep apnea    does not wear CPAP mask  . Tubular adenoma     Past Surgical History:  Procedure Laterality Date  . 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 CORONARY ARTERY BYPASS GRAFT  2008   x4 vessels  . EYE SURGERY Bilateral    Lasik  . KNEE ARTHROPLASTY Left   . LEFT HEART CATH AND CORS/GRAFTS ANGIOGRAPHY N/A 07/25/2017   Procedure: LEFT  HEART CATH AND CORS/GRAFTS ANGIOGRAPHY;  Surgeon: Lorretta Harp, MD;  Location: [facility];  Service: Cardiovascular;  Laterality: N/A;  . SHOULDER SURGERY Right   . TOTAL KNEE ARTHROPLASTY Right 08/25/2014  . TOTAL KNEE ARTHROPLASTY Right 08/25/2014   Procedure: RIGHT TOTAL KNEE ARTHROPLASTY;  Surgeon: Kathryne Hitch, MD;  Location: [facility];  Service: Orthopedics;  Laterality: Right;    Family History  Problem Relation Age of Onset  . Heart failure Father   . Hypertension Mother     Social History   Socioeconomic History  . Marital status: Married    Spouse name: Not on file  . Number of children: Not on file  . Years of education: Not on file  . Highest education level: Not on file  Occupational History  . Occupation: Farming    Comment: owned multiple businesses; owns a Financial controller, Barrister's clerk  Tobacco Use  . Smoking status: Never Smoker  . Smokeless tobacco: Never Used  Vaping Use  . Vaping Use: Never used  Substance and Sexual Activity  . Alcohol use: Yes    Comment: very little alcohol; has bee rin fridge that is over 2 months old  . Drug use: No  . Sexual activity: Yes  Partners: Female  Other Topics Concern  . Not on file  Social History Narrative   Married for 2 years.Lives with wife and step daughter.Originally born [place].Retired,business entrepeuer-owns several companies.   Social Determinants of Health   Financial Resource Strain: Not on file  Food Insecurity: Not on file  Transportation Needs: Not on file  Physical Activity: Not on file  Stress: Not on file  Social Connections: Not on file  Intimate Partner Violence: Not on file    Outpatient Medications Prior to Visit  Medication Sig Dispense Refill  . aspirin EC 81 MG tablet Take 1 tablet (81 mg total) by mouth daily. 90 tablet 3  . Cholecalciferol (VITAMIN D3) 125 MCG (5000 UT) CAPS Take 10,000 Units by mouth daily.     . Coenzyme Q10 (CO Q-10) 300 MG CAPS Take 1  capsule by mouth daily.  0  . Cyanocobalamin (B-12 PO) Take 1 tablet by mouth daily.     Marland Kitchen ENTRESTO 97-103 MG TAKE 1 TABLET BY MOUTH TWICE DAILY 180 tablet 3  . Fish Oil-Coenzyme Q10 (FISH OIL PLUS CO Q-10) 1000-30 MG CAPS Take by mouth.    Marland Kitchen GARLIC PO Take 1 tablet by mouth daily.    Marland Kitchen MAGNESIUM PO Take 500 mg by mouth daily.     . metoprolol succinate (TOPROL-XL) 25 MG 24 hr tablet TAKE 1 TABLET(25 MG) BY MOUTH DAILY 90 tablet 1  . Multiple Vitamins-Minerals (MULTIVITAMIN PO) Take 1 tablet by mouth daily.     . nitroGLYCERIN (NITROSTAT) 0.4 MG SL tablet PLACE 1 TABLET UNDER THE TONGUE EVERY 5 (FIVE) MINUTES X 3 DOSES AS NEEDED FOR CHEST PAIN. 25 tablet 3  . pantoprazole (PROTONIX) 40 MG tablet Take 40 mg by mouth daily as needed.    . testosterone cypionate (DEPO-TESTOSTERONE) 200 MG/ML injection Inject 0.5 mLs (100 mg total) into the muscle every 7 (seven) days. 10 mL 1  . TURMERIC PO Take 500 mg by mouth daily.     . vitamin C (ASCORBIC ACID) 500 MG tablet Take 500 mg by mouth daily.    Marland Kitchen VITAMIN E PO Take 400 mg by mouth daily.      No facility-administered medications prior to visit.    Allergies  Allergen Reactions  . Penicillins Itching and Rash    Other reaction(s): Other (See Comments) Has patient had a PCN reaction causing immediate rash, facial/tongue/throat swelling, SOB or lightheadedness with hypotension: No Has patient had a PCN reaction causing severe rash involving mucus membranes or skin necrosis: No Has patient had a PCN reaction that required hospitalization: No Has patient had a PCN reaction occurring within the last 10 years: No If all of the above answers are "NO", then may proceed with Cephalosporin use.    Review of Systems  Constitutional: Negative.   Respiratory: Negative.   Cardiovascular: Negative.   Neurological: Positive for dizziness.  Psychiatric/Behavioral: Negative.        Objective:    Physical Exam Constitutional:      Appearance: Normal  appearance.  Cardiovascular:     Rate and Rhythm: Normal rate and regular rhythm.     Pulses: Normal pulses.     Heart sounds: Normal heart sounds.  Pulmonary:     Effort: Pulmonary effort is normal.     Breath sounds: Normal breath sounds.  Musculoskeletal:        General: Normal range of motion.  Neurological:     Mental Status: He is alert.  Psychiatric:  Mood and Affect: Mood normal.        Behavior: Behavior normal.        Thought Content: Thought content normal.        Judgment: Judgment normal.     BP 135/79   Pulse 76   Temp 97.6 F (36.4 C)   Resp 20   Ht 5\' 6"  (1.676 m)   Wt 194 lb (88 kg)   SpO2 94%   BMI 31.31 kg/m  Wt Readings from Last 3 Encounters:  06/06/20 194 lb (88 kg)  05/04/20 194 lb (88 kg)  02/09/20 197 lb 6.4 oz (89.5 kg)    Health Maintenance Due  Topic Date Due  . TETANUS/TDAP  Never done  . COLONOSCOPY (Pts 45-32yrs Insurance coverage will need to be confirmed)  Never done  . INFLUENZA VACCINE  Never done  . COVID-19 Vaccine (3 - Booster for Pfizer series) 02/05/2020  . PNA vac Low Risk Adult (1 of 2 - PCV13) 06/04/2020    There are no preventive care reminders to display for this patient.   Lab Results  Component Value Date   TSH 1.02 06/30/2019   Lab Results  Component Value Date   WBC 6.1 06/30/2019   HGB 15.9 06/30/2019   HCT 47.2 06/30/2019   MCV 92.9 06/30/2019   PLT 315 06/30/2019   Lab Results  Component Value Date   NA 139 01/27/2020   K 4.9 01/27/2020   CO2 27 01/27/2020   GLUCOSE 147 (H) 01/27/2020   BUN 14 01/27/2020   CREATININE 0.95 01/27/2020   BILITOT 1.2 01/27/2020   ALKPHOS 57 10/12/2019   AST 21 01/27/2020   ALT 22 01/27/2020   PROT 6.6 01/27/2020   ALBUMIN 4.4 10/12/2019   CALCIUM 9.1 01/27/2020   ANIONGAP 7 09/18/2017   GFR 89.73 08/31/2013   Lab Results  Component Value Date   CHOL 103 10/12/2019   Lab Results  Component Value Date   HDL 36 (L) 10/12/2019   Lab Results   Component Value Date   LDLCALC 47 10/12/2019   Lab Results  Component Value Date   TRIG 106 10/12/2019   Lab Results  Component Value Date   CHOLHDL 2.9 10/12/2019   Lab Results  Component Value Date   HGBA1C 6.2 (H) 01/27/2020       Assessment & Plan:   Problem List Items Addressed This Visit      Cardiovascular and Mediastinum   Chronic systolic CHF (congestive heart failure) ([place])    -taking entresto -had recent w/u; LVEF 50%        Digestive   Colon polyp    -referred to GI for colonoscopy      Relevant Orders   Ambulatory referral to Gastroenterology     Other   Vertigo    -likely BPPV -negative Dix-Hallpike today, but he has been asymptomatic today -Rx. Meclizine -if no improvement with meclizine, he has ENT referral pending for sleep apnea, so he can see ENT      Relevant Orders   Ambulatory referral to ENT    Other Visit Diagnoses    OSA (obstructive sleep apnea)    -  Primary   Relevant Orders   Ambulatory referral to ENT       Meds ordered this encounter  Medications  . meclizine (ANTIVERT) 25 MG tablet    Sig: Take 1 tablet (25 mg total) by mouth 3 (three) times daily as needed for dizziness.    Dispense:  30 tablet    Refill:  0     Noreene Larsson, NP

## 2020-06-06 NOTE — Patient Instructions (Signed)
Labs drawn this AM, and we will call with results. For testosterone, we will check that again in 6 weeks and decide if that should be added back.

## 2020-06-06 NOTE — Telephone Encounter (Signed)
Letter printed.

## 2020-06-06 NOTE — Assessment & Plan Note (Signed)
-  likely BPPV -negative Dix-Hallpike today, but he has been asymptomatic today -Rx. Meclizine -if no improvement with meclizine, he has ENT referral pending for sleep apnea, so he can see ENT

## 2020-06-06 NOTE — Telephone Encounter (Signed)
Yes. He takes testosterone injections.

## 2020-06-06 NOTE — Assessment & Plan Note (Signed)
-  referred to GI for colonoscopy

## 2020-06-07 ENCOUNTER — Other Ambulatory Visit: Payer: Self-pay | Admitting: Nurse Practitioner

## 2020-06-07 DIAGNOSIS — R7301 Impaired fasting glucose: Secondary | ICD-10-CM

## 2020-06-07 NOTE — Progress Notes (Signed)
His RBC, Hgb, and hematocrit are all elevated, but this is most likely caused by sleep apnea as well as testosterone therapy. His testosterone is at that high end of normal, and he skipped his last injection, so I would bet that the testosterone is more likely the cause of the polycythemia.  His glucose was elevated at 225, so I am adding an A1c to his labs. Based on that, we may need to add medication.  He is getting plenty of B-12, so he can cut his supplement in half, since he has over the normal limit of B-12, and his Vit.D is on the low end of normal, so he should continue to take a Vit. D supplement.

## 2020-06-08 LAB — CMP14+EGFR
ALT: 31 IU/L (ref 0–44)
AST: 27 IU/L (ref 0–40)
Albumin/Globulin Ratio: 1.6 (ref 1.2–2.2)
Albumin: 4.1 g/dL (ref 3.8–4.8)
Alkaline Phosphatase: 60 IU/L (ref 44–121)
BUN/Creatinine Ratio: 10 (ref 10–24)
BUN: 10 mg/dL (ref 8–27)
Bilirubin Total: 1.1 mg/dL (ref 0.0–1.2)
CO2: 18 mmol/L — ABNORMAL LOW (ref 20–29)
Calcium: 9.3 mg/dL (ref 8.6–10.2)
Chloride: 99 mmol/L (ref 96–106)
Creatinine, Ser: 1.01 mg/dL (ref 0.76–1.27)
GFR calc Af Amer: 90 mL/min/{1.73_m2} (ref >59)
GFR calc non Af Amer: 78 mL/min/{1.73_m2} (ref >59)
Globulin, Total: 2.6 g/dL (ref 1.5–4.5)
Glucose: 225 mg/dL — ABNORMAL HIGH (ref 65–99)
Potassium: 4.6 mmol/L (ref 3.5–5.2)
Sodium: 134 mmol/L (ref 134–144)
Total Protein: 6.7 g/dL (ref 6.0–8.5)

## 2020-06-08 LAB — CBC WITH DIFFERENTIAL/PLATELET
Basophils Absolute: 0.1 10*3/uL (ref 0.0–0.2)
Basos: 1 %
EOS (ABSOLUTE): 0.2 10*3/uL (ref 0.0–0.4)
Eos: 3 %
Hematocrit: 58.7 % — ABNORMAL HIGH (ref 37.5–51.0)
Hemoglobin: 19.7 g/dL — ABNORMAL HIGH (ref 13.0–17.7)
Immature Grans (Abs): 0 10*3/uL (ref 0.0–0.1)
Immature Granulocytes: 1 %
Lymphocytes Absolute: 2 10*3/uL (ref 0.7–3.1)
Lymphs: 25 %
MCH: 31.8 pg (ref 26.6–33.0)
MCHC: 33.6 g/dL (ref 31.5–35.7)
MCV: 95 fL (ref 79–97)
Monocytes Absolute: 0.6 10*3/uL (ref 0.1–0.9)
Monocytes: 7 %
Neutrophils Absolute: 5.2 10*3/uL (ref 1.4–7.0)
Neutrophils: 63 %
Platelets: 260 10*3/uL (ref 150–450)
RBC: 6.2 x10E6/uL — ABNORMAL HIGH (ref 4.14–5.80)
RDW: 13.5 % (ref 11.6–15.4)
WBC: 8.1 10*3/uL (ref 3.4–10.8)

## 2020-06-08 LAB — VITAMIN B12: Vitamin B-12: 1877 pg/mL — ABNORMAL HIGH (ref 232–1245)

## 2020-06-08 LAB — VITAMIN D 25 HYDROXY (VIT D DEFICIENCY, FRACTURES): Vit D, 25-Hydroxy: 31.9 ng/mL (ref 30.0–100.0)

## 2020-06-08 LAB — LIPID PANEL WITH LDL/HDL RATIO
Cholesterol, Total: 112 mg/dL (ref 100–199)
HDL: 35 mg/dL — ABNORMAL LOW (ref >39)
LDL Chol Calc (NIH): 50 mg/dL (ref 0–99)
LDL/HDL Ratio: 1.4 ratio (ref 0.0–3.6)
Triglycerides: 155 mg/dL — ABNORMAL HIGH (ref 0–149)
VLDL Cholesterol Cal: 27 mg/dL (ref 5–40)

## 2020-06-08 LAB — TESTOSTERONE, FREE, TOTAL, SHBG
Sex Hormone Binding: 45.3 nmol/L (ref 19.3–76.4)
Testosterone, Free: 9.9 pg/mL (ref 6.6–18.1)
Testosterone: 772 ng/dL (ref 264–916)

## 2020-06-08 LAB — SPECIMEN STATUS REPORT

## 2020-06-08 LAB — HEMOGLOBIN A1C
Est. average glucose Bld gHb Est-mCnc: 126 mg/dL
Hgb A1c MFr Bld: 6 % — ABNORMAL HIGH (ref 4.8–5.6)

## 2020-07-18 ENCOUNTER — Other Ambulatory Visit: Payer: Self-pay

## 2020-07-18 ENCOUNTER — Ambulatory Visit (INDEPENDENT_AMBULATORY_CARE_PROVIDER_SITE_OTHER): Payer: Medicare Other | Admitting: Nurse Practitioner

## 2020-07-18 ENCOUNTER — Encounter: Payer: Self-pay | Admitting: Nurse Practitioner

## 2020-07-18 VITALS — BP 129/86 | HR 65 | Temp 97.6°F | Resp 18 | Ht 66.0 in | Wt 194.0 lb

## 2020-07-18 DIAGNOSIS — E782 Mixed hyperlipidemia: Secondary | ICD-10-CM

## 2020-07-18 DIAGNOSIS — R42 Dizziness and giddiness: Secondary | ICD-10-CM

## 2020-07-18 DIAGNOSIS — E291 Testicular hypofunction: Secondary | ICD-10-CM | POA: Diagnosis not present

## 2020-07-18 DIAGNOSIS — H539 Unspecified visual disturbance: Secondary | ICD-10-CM

## 2020-07-18 DIAGNOSIS — E538 Deficiency of other specified B group vitamins: Secondary | ICD-10-CM

## 2020-07-18 DIAGNOSIS — K635 Polyp of colon: Secondary | ICD-10-CM

## 2020-07-18 DIAGNOSIS — M79671 Pain in right foot: Secondary | ICD-10-CM

## 2020-07-18 DIAGNOSIS — I10 Essential (primary) hypertension: Secondary | ICD-10-CM | POA: Diagnosis not present

## 2020-07-18 DIAGNOSIS — K219 Gastro-esophageal reflux disease without esophagitis: Secondary | ICD-10-CM

## 2020-07-18 MED ORDER — PANTOPRAZOLE SODIUM 40 MG PO TBEC: 40.0000 mg | DELAYED_RELEASE_TABLET | Freq: Every day | ORAL | 1 refills | Status: DC | PRN

## 2020-07-18 NOTE — Assessment & Plan Note (Signed)
-  he was referred to GI at last OV, but declined to set up appointment d/t travel to [place] -he will call to set this up

## 2020-07-18 NOTE — Assessment & Plan Note (Signed)
BP well controlled today.

## 2020-07-18 NOTE — Patient Instructions (Signed)
Please have testosterone drawn before 10 AM.  Will send refills of testosterone based on labs.  Please have fasting labs drawn 2-3 days prior to your appointment so we can discuss the results during your office visit.

## 2020-07-18 NOTE — Assessment & Plan Note (Signed)
-  takes PRN meclizine -no issues today

## 2020-07-18 NOTE — Assessment & Plan Note (Signed)
-  will check labs prior to next visit

## 2020-07-18 NOTE — Assessment & Plan Note (Signed)
-  referral to podiatry

## 2020-07-18 NOTE — Progress Notes (Signed)
Acute Office Visit  Subjective:    Patient ID: Scott Roth, male    DOB: Aug 23, 1955, 65 y.o.   MRN: [id number]  Chief Complaint  Patient presents with  . Follow-up    Pantoprazole - refill     HPI Patient is in today for lab follow-up for hypogonadism.  At last OV, we checked his testosterone levels, and they were WNL. He had not been taking testosterone prior to the last lab draw, so we will recheck T to see if he has hypogonadism. He states that he has taken 3 testosterone injections since our last visit. He states that he has been feeling fatigue without testosterone.  ENT (Sleep apnea and vertigo) and GI (screening colonoscopy) referrals were denied.  Past Medical History:  Diagnosis Date  . Arthritis    bilateral hands, shoulders, bilateral knees  . CAD (coronary artery disease)    a. CABG x4 with LIMA to dLAD, free RIMA to OM1, SVG to D1, SVG to OM2 in 2006. b. PCI 2008 with notable ventricular fibrillation during cath) - DES to proxLAD/1stdiagonal. c. Cath 07/25/17 demonstrated previously placed stents, patent LIMA-LAD, patent free RIMA-OM, occluded VG-diagonal and occluded Y graft-OM2, without significant change from prior, elevated LVEDP.  . Cardiomyopathy ([place])    EF prev normal 2004, 2008 // EF 35% by cath in 07/2017 // Echo 8/19:  Mild conc LVH, EF 40-45, diff HK, Gr 1 DD, mild AI, mild MR, mod LAE, normal RVSF, mild RAE, mild TR  . Chest pain with moderate risk of acute coronary syndrome 07/24/2017  . Chronic systolic CHF (congestive heart failure) ([place])   . Erectile dysfunction   . GERD (gastroesophageal reflux disease)   . Hyperlipidemia   . Hypertension   . Sleep apnea    does not wear CPAP mask  . Tubular adenoma     Past Surgical History:  Procedure Laterality Date  . 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 CORONARY ARTERY BYPASS GRAFT  2008   x4 vessels  . EYE SURGERY Bilateral    Lasik  . KNEE  ARTHROPLASTY Left   . LEFT HEART CATH AND CORS/GRAFTS ANGIOGRAPHY N/A 07/25/2017   Procedure: LEFT HEART CATH AND CORS/GRAFTS ANGIOGRAPHY;  Surgeon: Lorretta Harp, MD;  Location: [facility];  Service: Cardiovascular;  Laterality: N/A;  . SHOULDER SURGERY Right   . TOTAL KNEE ARTHROPLASTY Right 08/25/2014  . TOTAL KNEE ARTHROPLASTY Right 08/25/2014   Procedure: RIGHT TOTAL KNEE ARTHROPLASTY;  Surgeon: Kathryne Hitch, MD;  Location: [facility];  Service: Orthopedics;  Laterality: Right;    Family History  Problem Relation Age of Onset  . Heart failure Father   . Hypertension Mother     Social History   Socioeconomic History  . Marital status: Married    Spouse name: Not on file  . Number of children: Not on file  . Years of education: Not on file  . Highest education level: Not on file  Occupational History  . Occupation: Farming    Comment: owned multiple businesses; owns a Financial controller, Barrister's clerk  Tobacco Use  . Smoking status: Never Smoker  . Smokeless tobacco: Never Used  Vaping Use  . Vaping Use: Never used  Substance and Sexual Activity  . Alcohol use: Yes    Comment: very little alcohol; has bee rin fridge that is over 2 months old  . Drug use: No  . Sexual activity:  Yes    Partners: Female  Other Topics Concern  . Not on file  Social History Narrative   Married for 2 years.Lives with wife and step daughter.Originally born [place].Retired,business entrepeuer-owns several companies.   Social Determinants of Health   Financial Resource Strain: Not on file  Food Insecurity: Not on file  Transportation Needs: Not on file  Physical Activity: Not on file  Stress: Not on file  Social Connections: Not on file  Intimate Partner Violence: Not on file    Outpatient Medications Prior to Visit  Medication Sig Dispense Refill  . aspirin EC 81 MG tablet Take 1 tablet (81 mg total) by mouth daily. 90 tablet 3  . Cholecalciferol (VITAMIN D3) 125 MCG  (5000 UT) CAPS Take 10,000 Units by mouth daily.     . Coenzyme Q10 (CO Q-10) 300 MG CAPS Take 1 capsule by mouth daily.  0  . Cyanocobalamin (B-12 PO) Take 1 tablet by mouth daily.     Marland Kitchen ENTRESTO 97-103 MG TAKE 1 TABLET BY MOUTH TWICE DAILY 180 tablet 3  . Fish Oil-Coenzyme Q10 (FISH OIL PLUS CO Q-10) 1000-30 MG CAPS Take by mouth.    Marland Kitchen GARLIC PO Take 1 tablet by mouth daily.    Marland Kitchen MAGNESIUM PO Take 500 mg by mouth daily.     . meclizine (ANTIVERT) 25 MG tablet Take 1 tablet (25 mg total) by mouth 3 (three) times daily as needed for dizziness. 30 tablet 0  . metoprolol succinate (TOPROL-XL) 25 MG 24 hr tablet TAKE 1 TABLET(25 MG) BY MOUTH DAILY 90 tablet 1  . Multiple Vitamins-Minerals (MULTIVITAMIN PO) Take 1 tablet by mouth daily.     . nitroGLYCERIN (NITROSTAT) 0.4 MG SL tablet PLACE 1 TABLET UNDER THE TONGUE EVERY 5 (FIVE) MINUTES X 3 DOSES AS NEEDED FOR CHEST PAIN. 25 tablet 3  . pantoprazole (PROTONIX) 40 MG tablet Take 40 mg by mouth daily as needed.    . testosterone cypionate (DEPO-TESTOSTERONE) 200 MG/ML injection Inject 0.5 mLs (100 mg total) into the muscle every 7 (seven) days. 10 mL 1  . TURMERIC PO Take 500 mg by mouth daily.     . vitamin C (ASCORBIC ACID) 500 MG tablet Take 500 mg by mouth daily.    Marland Kitchen VITAMIN E PO Take 400 mg by mouth daily.      No facility-administered medications prior to visit.    Allergies  Allergen Reactions  . Penicillins Itching and Rash    Other reaction(s): Other (See Comments) Has patient had a PCN reaction causing immediate rash, facial/tongue/throat swelling, SOB or lightheadedness with hypotension: No Has patient had a PCN reaction causing severe rash involving mucus membranes or skin necrosis: No Has patient had a PCN reaction that required hospitalization: No Has patient had a PCN reaction occurring within the last 10 years: No If all of the above answers are "NO", then may proceed with Cephalosporin use.    Review of Systems   Constitutional: Negative.   Eyes: Positive for visual disturbance.       Harder to see at night; had corrective surgery several years ago and did not follow-up  Respiratory: Negative.   Cardiovascular: Negative.   Musculoskeletal:       Right foot pain  Psychiatric/Behavioral: Negative.        Objective:    Physical Exam Constitutional:      Appearance: Normal appearance.  Eyes:     Conjunctiva/sclera: Conjunctivae normal.     Pupils: Pupils are  equal, round, and reactive to light.  Cardiovascular:     Rate and Rhythm: Normal rate and regular rhythm.     Pulses: Normal pulses.     Heart sounds: Normal heart sounds.  Pulmonary:     Effort: Pulmonary effort is normal.     Breath sounds: Normal breath sounds.  Neurological:     General: No focal deficit present.     Mental Status: He is alert and oriented to person, place, and time.  Psychiatric:        Mood and Affect: Mood normal.        Behavior: Behavior normal.        Thought Content: Thought content normal.        Judgment: Judgment normal.     BP 129/86   Pulse 65   Temp 97.6 F (36.4 C)   Resp 18   Ht '5\' 6"'  (1.676 m)   Wt 194 lb (88 kg)   SpO2 95%   BMI 31.31 kg/m  Wt Readings from Last 3 Encounters:  07/18/20 194 lb (88 kg)  06/06/20 194 lb (88 kg)  05/04/20 194 lb (88 kg)    Health Maintenance Due  Topic Date Due  . TETANUS/TDAP  Never done  . COLONOSCOPY (Pts 45-20yr Insurance coverage will need to be confirmed)  Never done  . COVID-19 Vaccine (3 - Booster for Pfizer series) 02/05/2020  . PNA vac Low Risk Adult (1 of 2 - PCV13) Never done    There are no preventive care reminders to display for this patient.   Lab Results  Component Value Date   TSH 1.02 06/30/2019   Lab Results  Component Value Date   WBC 8.1 06/06/2020   HGB 19.7 (H) 06/06/2020   HCT 58.7 (H) 06/06/2020   MCV 95 06/06/2020   PLT 260 06/06/2020   Lab Results  Component Value Date   NA 134 06/06/2020   K 4.6  06/06/2020   CO2 18 (L) 06/06/2020   GLUCOSE 225 (H) 06/06/2020   BUN 10 06/06/2020   CREATININE 1.01 06/06/2020   BILITOT 1.1 06/06/2020   ALKPHOS 60 06/06/2020   AST 27 06/06/2020   ALT 31 06/06/2020   PROT 6.7 06/06/2020   ALBUMIN 4.1 06/06/2020   CALCIUM 9.3 06/06/2020   ANIONGAP 7 09/18/2017   GFR 89.73 08/31/2013   Lab Results  Component Value Date   CHOL 112 06/06/2020   Lab Results  Component Value Date   HDL 35 (L) 06/06/2020   Lab Results  Component Value Date   LDLCALC 50 06/06/2020   Lab Results  Component Value Date   TRIG 155 (H) 06/06/2020   Lab Results  Component Value Date   CHOLHDL 2.9 10/12/2019   Lab Results  Component Value Date   HGBA1C 6.0 (H) 06/06/2020       Assessment & Plan:   Problem List Items Addressed This Visit      Cardiovascular and Mediastinum   Hypertension    -BP well-controlled today      Relevant Orders   CBC with Differential/Platelet   CMP14+EGFR   Lipid Panel With LDL/HDL Ratio     Digestive   Colon polyp    -he was referred to GI at last OV, but declined to set up appointment [place]-he will call to set this up        Other   Hyperlipidemia    -will check labs prior to next visit  Relevant Orders   Lipid Panel With LDL/HDL Ratio   Vision changes    -referral to ophthalmology      Relevant Orders   Ambulatory referral to Ophthalmology   B12 deficiency    -B12 was elevated with last set of labs      Vertigo    -takes PRN meclizine -no issues today      Right foot pain    -referral to podiatry      Relevant Orders   Ambulatory referral to Podiatry    Other Visit Diagnoses    Hypogonadism in male    -  Primary   Relevant Orders   Testosterone, Free, Total, SHBG       No orders of the defined types were placed in this encounter.    Noreene Larsson, NP

## 2020-07-18 NOTE — Assessment & Plan Note (Signed)
-  referral to ophthalmology

## 2020-07-18 NOTE — Assessment & Plan Note (Signed)
-  B12 was elevated with last set of labs

## 2020-07-21 ENCOUNTER — Ambulatory Visit (AMBULATORY_SURGERY_CENTER): Payer: Self-pay

## 2020-07-21 ENCOUNTER — Other Ambulatory Visit: Payer: Self-pay

## 2020-07-21 ENCOUNTER — Other Ambulatory Visit: Payer: 59

## 2020-07-21 VITALS — Ht 66.0 in | Wt 193.0 lb

## 2020-07-21 DIAGNOSIS — Z86010 Personal history of colonic polyps: Secondary | ICD-10-CM

## 2020-07-21 MED ORDER — NA SULFATE-K SULFATE-MG SULF 17.5-3.13-1.6 GM/177ML PO SOLN: 1.0000 | Freq: Once | ORAL | 0 refills | Status: AC

## 2020-07-21 NOTE — Progress Notes (Signed)
No egg or soy allergy known to patient  No issues with past sedation with any surgeries or procedures Patient denies ever being told they had issues or difficulty with intubation  No FH of Malignant Hyperthermia No diet pills per patient No home 02 use per patient  No blood thinners per patient  Pt denies issues with constipation  No A fib or A flutter  EMMI video via MyChart  COVID 19 guidelines implemented in [place] today with Pt and RN   Coupon given to pt in [place] today, Code to Pharmacy and NO PA's for preps discussed with pt in [facility] today  Discussed with pt there will be an out-of-pocket cost for prep and that varies from $0 to 70 dollars   Due to the COVID-19 pandemic we are asking patients to follow certain guidelines.  Pt aware of COVID protocols and [facility] guidelines

## 2020-07-22 ENCOUNTER — Ambulatory Visit (INDEPENDENT_AMBULATORY_CARE_PROVIDER_SITE_OTHER): Payer: Medicare Other

## 2020-07-22 ENCOUNTER — Ambulatory Visit (INDEPENDENT_AMBULATORY_CARE_PROVIDER_SITE_OTHER): Payer: Medicare Other | Admitting: Podiatry

## 2020-07-22 DIAGNOSIS — M7750 Other enthesopathy of unspecified foot: Secondary | ICD-10-CM

## 2020-07-22 DIAGNOSIS — M7751 Other enthesopathy of right foot: Secondary | ICD-10-CM | POA: Diagnosis not present

## 2020-07-22 DIAGNOSIS — M2021 Hallux rigidus, right foot: Secondary | ICD-10-CM | POA: Diagnosis not present

## 2020-07-22 MED ORDER — BETAMETHASONE SOD PHOS & ACET 6 (3-3) MG/ML IJ SUSP: 9.0000 mg | Freq: Once | INTRAMUSCULAR | Status: AC

## 2020-07-22 NOTE — Progress Notes (Signed)
  Subjective:  Patient ID: Scott Roth, male    DOB: [birth year],  MRN: [id number]  Chief Complaint  Patient presents with  . Foot Pain    Right foot pain   65 y.o. male presents with the above complaint. History confirmed with patient. States the pain has been for about 3 months, has a hx as a Technical sales engineer and thinks that he has injured it several times over the years.  Objective:  Physical Exam: warm, good capillary refill, no trophic changes or ulcerative lesions, normal DP and PT pulses and normal sensory exam.  Right Foot: Pain on ROM right 1st MPJ with crepitus on ROM.  No images are attached to the encounter.  Radiographs: X-ray of the right foot: degenerative changes and subchondral sclerosis  of the 1st metatarsophalangeal joint joint, of the dorsal exostosis 1st met. Joint space narrowing. Assessment:   1. Tendonitis of ankle or foot   2. Hallux rigidus of right foot    Plan:  Patient was evaluated and treated and all questions answered.  Arthritis 1st MPJ right -Educated on etiology -XR reviewed with patient -Injection delivered to the painful joint -Consider surgery in the future should pain persist  Procedure: Joint Injection Location: Right 1s MPJ joint Skin Prep: Alcohol. Injectate: 0.5 cc 1% lidocaine plain, 1.5 cc betamethasone acetate-betamethasone sodium phosphate Disposition: Patient tolerated procedure well. Injection site dressed with a band-aid.  Return in about 1 month (around 08/21/2020) for Left, Arthritis.

## 2020-07-28 ENCOUNTER — Telehealth: Payer: Self-pay

## 2020-07-28 NOTE — Telephone Encounter (Signed)
Called pt to remind him of his procedure scheduled on April 20th at 1030 with Dr. Ardis Hughs. Pt stated that he needed to reschedule due to conflict with his wife schedule and she is his care partner. He stated that he will call [place] back to reschedule.

## 2020-07-30 ENCOUNTER — Other Ambulatory Visit (INDEPENDENT_AMBULATORY_CARE_PROVIDER_SITE_OTHER): Payer: Self-pay | Admitting: Internal Medicine

## 2020-08-01 ENCOUNTER — Telehealth: Payer: Self-pay

## 2020-08-01 ENCOUNTER — Encounter: Payer: 59 | Admitting: Gastroenterology

## 2020-08-01 NOTE — Telephone Encounter (Signed)
Pt has not came to get labs drawn. Per last AVS he was to come get labs before refills.

## 2020-08-01 NOTE — Telephone Encounter (Signed)
Patient called need refill for his medicine. Need 3 month supply its cheaper.    testosterone cypionate (DEPO-TESTOSTERONE) 200 MG/ML injection  Pharmacy: [facility]

## 2020-08-03 ENCOUNTER — Telehealth: Payer: Self-pay

## 2020-08-03 ENCOUNTER — Encounter (INDEPENDENT_AMBULATORY_CARE_PROVIDER_SITE_OTHER): Payer: Self-pay | Admitting: *Deleted

## 2020-08-03 ENCOUNTER — Encounter: Payer: 59 | Admitting: Gastroenterology

## 2020-08-03 ENCOUNTER — Ambulatory Visit (INDEPENDENT_AMBULATORY_CARE_PROVIDER_SITE_OTHER): Payer: 59 | Admitting: Internal Medicine

## 2020-08-03 NOTE — Telephone Encounter (Signed)
Error

## 2020-08-04 NOTE — Progress Notes (Signed)
His testosterone levels are elevated, so his RBC and Hgb are also elevated. His Hgb is higher than previous.  I would recommend that he stop taking testosterone injections. With his hemoglobin being so high, he is at a higher risk of heart attack or stroke.

## 2020-08-05 LAB — CMP14+EGFR
ALT: 29 IU/L (ref 0–44)
AST: 23 IU/L (ref 0–40)
Albumin/Globulin Ratio: 1.6 (ref 1.2–2.2)
Albumin: 4.2 g/dL (ref 3.8–4.8)
Alkaline Phosphatase: 60 IU/L (ref 44–121)
BUN/Creatinine Ratio: 13 (ref 10–24)
BUN: 12 mg/dL (ref 8–27)
Bilirubin Total: 0.8 mg/dL (ref 0.0–1.2)
CO2: 19 mmol/L — ABNORMAL LOW (ref 20–29)
Calcium: 9 mg/dL (ref 8.6–10.2)
Chloride: 102 mmol/L (ref 96–106)
Creatinine, Ser: 0.95 mg/dL (ref 0.76–1.27)
Globulin, Total: 2.6 g/dL (ref 1.5–4.5)
Glucose: 113 mg/dL — ABNORMAL HIGH (ref 65–99)
Potassium: 4.8 mmol/L (ref 3.5–5.2)
Sodium: 137 mmol/L (ref 134–144)
Total Protein: 6.8 g/dL (ref 6.0–8.5)
eGFR: 89 mL/min/{1.73_m2} (ref >59)

## 2020-08-05 LAB — CBC WITH DIFFERENTIAL/PLATELET
Basophils Absolute: 0.1 10*3/uL (ref 0.0–0.2)
Basos: 1 %
EOS (ABSOLUTE): 0.2 10*3/uL (ref 0.0–0.4)
Eos: 3 %
Hematocrit: 59.2 % — ABNORMAL HIGH (ref 37.5–51.0)
Hemoglobin: 20 g/dL (ref 13.0–17.7)
Immature Grans (Abs): 0 10*3/uL (ref 0.0–0.1)
Immature Granulocytes: 0 %
Lymphocytes Absolute: 1.6 10*3/uL (ref 0.7–3.1)
Lymphs: 23 %
MCH: 32.2 pg (ref 26.6–33.0)
MCHC: 33.8 g/dL (ref 31.5–35.7)
MCV: 95 fL (ref 79–97)
Monocytes Absolute: 0.9 10*3/uL (ref 0.1–0.9)
Monocytes: 12 %
Neutrophils Absolute: 4.4 10*3/uL (ref 1.4–7.0)
Neutrophils: 61 %
Platelets: 225 10*3/uL (ref 150–450)
RBC: 6.22 x10E6/uL — ABNORMAL HIGH (ref 4.14–5.80)
RDW: 13.6 % (ref 11.6–15.4)
WBC: 7.1 10*3/uL (ref 3.4–10.8)

## 2020-08-05 LAB — LIPID PANEL WITH LDL/HDL RATIO
Cholesterol, Total: 127 mg/dL (ref 100–199)
HDL: 37 mg/dL — ABNORMAL LOW (ref >39)
LDL Chol Calc (NIH): 65 mg/dL (ref 0–99)
LDL/HDL Ratio: 1.8 ratio (ref 0.0–3.6)
Triglycerides: 141 mg/dL (ref 0–149)
VLDL Cholesterol Cal: 25 mg/dL (ref 5–40)

## 2020-08-05 LAB — TESTOSTERONE, FREE, TOTAL, SHBG
Sex Hormone Binding: 44.7 nmol/L (ref 19.3–76.4)
Testosterone, Free: 18.5 pg/mL — ABNORMAL HIGH (ref 6.6–18.1)
Testosterone: 1207 ng/dL — ABNORMAL HIGH (ref 264–916)

## 2020-10-06 ENCOUNTER — Encounter (INDEPENDENT_AMBULATORY_CARE_PROVIDER_SITE_OTHER): Payer: Self-pay

## 2020-10-06 ENCOUNTER — Other Ambulatory Visit (INDEPENDENT_AMBULATORY_CARE_PROVIDER_SITE_OTHER): Payer: Self-pay

## 2020-10-06 DIAGNOSIS — Z01812 Encounter for preprocedural laboratory examination: Secondary | ICD-10-CM

## 2020-10-06 DIAGNOSIS — Z1211 Encounter for screening for malignant neoplasm of colon: Secondary | ICD-10-CM

## 2020-11-01 ENCOUNTER — Encounter: Payer: 59 | Admitting: Gastroenterology

## 2020-11-10 ENCOUNTER — Encounter (HOSPITAL_COMMUNITY)
Admission: RE | Disposition: A | Discharge status: Home / Self Care | Payer: Self-pay | Source: Home / Self Care | Attending: Internal Medicine

## 2020-11-10 ENCOUNTER — Encounter (HOSPITAL_COMMUNITY): Payer: Self-pay | Admitting: Internal Medicine

## 2020-11-10 ENCOUNTER — Other Ambulatory Visit: Payer: Self-pay

## 2020-11-10 ENCOUNTER — Ambulatory Visit (HOSPITAL_COMMUNITY)
Admission: RE | Admit: 2020-11-10 | Discharge: 2020-11-10 | Disposition: A | Discharge status: Home / Self Care | Payer: Medicare Other | Attending: Internal Medicine | Admitting: Internal Medicine

## 2020-11-10 DIAGNOSIS — K5730 Diverticulosis of large intestine without perforation or abscess without bleeding: Secondary | ICD-10-CM | POA: Diagnosis not present

## 2020-11-10 DIAGNOSIS — Z951 Presence of aortocoronary bypass graft: Secondary | ICD-10-CM | POA: Insufficient documentation

## 2020-11-10 DIAGNOSIS — Z8249 Family history of ischemic heart disease and other diseases of the circulatory system: Secondary | ICD-10-CM | POA: Insufficient documentation

## 2020-11-10 DIAGNOSIS — I110 Hypertensive heart disease with heart failure: Secondary | ICD-10-CM | POA: Insufficient documentation

## 2020-11-10 DIAGNOSIS — I5022 Chronic systolic (congestive) heart failure: Secondary | ICD-10-CM | POA: Insufficient documentation

## 2020-11-10 DIAGNOSIS — Z79899 Other long term (current) drug therapy: Secondary | ICD-10-CM | POA: Insufficient documentation

## 2020-11-10 DIAGNOSIS — Z880 Allergy status to penicillin: Secondary | ICD-10-CM | POA: Diagnosis not present

## 2020-11-10 DIAGNOSIS — D123 Benign neoplasm of transverse colon: Secondary | ICD-10-CM | POA: Insufficient documentation

## 2020-11-10 DIAGNOSIS — K644 Residual hemorrhoidal skin tags: Secondary | ICD-10-CM | POA: Insufficient documentation

## 2020-11-10 DIAGNOSIS — Z1211 Encounter for screening for malignant neoplasm of colon: Secondary | ICD-10-CM

## 2020-11-10 HISTORY — PX: COLONOSCOPY: SHX5424

## 2020-11-10 HISTORY — DX: Pure hypercholesterolemia, unspecified: E78.00

## 2020-11-10 HISTORY — PX: POLYPECTOMY: SHX5525

## 2020-11-10 LAB — HM COLONOSCOPY

## 2020-11-10 SURGERY — COLONOSCOPY
Anesthesia: Moderate Sedation

## 2020-11-10 MED ORDER — MEPERIDINE HCL 50 MG/ML IJ SOLN
INTRAMUSCULAR | Status: DC | PRN
  Administered 2020-11-10 (×2): 25 mg via INTRAVENOUS

## 2020-11-10 MED ORDER — MEPERIDINE HCL 50 MG/ML IJ SOLN
INTRAMUSCULAR | Status: AC
  Filled 2020-11-10: qty 1

## 2020-11-10 MED ORDER — MIDAZOLAM HCL 5 MG/5ML IJ SOLN
INTRAMUSCULAR | Status: DC | PRN
  Administered 2020-11-10: 1 mg via INTRAVENOUS
  Administered 2020-11-10 (×2): 2 mg via INTRAVENOUS

## 2020-11-10 MED ORDER — ASPIRIN EC 81 MG PO TBEC: 81.0000 mg | DELAYED_RELEASE_TABLET | Freq: Every day | ORAL | Status: AC

## 2020-11-10 MED ORDER — MIDAZOLAM HCL 5 MG/5ML IJ SOLN
INTRAMUSCULAR | Status: AC
  Filled 2020-11-10: qty 10

## 2020-11-10 MED ORDER — STERILE WATER FOR IRRIGATION IR SOLN
Status: DC | PRN
  Administered 2020-11-10: 1.5 mL

## 2020-11-10 MED ORDER — SODIUM CHLORIDE 0.9 % IV SOLN: INTRAVENOUS | Status: DC

## 2020-11-10 NOTE — Op Note (Signed)
[facility] Patient Name: Scott Roth Procedure Date: 11/10/2020 10:46 AM MRN: [id number] Date of Birth: [birth year] Attending MD: Hildred Laser , MD CSN: [id number] Age: 65 Admit Type: Outpatient Procedure:                Colonoscopy Indications:              Screening for colorectal malignant neoplasm Providers:                Hildred Laser, MD, Gwenlyn Fudge, RN, Randa Spike, Technician Referring MD:             Noreene Larsson, NP Medicines:                Meperidine 50 mg IV, Midazolam 5 mg IV Complications:            No immediate complications. Estimated Blood Loss:     Estimated blood loss was minimal. Procedure:                Pre-Anesthesia Assessment:                           - Prior to the procedure, a History and Physical                            was performed, and patient medications and                            allergies were reviewed. The patient's tolerance of                            previous anesthesia was also reviewed. The risks                            and benefits of the procedure and the sedation                            options and risks were discussed with the patient.                            All questions were answered, and informed consent                            was obtained. Prior Anticoagulants: The patient has                            taken no previous anticoagulant or antiplatelet                            agents except for aspirin. ASA Grade Assessment:                            III - A patient with severe systemic disease. After  reviewing the risks and benefits, the patient was                            deemed in satisfactory condition to undergo the                            procedure.                           After obtaining informed consent, the colonoscope                            was passed under direct vision. Throughout the                             procedure, the patient's blood pressure, pulse, and                            oxygen saturations were monitored continuously. The                            [id number] scope was introduced through                            the anus and advanced to the the cecum, identified                            by appendiceal orifice and ileocecal valve. The                            colonoscopy was performed without difficulty. The                            patient tolerated the procedure well. The quality                            of the bowel preparation was adequate. The                            ileocecal valve, appendiceal orifice, and rectum                            were photographed. The ileocecal valve, appendiceal                            orifice, and rectum were photographed. Scope In: 11:12:43 AM Scope Out: 11:40:05 AM Scope Withdrawal Time: 0 hours 21 minutes 14 seconds  Total Procedure Duration: 0 hours 27 minutes 22 seconds  Findings:      Skin tags were found on perianal exam.      Three polyps were found in the splenic flexure and transverse colon. The       polyps were small in size. These polyps were removed with a cold snare.       Resection and retrieval were complete. The pathology specimen was placed  into Bottle Number 1.      Three polyps were found in the splenic flexure and transverse colon. The       polyps were diminutive in size. These were biopsied with a cold forceps       for histology. The pathology specimen was placed into Bottle Number 1.      Multiple diverticula were found in the sigmoid colon.      External hemorrhoids were found during retroflexion. The hemorrhoids       were small. Impression:               - Perianal skin tags found on perianal exam.                           - Three small polyps at the splenic flexure and in                            the transverse colon, removed with a cold snare.                             Resected and retrieved.                           - Three diminutive polyps at the splenic flexure                            and in the transverse colon. Biopsied.                           - Diverticulosis in the sigmoid colon.                           - External hemorrhoids. Moderate Sedation:      Moderate (conscious) sedation was administered by the endoscopy nurse       and supervised by the endoscopist. The following parameters were       monitored: oxygen saturation, heart rate, blood pressure, CO2       capnography and response to care. Total physician intraservice time was       33 minutes. Recommendation:           - Patient has a contact number available for                            emergencies. The signs and symptoms of potential                            delayed complications were discussed with the                            patient. Return to normal activities tomorrow.                            Written discharge instructions were provided to the                            patient.                           -  High fiber diet today.                           - Continue present medications.                           - No aspirin, ibuprofen, naproxen, or other                            non-steroidal anti-inflammatory drugs for 1 day.                           - Await pathology results.                           - Repeat colonoscopy is recommended. The                            colonoscopy date will be determined after pathology                            results from today's exam become available for                            review. Procedure Code(s):        --- Professional ---                           [phone], Colonoscopy, flexible; with removal of                            tumor(s), polyp(s), or other lesion(s) by snare                            technique                           45380, 59, Colonoscopy, flexible; with biopsy,                            single  or multiple                           99153, Moderate sedation; each additional 15                            minutes intraservice time                           G0500, Moderate sedation services provided by the                            same physician or other qualified health care                            professional performing a gastrointestinal  endoscopic service that sedation supports,                            requiring the presence of an independent trained                            observer to assist in the monitoring of the                            patient's level of consciousness and physiological                            status; initial 15 minutes of intra-service time;                            patient age 19 years or older (additional time may                            be reported with [phone], as appropriate) Diagnosis Code(s):        --- Professional ---                           K64.4, Residual hemorrhoidal skin tags                           Z12.11, Encounter for screening for malignant                            neoplasm of colon                           K63.5, Polyp of colon                           K57.30, Diverticulosis of large intestine without                            perforation or abscess without bleeding CPT copyright 2019 American Medical Association. All rights reserved. The codes documented in this report are preliminary and upon coder review may  be revised to meet current compliance requirements. Hildred Laser, MD Hildred Laser, MD 11/10/2020 11:52:33 AM This report has been signed electronically. Number of Addenda: 0

## 2020-11-10 NOTE — H&P (Signed)
Scott Roth is an 65 y.o. male.   Chief Complaint: Patient is here for colonoscopy. HPI: Patient is 65 year old male who is here for screening colonoscopy.  This is his second exam.  He denies abdominal pain change in bowel habits or rectal bleeding.  His appetite is good and his weight is stable.  He stopped aspirin 3 days ago.  He does not take anticoagulants. He states he is very active.  He does nones 50-70 push-ups daily. Family history is negative for CRC.  Past Medical History:  Diagnosis Date   Allergy    seasonal allergies   Arthritis    bilateral hands, knees, shoulders   CAD (coronary artery disease)    a. CABG x4 with LIMA to dLAD, free RIMA to OM1, SVG to D1, SVG to OM2 in 2006. b. PCI 2008 with notable ventricular fibrillation during cath) - DES to prox LAD/1st diagonal. c. Cath 07/25/17 demonstrated previously placed stents, patent LIMA-LAD, patent free RIMA-OM, occluded VG-diagonal and occluded Y graft-OM2, without significant change from prior, elevated LVEDP.   Cardiomyopathy ([place])    EF prev normal 2004, 2008 // EF 35% by cath in 07/2017 // Echo 8/19:  Mild conc LVH, EF 40-45, diff HK, Gr 1 DD, mild AI, mild MR, mod LAE, normal RVSF, mild RAE, mild TR   Chest pain with moderate risk of acute coronary syndrome [id number]   Chronic systolic CHF (congestive heart failure) ([place])    Erectile dysfunction    GERD (gastroesophageal reflux disease)    on meds   Hypercholesteremia    Hyperlipidemia    on meds   Hypertension    on meds   Sleep apnea    does not wear CPAP mask   Tubular adenoma     Past Surgical History:  Procedure Laterality Date   CARDIAC CATHETERIZATION     X 1 stent in 2008 due to heart valve collapse after CABG   CORONARY ARTERY BYPASS GRAFT  2008   x4 vessels   EYE SURGERY Bilateral    Lasik   KNEE ARTHROPLASTY Left    LEFT HEART CATH AND CORS/GRAFTS ANGIOGRAPHY N/A 07/25/2017   Procedure: LEFT HEART CATH AND CORS/GRAFTS ANGIOGRAPHY;   Surgeon: Lorretta Harp, MD;  Location: [facility];  Service: Cardiovascular;  Laterality: N/A;   SHOULDER SURGERY Right    TOTAL KNEE ARTHROPLASTY Right 08/25/2014   Procedure: RIGHT TOTAL KNEE ARTHROPLASTY;  Surgeon: Kathryne Hitch, MD;  Location: [place] [facility];  Service: Orthopedics;  Laterality: Right;    Family History  Problem Relation Age of Onset   Heart failure Father    Heart disease Father    Hypertension Mother    Colon polyps Neg Hx    Colon cancer Neg Hx    Esophageal cancer Neg Hx    Rectal cancer Neg Hx    Stomach cancer Neg Hx    Social History:  reports that he has never smoked. He has never used smokeless tobacco. He reports current alcohol use. He reports that he does not use drugs.  Allergies:  Allergies  Allergen Reactions   Penicillins Itching and Rash    Other reaction(s): Other (See Comments) Has patient had a PCN reaction causing immediate rash, facial/tongue/throat swelling, SOB or lightheadedness with hypotension: No Has patient had a PCN reaction causing severe rash involving mucus membranes or skin necrosis: No Has patient had a PCN reaction that required hospitalization: No Has patient had a PCN reaction occurring within the last 10 years:  No If all of the above answers are "NO", then may proceed with Cephalosporin use.    Facility-Administered Medications Prior to Admission  Medication Dose Route Frequency Provider Last Rate Last Admin   betamethasone acetate-betamethasone sodium phosphate (CELESTONE) injection 9 mg  9 mg Other Once Evelina Bucy, DPM       Medications Prior to Admission  Medication Sig Dispense Refill   B Complex-C (SUPER B COMPLEX PO) Take 1 capsule by mouth daily.     Cholecalciferol (VITAMIN D3) 125 MCG (5000 UT) CAPS Take 10,000 Units by mouth daily.      Coenzyme Q10 (COQ10) 100 MG CAPS Take 100 mg by mouth daily.     Cyanocobalamin (B-12 PO) Take 1 tablet by mouth daily.      ENTRESTO 97-103 MG TAKE 1 TABLET BY MOUTH  TWICE DAILY (Patient taking differently: Take 1 tablet by mouth 2 (two) times daily.) 180 tablet 3   furosemide (LASIX) 20 MG tablet Take 20 mg by mouth daily as needed for fluid.     GARLIC PO Take 1 tablet by mouth daily.     Ginkgo Biloba 40 MG TABS Take 40 mg by mouth daily.     meclizine (ANTIVERT) 25 MG tablet Take 1 tablet (25 mg total) by mouth 3 (three) times daily as needed for dizziness. 30 tablet 0   metoprolol succinate (TOPROL-XL) 25 MG 24 hr tablet TAKE 1 TABLET(25 MG) BY MOUTH DAILY (Patient taking differently: Take 25 mg by mouth daily.) 90 tablet 1   Multiple Vitamins-Minerals (MULTIVITAMIN PO) Take 1 tablet by mouth daily.      OVER THE COUNTER MEDICATION Take 1 capsule by mouth in the morning and at bedtime. Force Factor Prostate     pantoprazole (PROTONIX) 40 MG tablet Take 1 tablet (40 mg total) by mouth daily as needed. (Patient taking differently: Take 40 mg by mouth daily as needed (acid reflux).) 90 tablet 1   rosuvastatin (CRESTOR) 40 MG tablet Take 40 mg by mouth daily.     TURMERIC PO Take 500 mg by mouth daily.      vitamin C (ASCORBIC ACID) 500 MG tablet Take 500 mg by mouth daily.     VITAMIN E PO Take 400 mg by mouth daily.      nitroGLYCERIN (NITROSTAT) 0.4 MG SL tablet PLACE 1 TABLET UNDER THE TONGUE EVERY 5 (FIVE) MINUTES X 3 DOSES AS NEEDED FOR CHEST PAIN. (Patient not taking: Reported on 11/08/2020) 25 tablet 3   testosterone cypionate (DEPO-TESTOSTERONE) 200 MG/ML injection Inject 0.5 mLs (100 mg total) into the muscle every 7 (seven) days. (Patient not taking: Reported on 11/08/2020) 10 mL 1    No results found for this or any previous visit (from the past 48 hour(s)). No results found.  Review of Systems  Blood pressure 110/76, pulse 65, temperature (!) 97.5 F (36.4 C), temperature source Oral, resp. rate 14, height '5\' 7"'$  (1.702 m), weight 81.6 kg, SpO2 97 %. Physical Exam HENT:     Mouth/Throat:     Mouth: Mucous membranes are moist.     Pharynx:  Oropharynx is clear.  Eyes:     General: No scleral icterus.    Conjunctiva/sclera: Conjunctivae normal.  Cardiovascular:     Rate and Rhythm: Normal rate and regular rhythm.     Heart sounds: Normal heart sounds. No murmur heard. Pulmonary:     Effort: Pulmonary effort is normal.     Breath sounds: Normal breath sounds.  Musculoskeletal:  General: No swelling.     Cervical back: Neck supple.  Lymphadenopathy:     Cervical: No cervical adenopathy.  Skin:    General: Skin is warm and dry.  Neurological:     Mental Status: He is alert.     Assessment/Plan  Average risk screening colonoscopy.  Hildred Laser, MD 11/10/2020, 10:58 AM

## 2020-11-10 NOTE — Discharge Instructions (Signed)
Resume aspirin on 11/11/2020 Resume other medications as before High-fiber diet No driving for 24 hours Physician will call with biopsy results.

## 2020-11-11 LAB — SURGICAL PATHOLOGY

## 2020-11-14 ENCOUNTER — Encounter: Payer: Self-pay | Admitting: Nurse Practitioner

## 2020-11-14 ENCOUNTER — Other Ambulatory Visit: Payer: Self-pay

## 2020-11-14 ENCOUNTER — Ambulatory Visit (INDEPENDENT_AMBULATORY_CARE_PROVIDER_SITE_OTHER): Payer: Medicare Other | Admitting: Nurse Practitioner

## 2020-11-14 VITALS — BP 107/70 | HR 67 | Temp 97.6°F | Ht 67.0 in | Wt 190.0 lb

## 2020-11-14 DIAGNOSIS — R4189 Other symptoms and signs involving cognitive functions and awareness: Secondary | ICD-10-CM

## 2020-11-14 DIAGNOSIS — R1032 Left lower quadrant pain: Secondary | ICD-10-CM | POA: Insufficient documentation

## 2020-11-14 DIAGNOSIS — I5022 Chronic systolic (congestive) heart failure: Secondary | ICD-10-CM | POA: Diagnosis not present

## 2020-11-14 DIAGNOSIS — M545 Low back pain, unspecified: Secondary | ICD-10-CM

## 2020-11-14 DIAGNOSIS — R4689 Other symptoms and signs involving appearance and behavior: Secondary | ICD-10-CM

## 2020-11-14 DIAGNOSIS — E559 Vitamin D deficiency, unspecified: Secondary | ICD-10-CM

## 2020-11-14 DIAGNOSIS — I10 Essential (primary) hypertension: Secondary | ICD-10-CM

## 2020-11-14 DIAGNOSIS — G8929 Other chronic pain: Secondary | ICD-10-CM

## 2020-11-14 DIAGNOSIS — E782 Mixed hyperlipidemia: Secondary | ICD-10-CM

## 2020-11-14 NOTE — Assessment & Plan Note (Addendum)
-  Decrease in short-term memory -MMSE today was 30/30

## 2020-11-14 NOTE — Patient Instructions (Signed)
Please have fasting labs drawn today. 

## 2020-11-14 NOTE — Progress Notes (Signed)
Established Patient Office Visit  Subjective:  Patient ID: Scott Roth, male    DOB: 04/18/1955  Age: 65 y.o. MRN: [id number]  CC:  Chief Complaint  Patient presents with   Follow-up    HPI Scott Roth presents for lab follow-up. He didn't have labs drawn prior to this visit.   ? Hernia. He has left inguinal pain with lifting.  He has lower back pain. He fell and landed on bottom of a pool and had trouble using his legs in Dec 2017. Since then, he has had back pain. On both sides of his lower back.  He has stiffness when waking up.  He states that he is forgetting some things that occurred recently. His father and mother both had Alzheimer's in their 86s.  Past Medical History:  Diagnosis Date   Allergy    seasonal allergies   Arthritis    bilateral hands, knees, shoulders   CAD (coronary artery disease)    a. CABG x4 with LIMA to dLAD, free RIMA to OM1, SVG to D1, SVG to OM2 in 2006. b. PCI 2008 with notable ventricular fibrillation during cath) - DES to prox LAD/1st diagonal. c. Cath 07/25/17 demonstrated previously placed stents, patent LIMA-LAD, patent free RIMA-OM, occluded VG-diagonal and occluded Y graft-OM2, without significant change from prior, elevated LVEDP.   Cardiomyopathy ([place])    EF prev normal 2004, 2008 // EF 35% by cath in 07/2017 // Echo 8/19:  Mild conc LVH, EF 40-45, diff HK, Gr 1 DD, mild AI, mild MR, mod LAE, normal RVSF, mild RAE, mild TR   Chest pain with moderate risk of acute coronary syndrome 67/59/1638   Chronic systolic CHF (congestive heart failure) ([place])    Erectile dysfunction    GERD (gastroesophageal reflux disease)    on meds   Hypercholesteremia    Hyperlipidemia    on meds   Hypertension    on meds   Sleep apnea    does not wear CPAP mask   Tubular adenoma     Past Surgical History:  Procedure Laterality Date   CARDIAC CATHETERIZATION     X 1 stent in 2008 due to heart valve collapse after CABG   CORONARY ARTERY  BYPASS GRAFT  2008   x4 vessels   EYE SURGERY Bilateral    Lasik   KNEE ARTHROPLASTY Left    LEFT HEART CATH AND CORS/GRAFTS ANGIOGRAPHY N/A 07/25/2017   Procedure: LEFT HEART CATH AND CORS/GRAFTS ANGIOGRAPHY;  Surgeon: Lorretta Harp, MD;  Location: [facility];  Service: Cardiovascular;  Laterality: N/A;   SHOULDER SURGERY Right    TOTAL KNEE ARTHROPLASTY Right 08/25/2014   Procedure: RIGHT TOTAL KNEE ARTHROPLASTY;  Surgeon: Kathryne Hitch, MD;  Location: [facility];  Service: Orthopedics;  Laterality: Right;    Family History  Problem Relation Age of Onset   Heart failure Father    Heart disease Father    Hypertension Mother    Colon polyps Neg Hx    Colon cancer Neg Hx    Esophageal cancer Neg Hx    Rectal cancer Neg Hx    Stomach cancer Neg Hx     Social History   Socioeconomic History   Marital status: Married    Spouse name: Not on file   Number of children: Not on file   Years of education: Not on file   Highest education level: Not on file  Occupational History   Occupation: Farming    Comment: owned multiple  businesses; owns a bar, Barrister's clerk  Tobacco Use   Smoking status: Never   Smokeless tobacco: Never  Vaping Use   Vaping Use: Never used  Substance and Sexual Activity   Alcohol use: Yes    Comment: 1-2 times per month   Drug use: No   Sexual activity: Yes    Partners: Female  Other Topics Concern   Not on file  Social History Narrative   Married for 2 years.Lives with wife and step daughter.Originally born [place].Retired,business entrepeuer-owns several companies.   Social Determinants of Health   Financial Resource Strain: Not on file  Food Insecurity: Not on file  Transportation Needs: Not on file  Physical Activity: Not on file  Stress: Not on file  Social Connections: Not on file  Intimate Partner Violence: Not on file    Outpatient Medications Prior to Visit  Medication Sig Dispense Refill   aspirin EC 81 MG  tablet Take 1 tablet (81 mg total) by mouth daily. Swallow whole.     B Complex-C (SUPER B COMPLEX PO) Take 1 capsule by mouth daily.     Cholecalciferol (VITAMIN D3) 125 MCG (5000 UT) CAPS Take 10,000 Units by mouth daily.      Coenzyme Q10 (COQ10) 100 MG CAPS Take 100 mg by mouth daily.     Cyanocobalamin (B-12 PO) Take 1 tablet by mouth daily.      ENTRESTO 97-103 MG TAKE 1 TABLET BY MOUTH TWICE DAILY 297 tablet 3   GARLIC PO Take 1 tablet by mouth daily.     Ginkgo Biloba 40 MG TABS Take 40 mg by mouth daily.     meclizine (ANTIVERT) 25 MG tablet Take 1 tablet (25 mg total) by mouth 3 (three) times daily as needed for dizziness. 30 tablet 0   metoprolol succinate (TOPROL-XL) 25 MG 24 hr tablet TAKE 1 TABLET(25 MG) BY MOUTH DAILY 90 tablet 1   Multiple Vitamins-Minerals (MULTIVITAMIN PO) Take 1 tablet by mouth daily.      nitroGLYCERIN (NITROSTAT) 0.4 MG SL tablet PLACE 1 TABLET UNDER THE TONGUE EVERY 5 (FIVE) MINUTES X 3 DOSES AS NEEDED FOR CHEST PAIN. 25 tablet 3   OVER THE COUNTER MEDICATION Take 1 capsule by mouth in the morning and at bedtime. Force Factor Prostate     pantoprazole (PROTONIX) 40 MG tablet Take 1 tablet (40 mg total) by mouth daily as needed. 90 tablet 1   rosuvastatin (CRESTOR) 40 MG tablet Take 40 mg by mouth daily.     TURMERIC PO Take 500 mg by mouth daily.      UNABLE TO FIND Med Name: Sal Palmetto takes PO BID.     vitamin C (ASCORBIC ACID) 500 MG tablet Take 500 mg by mouth daily.     VITAMIN E PO Take 400 mg by mouth daily.      furosemide (LASIX) 20 MG tablet Take 20 mg by mouth daily as needed for fluid. (Patient not taking: Reported on 11/14/2020)     Facility-Administered Medications Prior to Visit  Medication Dose Route Frequency Provider Last Rate Last Admin   betamethasone acetate-betamethasone sodium phosphate (CELESTONE) injection 9 mg  9 mg Other Once Evelina Bucy, DPM        Allergies  Allergen Reactions   Penicillins Itching and Rash    Other  reaction(s): Other (See Comments) Has patient had a PCN reaction causing immediate rash, facial/tongue/throat swelling, SOB or lightheadedness with hypotension: No Has patient had a PCN reaction causing  severe rash involving mucus membranes or skin necrosis: No Has patient had a PCN reaction that required hospitalization: No Has patient had a PCN reaction occurring within the last 10 years: No If all of the above answers are "NO", then may proceed with Cephalosporin use.    ROS Review of Systems  Constitutional: Negative.   Respiratory: Negative.    Cardiovascular: Negative.   Musculoskeletal:  Positive for arthralgias and back pain.       Inguinal pain after lifting  Neurological:        Short term memory loss (mild)  Psychiatric/Behavioral: Negative.       Objective:    Physical Exam Exam conducted with a chaperone present Gerald Stabs with pharmacy).  Constitutional:      Appearance: Normal appearance.  Cardiovascular:     Rate and Rhythm: Normal rate and regular rhythm.     Pulses: Normal pulses.     Heart sounds: Normal heart sounds.  Pulmonary:     Effort: Pulmonary effort is normal.     Breath sounds: Normal breath sounds.  Genitourinary:    Penis: Normal.      Testes: Normal.     Comments: -no protrusion from left inguinal canal felt on exam today Neurological:     Mental Status: He is alert.    BP 107/70 (BP Location: Left Arm, Patient Position: Sitting, Cuff Size: Large)   Pulse 67   Temp 97.6 F (36.4 C) (Temporal)   Ht _0  (1.702 m)   Wt 190 lb (86.2 kg)   SpO2 95%   BMI 29.76 kg/m  Wt Readings from Last 3 Encounters:  11/14/20 190 lb (86.2 kg)  11/10/20 180 lb (81.6 kg)  07/21/20 193 lb (87.5 kg)     Health Maintenance Due  Topic Date Due   Zoster Vaccines- Shingrix (1 of 2) Never done   PNA vac Low Risk Adult (1 of 2 - PCV13) Never done   INFLUENZA VACCINE  11/14/2020    There are no preventive care reminders to display for this  patient.  Lab Results  Component Value Date   TSH 1.02 06/30/2019   Lab Results  Component Value Date   WBC 7.1 08/03/2020   HGB 20.0 (HH) 08/03/2020   HCT 59.2 (H) 08/03/2020   MCV 95 08/03/2020   PLT 225 08/03/2020   Lab Results  Component Value Date   NA 137 08/03/2020   K 4.8 08/03/2020   CO2 19 (L) 08/03/2020   GLUCOSE 113 (H) 08/03/2020   BUN 12 08/03/2020   CREATININE 0.95 08/03/2020   BILITOT 0.8 08/03/2020   ALKPHOS 60 08/03/2020   AST 23 08/03/2020   ALT 29 08/03/2020   PROT 6.8 08/03/2020   ALBUMIN 4.2 08/03/2020   CALCIUM 9.0 08/03/2020   ANIONGAP 7 09/18/2017   EGFR 89 08/03/2020   GFR 89.73 08/31/2013   Lab Results  Component Value Date   CHOL 127 08/03/2020   Lab Results  Component Value Date   HDL 37 (L) 08/03/2020   Lab Results  Component Value Date   LDLCALC 65 08/03/2020   Lab Results  Component Value Date   TRIG 141 08/03/2020   Lab Results  Component Value Date   CHOLHDL 2.9 10/12/2019   Lab Results  Component Value Date   HGBA1C 6.0 (H) 06/06/2020      Assessment & Plan:   Problem List Items Addressed This Visit       Cardiovascular and Mediastinum   Hypertension  BP Readings from Last 3 Encounters:  11/14/20 107/70  11/10/20 93/65  07/18/20 129/86  -well controlled      Relevant Orders   CBC with Differential/Platelet   TDH74+BULA   Chronic systolic CHF (congestive heart failure) ([place])    -taking entresto -had recent w/u; LVEF 50% -no recent cardiology f/u         Other   Hyperlipidemia    Lab Results  Component Value Date   CHOL 127 08/03/2020   HDL 37 (L) 08/03/2020   LDLCALC 65 08/03/2020   TRIG 141 08/03/2020   CHOLHDL 2.9 10/12/2019  -checking lipid panel      Relevant Orders   Lipid Panel With LDL/HDL Ratio   Vitamin D deficiency    -takes large quantity of Vit D -will check labs       Relevant Orders   VITAMIN D 25 Hydroxy (Vit-D Deficiency, Fractures)   Cognitive and behavioral  changes    -Decrease in short-term memory -MMSE today was 30/30       Left groin pain    -has pain with lifting; otherwise no pain -will get scrotal U/S to r/o left inguinal hernia       Relevant Orders   US Scrotum   PSA   Other Visit Diagnoses     Chronic midline low back pain without sciatica    -  Primary   Relevant Orders   Ambulatory referral to Pain Clinic   PSA       No orders of the defined types were placed in this encounter.   Follow-up: Return in about 4 months (around 03/16/2021) for Lab follow-up (HTN, HLD).    Noreene Larsson, NP

## 2020-11-14 NOTE — Assessment & Plan Note (Signed)
BP Readings from Last 3 Encounters:  11/14/20 107/70  11/10/20 93/65  07/18/20 129/86   -well controlled

## 2020-11-14 NOTE — Assessment & Plan Note (Signed)
-  takes large quantity of Vit D -will check labs

## 2020-11-14 NOTE — Assessment & Plan Note (Signed)
Lab Results  Component Value Date   CHOL 127 08/03/2020   HDL 37 (L) 08/03/2020   LDLCALC 65 08/03/2020   TRIG 141 08/03/2020   CHOLHDL 2.9 10/12/2019   -checking lipid panel

## 2020-11-14 NOTE — Assessment & Plan Note (Signed)
-  taking entresto -had recent w/u; LVEF 50% -no recent cardiology f/u

## 2020-11-14 NOTE — Assessment & Plan Note (Signed)
-  has pain with lifting; otherwise no pain -will get scrotal U/S to r/o left inguinal hernia

## 2020-11-15 DIAGNOSIS — R7301 Impaired fasting glucose: Secondary | ICD-10-CM | POA: Diagnosis not present

## 2020-11-15 DIAGNOSIS — G8929 Other chronic pain: Secondary | ICD-10-CM | POA: Diagnosis not present

## 2020-11-15 DIAGNOSIS — E782 Mixed hyperlipidemia: Secondary | ICD-10-CM | POA: Diagnosis not present

## 2020-11-15 DIAGNOSIS — I10 Essential (primary) hypertension: Secondary | ICD-10-CM | POA: Diagnosis not present

## 2020-11-15 DIAGNOSIS — M545 Low back pain, unspecified: Secondary | ICD-10-CM | POA: Diagnosis not present

## 2020-11-16 LAB — CBC WITH DIFFERENTIAL/PLATELET
Basophils Absolute: 0.1 10*3/uL (ref 0.0–0.2)
Basos: 1 %
EOS (ABSOLUTE): 0.2 10*3/uL (ref 0.0–0.4)
Eos: 3 %
Hematocrit: 47 % (ref 37.5–51.0)
Hemoglobin: 15.8 g/dL (ref 13.0–17.7)
Immature Grans (Abs): 0 10*3/uL (ref 0.0–0.1)
Immature Granulocytes: 0 %
Lymphocytes Absolute: 2.2 10*3/uL (ref 0.7–3.1)
Lymphs: 31 %
MCH: 32.6 pg (ref 26.6–33.0)
MCHC: 33.6 g/dL (ref 31.5–35.7)
MCV: 97 fL (ref 79–97)
Monocytes Absolute: 0.7 10*3/uL (ref 0.1–0.9)
Monocytes: 10 %
Neutrophils Absolute: 3.8 10*3/uL (ref 1.4–7.0)
Neutrophils: 55 %
Platelets: 274 10*3/uL (ref 150–450)
RBC: 4.85 x10E6/uL (ref 4.14–5.80)
RDW: 14.2 % (ref 11.6–15.4)
WBC: 6.9 10*3/uL (ref 3.4–10.8)

## 2020-11-16 LAB — LIPID PANEL WITH LDL/HDL RATIO
Cholesterol, Total: 148 mg/dL (ref 100–199)
HDL: 45 mg/dL (ref >39)
LDL Chol Calc (NIH): 71 mg/dL (ref 0–99)
LDL/HDL Ratio: 1.6 ratio (ref 0.0–3.6)
Triglycerides: 193 mg/dL — ABNORMAL HIGH (ref 0–149)
VLDL Cholesterol Cal: 32 mg/dL (ref 5–40)

## 2020-11-16 LAB — CMP14+EGFR
ALT: 19 IU/L (ref 0–44)
AST: 19 IU/L (ref 0–40)
Albumin/Globulin Ratio: 1.7 (ref 1.2–2.2)
Albumin: 4.3 g/dL (ref 3.8–4.8)
Alkaline Phosphatase: 66 IU/L (ref 44–121)
BUN/Creatinine Ratio: 21 (ref 10–24)
BUN: 20 mg/dL (ref 8–27)
Bilirubin Total: 0.6 mg/dL (ref 0.0–1.2)
CO2: 18 mmol/L — ABNORMAL LOW (ref 20–29)
Calcium: 9.3 mg/dL (ref 8.6–10.2)
Chloride: 105 mmol/L (ref 96–106)
Creatinine, Ser: 0.96 mg/dL (ref 0.76–1.27)
Globulin, Total: 2.6 g/dL (ref 1.5–4.5)
Glucose: 108 mg/dL — ABNORMAL HIGH (ref 65–99)
Potassium: 4.5 mmol/L (ref 3.5–5.2)
Sodium: 140 mmol/L (ref 134–144)
Total Protein: 6.9 g/dL (ref 6.0–8.5)
eGFR: 88 mL/min/{1.73_m2} (ref >59)

## 2020-11-16 LAB — PSA: Prostate Specific Ag, Serum: 0.4 ng/mL (ref 0.0–4.0)

## 2020-11-16 LAB — VITAMIN D 25 HYDROXY (VIT D DEFICIENCY, FRACTURES): Vit D, 25-Hydroxy: 30.7 ng/mL (ref 30.0–100.0)

## 2020-11-16 NOTE — Progress Notes (Signed)
RBC and hemoglobin are back in the normal limits without testosterone. Blood sugar was slightly elevated, but lower than previous. I'm adding an A1c to your labs to screen for DM. Triglycerides are slightly elevated, and 3g of fish oil per day can help that.

## 2020-11-17 ENCOUNTER — Other Ambulatory Visit: Payer: Self-pay

## 2020-11-17 DIAGNOSIS — R1032 Left lower quadrant pain: Secondary | ICD-10-CM

## 2020-11-18 ENCOUNTER — Encounter (INDEPENDENT_AMBULATORY_CARE_PROVIDER_SITE_OTHER): Payer: Self-pay | Admitting: *Deleted

## 2020-11-18 ENCOUNTER — Encounter: Payer: Self-pay | Admitting: Physical Medicine and Rehabilitation

## 2020-11-18 LAB — HEMOGLOBIN A1C
Est. average glucose Bld gHb Est-mCnc: 131 mg/dL
Hgb A1c MFr Bld: 6.2 % — ABNORMAL HIGH (ref 4.8–5.6)

## 2020-11-18 LAB — SPECIMEN STATUS REPORT

## 2020-11-18 NOTE — Progress Notes (Signed)
A1c is great at 6.2; no need to add or change medicines.

## 2020-11-21 ENCOUNTER — Other Ambulatory Visit: Payer: Self-pay | Admitting: Interventional Cardiology

## 2020-11-25 ENCOUNTER — Telehealth: Payer: Self-pay | Admitting: Interventional Cardiology

## 2020-11-25 NOTE — Telephone Encounter (Signed)
Left a message for pt to call back

## 2020-11-25 NOTE — Telephone Encounter (Signed)
Pt c/o medication issue:  1. Name of Medication: metoprolol succinate (TOPROL-XL) 25 MG 24 hr tablet  2. How are you currently taking this medication (dosage and times per day)? Not currently taking this medicine  3. Are you having a reaction (difficulty breathing--STAT)? no  4. What is your medication issue?  Pt was taken off of this med a few months ago, he received a call from the pharmacy advising him that this script is ready for pickup. Pt states he does not want to go back on this medicine because he have no more back pain and he have lost over 20 lbs and he feels much better since he does not have to take this medicine again. [place] and he  wants to know if this medicine can be removed from  his meds list.

## 2020-11-29 NOTE — Telephone Encounter (Signed)
Called pt and left detailed message letting him know that if he has not been taking this medication and someone told him to stop it, then do not pick it up from the pharmacy.

## 2021-02-01 ENCOUNTER — Ambulatory Visit
Admission: RE | Admit: 2021-02-01 | Discharge: 2021-02-01 | Disposition: A | Discharge status: Home / Self Care | Payer: Medicare Other | Source: Ambulatory Visit | Attending: Physical Medicine and Rehabilitation | Admitting: Physical Medicine and Rehabilitation

## 2021-02-01 ENCOUNTER — Encounter: Payer: Self-pay | Admitting: Physical Medicine and Rehabilitation

## 2021-02-01 ENCOUNTER — Encounter
Payer: Medicare Other | Attending: Physical Medicine and Rehabilitation | Admitting: Physical Medicine and Rehabilitation

## 2021-02-01 ENCOUNTER — Other Ambulatory Visit: Payer: Self-pay

## 2021-02-01 VITALS — BP 136/84 | HR 69 | Temp 97.9°F | Ht 67.0 in | Wt 183.0 lb

## 2021-02-01 DIAGNOSIS — G8929 Other chronic pain: Secondary | ICD-10-CM

## 2021-02-01 DIAGNOSIS — M5442 Lumbago with sciatica, left side: Secondary | ICD-10-CM

## 2021-02-01 DIAGNOSIS — M5441 Lumbago with sciatica, right side: Secondary | ICD-10-CM | POA: Diagnosis not present

## 2021-02-01 DIAGNOSIS — M79671 Pain in right foot: Secondary | ICD-10-CM

## 2021-02-01 DIAGNOSIS — M19049 Primary osteoarthritis, unspecified hand: Secondary | ICD-10-CM | POA: Diagnosis not present

## 2021-02-01 DIAGNOSIS — M545 Low back pain, unspecified: Secondary | ICD-10-CM | POA: Diagnosis not present

## 2021-02-01 MED ORDER — DULOXETINE HCL 30 MG PO CPEP: 30.0000 mg | ORAL_CAPSULE | Freq: Every day | ORAL | 1 refills | Status: DC

## 2021-02-01 NOTE — Patient Instructions (Signed)
  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 Also fell on edge of pool in 03/2016 and had chronic low back pain since then. Here for evaluation of back pain. Mild sciatica- think could be spinal stenosis.   Need to do core exercises 5 DAYS/week-don't do more- just do more frequent. Focus on Obliques/and back muscles- leg lifts. Based on research. Don't overdo it!   2. Lumbar xray and if need be, can do CT MRI examination of the internal auditory canals of lumbar spine.  [facility]- can just walk- in- doesn't need an appointment- and can order imaging from there.   3. Voltaren gel 2 Grams 4x/day- over the counter.  For hands and R foot/toe pain. Topical anti-inflammatory- can use on blood thinner.    4. Saw palmetto for urinary frequency at night- is the similar ingredient to Flomax- which helps with enlarged prostate  5.  Cymbalta/Duloxetine 30 mg daily  x 1 week, then 60 mg daily-  can take at night if it makes you sleepy- if it makes you nauseated, call me and I can give anti-nausea. - 90 days supply-   6. F/U in 2 months- f/u on chronic back pain.

## 2021-02-01 NOTE — Progress Notes (Signed)
Subjective:    Patient ID: Scott Roth, male    DOB: May 22, 1955, 66 y.o.   MRN: [id number]  HPI  Pt is a 65 yr old male with hx of prediabetes with A1c of 6.2, CAD s/p CABG x4 2006/PCI 2008; CMO- EF 35%, sCHF, GERD, HLD, and HTN. Also fell on edge of pool in 03/2016 and had chronic low back pain since then. Here for evaluation of back pain.   Prior to that fall, never had back pain.  Never had imaging of back.    Stays active- martial artist since 65 yrs old. Stretches a lot- does 150 sit ups and push ups- 3 days/week.   Cardio- walks but mainly building house.  Building a house- up and down ladder, and doing all the tasks-   Back pain- depends on the activity- hurts when stands for too long.  Worse when bends forwards and touches toes.  Cannot straighten up right away when stands up from sitting/laying down.  Sometimes hurts so bad, cannot walk well-  Just so painful, can get bad enough, hard to walk Wants to go back to the things he used to do.   Becoming more frequent- not constant. Does better when working out  Also hands hurts and R big toe.  Likely arthritis- was told had arthritis.   Tried: Cannot take Ibuprofen- Due to being on blood thinner Takes tylenol- couple times per month- tylenol helps a bit- takes the edge off- takes at night- and then doesn't hurt as much in AM.  No PT for back and no meds prescribed for back pain.    Also hands fall asleep- at night- Also pees a lot at night- no B/B issues otherwise, no weakness or numbness other than hands.    Also does inversion table- releases the pressure- makes it feel a lot better-  Downsized to smaller home- but  no room for inversion table.   Also has some sciatica- down to mid calf occ-.   PMHx: R shoulder surgery-  B/L knee replacement CABG   Pain Inventory Average Pain 2 Pain Right Now 3 My pain is intermittent, sharp, stabbing, and tingling  In the last 24 hours, has pain interfered with  the following? General activity 2 Relation with others 0 Enjoyment of life 1 What TIME of day is your pain at its worst? morning  and night Sleep (in general) Fair  Pain is worse with: standing and some activites Pain improves with: heat/ice, therapy/exercise, and medication Relief from Meds: 7  walk without assistance ability to climb steps?  yes do you drive?  yes  retired  bladder control problems numbness tingling dizziness  Any changes since last visit?  no, New Patient  Any changes since last visit?  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   Social Determinants of Health   Financial Resource Strain: Not on file  Food Insecurity: Not on file  Transportation Needs: Not on file  Physical Activity: Not on file  Stress: Not on file  Social Connections: Not on file   Past Surgical History:  Procedure Laterality Date   CARDIAC CATHETERIZATION     X 1 stent in  2008 due to heart valve collapse after CABG   COLONOSCOPY N/A 11/10/2020   Procedure: COLONOSCOPY;  Surgeon: Rogene Houston, MD;  [facility];  Service: Endoscopy;  Laterality: N/A;  10:00   CORONARY ARTERY BYPASS GRAFT  2008   x4 vessels   EYE SURGERY Bilateral    Lasik   KNEE ARTHROPLASTY Left    LEFT HEART CATH AND CORS/GRAFTS ANGIOGRAPHY N/A 07/25/2017   Procedure: LEFT HEART CATH AND CORS/GRAFTS ANGIOGRAPHY;  Surgeon: Lorretta Harp, MD;  Location: [facility];  Service: Cardiovascular;  Laterality: N/A;   POLYPECTOMY  11/10/2020   Procedure: POLYPECTOMY;  Surgeon: Rogene Houston, MD;  [facility];  Service: Endoscopy;;   SHOULDER SURGERY Right    TOTAL KNEE ARTHROPLASTY Right 08/25/2014   Procedure: RIGHT TOTAL KNEE ARTHROPLASTY;  Surgeon: Kathryne Hitch, MD;  Location: [facility];  Service: Orthopedics;  Laterality: Right;   Past Medical History:  Diagnosis Date   Allergy    seasonal allergies   Arthritis    bilateral hands, knees, shoulders   CAD (coronary artery disease)    a. CABG x4 with LIMA to dLAD, free RIMA to OM1, SVG to D1, SVG to OM2 in 2006. b. PCI 2008 with notable ventricular fibrillation during cath) - DES to prox LAD/1st diagonal. c. Cath 07/25/17 demonstrated previously placed stents, patent LIMA-LAD, patent free RIMA-OM, occluded VG-diagonal and occluded Y graft-OM2, without significant change from prior, elevated LVEDP.   Cardiomyopathy ([place])    EF prev normal 2004, 2008 // EF 35% by cath in 07/2017 // Echo 8/19:  Mild conc LVH, EF 40-45, diff HK, Gr 1 DD, mild AI, mild MR, mod LAE, normal RVSF, mild RAE, mild TR   Chest pain with moderate risk of acute coronary syndrome 22/05/5425   Chronic systolic CHF (congestive heart failure) (HCC)    Erectile dysfunction    GERD (gastroesophageal reflux disease)    on meds   Hypercholesteremia    Hyperlipidemia    on meds   Hypertension    on meds   Sleep apnea    does not wear CPAP mask    Tubular adenoma    BP (!) 152/92   Pulse 69   Temp 97.9 F (36.6 C)   Ht 5\' 7"  (1.702 m)   Wt 183 lb (83 kg)   SpO2 98%   BMI 28.66 kg/m   Opioid Risk Score:   Fall Risk Score:  `1  Depression screen [facility] 2/9  Depression screen [facility] 2/9 02/01/2021 11/14/2020 07/18/2020 06/06/2020 05/04/2020 07/16/2019 07/16/2019  Decreased  Interest 0 0 0 0 0 0 0  Down, Depressed, Hopeless 0 0 0 0 0 0 0  PHQ - 2 Score 0 0 0 0 0 0 0  Altered sleeping 0 - - - - - -  Tired, decreased energy 1 - - - - - -  Change in appetite 0 - - - - - -  Feeling bad or failure about yourself  0 - - - - - -  Trouble concentrating 0 - - - - - -  Moving slowly or fidgety/restless 0 - - - - - -  Suicidal thoughts 0 - - - - - -  PHQ-9 Score 1 - - - - - -    Review of Systems  Musculoskeletal:  Positive for back pain.  All other systems reviewed and are negative.     Objective:   Physical Exam Awake, alert, appropriate, talkative in great shape, NAD  MS: 5/5 in LE's- HF, KE, KF DF and PF B/L  Rotation makes L low back pain worse and extension makes it even worse.  Flexion of lumbar spine causes no increase pain.  Has palpable secondary myofascial trigger points in paraspinals.   Neuro: Intact to light touch in LE's B/L  DTRs 2+ in B/L LE's- patella and achilles      Assessment & Plan:    Pt is a 65 yr old male with hx of prediabetes with A1c of 6.2, CAD s/p CABG x4 2006/PCI 2008; CMO- EF 35%, sCHF, GERD, HLD, and HTN. Also fell on edge of pool in 03/2016 and had chronic low back pain since then. Here for evaluation of back pain. Mild sciatica- think could be spinal stenosis.   Need to do core exercises 5 DAYS/week-don't do more- just do more frequent. Focus on Obliques/and back muscles- leg lifts. Based on research. Don't overdo it!   2. Lumbar xray and if need be, can do CT MRI examination of the internal auditory canals of lumbar spine.  [facility]- can just walk- in- doesn't  need an appointment- and can order imaging from there.   3. Voltaren gel 2 Grams 4x/day- over the counter.  For hands and R foot/toe pain. Topical anti-inflammatory- can use on blood thinner.    4. Saw palmetto for urinary frequency at night- is the similar ingredient to Flomax- which helps with enlarged prostate  5.  Cymbalta/Duloxetine 30 mg daily  x 1 week, then 60 mg daily-  can take at night if it makes you sleepy- if it makes you nauseated, call me and I can give anti-nausea. - 90 days supply-   6. F/U in 2 months- f/u on chronic back pain.   I spent a total of 48 minutes on total visit- discussing BPH and back pain and arthritis.

## 2021-03-07 ENCOUNTER — Ambulatory Visit: Payer: Medicare Other | Admitting: Physician Assistant

## 2021-03-16 ENCOUNTER — Ambulatory Visit: Payer: Medicare Other | Admitting: Nurse Practitioner

## 2021-04-03 ENCOUNTER — Encounter: Payer: Medicare Other | Admitting: Physical Medicine and Rehabilitation

## 2021-04-03 ENCOUNTER — Encounter
Payer: Medicare Other | Attending: Physical Medicine and Rehabilitation | Admitting: Physical Medicine and Rehabilitation

## 2021-04-03 DIAGNOSIS — G8929 Other chronic pain: Secondary | ICD-10-CM | POA: Insufficient documentation

## 2021-04-03 DIAGNOSIS — M19049 Primary osteoarthritis, unspecified hand: Secondary | ICD-10-CM | POA: Insufficient documentation

## 2021-04-03 DIAGNOSIS — M79671 Pain in right foot: Secondary | ICD-10-CM | POA: Insufficient documentation

## 2021-04-03 DIAGNOSIS — M5442 Lumbago with sciatica, left side: Secondary | ICD-10-CM | POA: Insufficient documentation

## 2021-04-03 DIAGNOSIS — M5441 Lumbago with sciatica, right side: Secondary | ICD-10-CM | POA: Insufficient documentation

## 2021-04-04 ENCOUNTER — Other Ambulatory Visit: Payer: Self-pay

## 2021-04-04 ENCOUNTER — Encounter: Payer: Self-pay | Admitting: Nurse Practitioner

## 2021-04-04 ENCOUNTER — Ambulatory Visit (INDEPENDENT_AMBULATORY_CARE_PROVIDER_SITE_OTHER): Payer: Medicare Other | Admitting: Nurse Practitioner

## 2021-04-04 VITALS — BP 134/87 | HR 62 | Ht 67.0 in | Wt 187.1 lb

## 2021-04-04 DIAGNOSIS — Z0000 Encounter for general adult medical examination without abnormal findings: Secondary | ICD-10-CM | POA: Diagnosis not present

## 2021-04-04 DIAGNOSIS — Z23 Encounter for immunization: Secondary | ICD-10-CM

## 2021-04-04 NOTE — Addendum Note (Signed)
Addended by: Quentin Angst on: 04/04/2021 11:39 AM   Modules accepted: Orders

## 2021-04-04 NOTE — Assessment & Plan Note (Signed)
-  Katie asked all AWV questions -EKG showed NSR -Prevnar-20 administered today -due for Pneumovax-23 in 1 year

## 2021-04-04 NOTE — Progress Notes (Signed)
Acute Office Visit  Subjective:    Patient ID: Scott Roth, male    DOB: 1955/05/23, 65 y.o.   MRN: [id number]  Chief Complaint  Patient presents with   Medicare Wellness    Welcome to medicare     HPI Patient is in today for Welcome to Medicare visit.  No acute concerns.  Past Medical History:  Diagnosis Date   Allergy    seasonal allergies   Arthritis    bilateral hands, knees, shoulders   CAD (coronary artery disease)    a. CABG x4 with LIMA to dLAD, free RIMA to OM1, SVG to D1, SVG to OM2 in 2006. b. PCI 2008 with notable ventricular fibrillation during cath) - DES to prox LAD/1st diagonal. c. Cath 07/25/17 demonstrated previously placed stents, patent LIMA-LAD, patent free RIMA-OM, occluded VG-diagonal and occluded Y graft-OM2, without significant change from prior, elevated LVEDP.   Cardiomyopathy ([place])    EF prev normal 2004, 2008 // EF 35% by cath in 07/2017 // Echo 8/19:  Mild conc LVH, EF 40-45, diff HK, Gr 1 DD, mild AI, mild MR, mod LAE, normal RVSF, mild RAE, mild TR   Chest pain with moderate risk of acute coronary syndrome 62/26/3335   Chronic systolic CHF (congestive heart failure) (HCC)    Erectile dysfunction    GERD (gastroesophageal reflux disease)    on meds   Hypercholesteremia    Hyperlipidemia    on meds   Hypertension    on meds   Sleep apnea    does not wear CPAP mask   Tubular adenoma     Past Surgical History:  Procedure Laterality Date   CARDIAC CATHETERIZATION     X 1 stent in 2008 due to heart valve collapse after CABG   COLONOSCOPY N/A 11/10/2020   Procedure: COLONOSCOPY;  Surgeon: Rogene Houston, MD;  [facility];  Service: Endoscopy;  Laterality: N/A;  10:00   CORONARY ARTERY BYPASS GRAFT  2008   x4 vessels   EYE SURGERY Bilateral    Lasik   KNEE ARTHROPLASTY Left    LEFT HEART CATH AND CORS/GRAFTS ANGIOGRAPHY N/A 07/25/2017   Procedure: LEFT HEART CATH AND CORS/GRAFTS ANGIOGRAPHY;  Surgeon: Lorretta Harp,  MD;  Location: [facility];  Service: Cardiovascular;  Laterality: N/A;   POLYPECTOMY  11/10/2020   Procedure: POLYPECTOMY;  Surgeon: Rogene Houston, MD;  [facility];  Service: Endoscopy;;   SHOULDER SURGERY Right    TOTAL KNEE ARTHROPLASTY Right 08/25/2014   Procedure: RIGHT TOTAL KNEE ARTHROPLASTY;  Surgeon: Kathryne Hitch, MD;  Location: [facility];  Service: Orthopedics;  Laterality: Right;    Family History  Problem Relation Age of Onset   Heart failure Father    Heart disease Father    Hypertension Mother    Colon polyps Neg Hx    Colon cancer Neg Hx    Esophageal cancer Neg Hx    Rectal cancer Neg Hx    Stomach cancer Neg Hx     Social History   Socioeconomic History   Marital status: Married    Spouse name: Not on file   Number of children: Not on file   Years of education: Not on file   Highest education level: Not on file  Occupational History   Occupation: Farming    Comment: owned multiple businesses; owns a bar, Barrister's clerk  Tobacco Use   Smoking status: Never   Smokeless tobacco: Never  Media planner  Vaping Use: Never used  Substance and Sexual Activity   Alcohol use: Not Currently    Comment: 1-2 times per month   Drug use: No   Sexual activity: Yes    Partners: Female  Other Topics Concern   Not on file  Social History Narrative   Married for 2 years.Lives with wife and step daughter.Originally born [place].Retired,business entrepeuer-owns several companies.   Social Determinants of Health   Financial Resource Strain: Low Risk    Difficulty of Paying Living Expenses: Not hard at all  Food Insecurity: No Food Insecurity   Worried About Charity fundraiser in the Last Year: Never true   [place] in the Last Year: Never true  Transportation Needs: No Transportation Needs   Lack of Transportation (Medical): No   Lack of Transportation (Non-Medical): No  Physical Activity: Sufficiently Active   Days of  Exercise per Week: 4 days   Minutes of Exercise per Session: 60 min  Stress: No Stress Concern Present   Feeling of Stress : Not at all  Social Connections: Moderately Isolated   Frequency of Communication with Friends and Family: More than three times a week   Frequency of Social Gatherings with Friends and Family: Twice a week   Attends Religious Services: Never   Marine scientist or Organizations: No   Attends Music therapist: Never   Marital Status: Married  Human resources officer Violence: Not At Risk   Fear of Current or Ex-Partner: No   Emotionally Abused: No   Physically Abused: No   Sexually Abused: No    Outpatient Medications Prior to Visit  Medication Sig Dispense Refill   aspirin EC 81 MG tablet Take 1 tablet (81 mg total) by mouth daily. Swallow whole.     B Complex-C (SUPER B COMPLEX PO) Take 1 capsule by mouth daily.     Cholecalciferol (VITAMIN D3) 125 MCG (5000 UT) CAPS Take 10,000 Units by mouth daily.      Coenzyme Q10 (COQ10) 100 MG CAPS Take 100 mg by mouth daily.     Cyanocobalamin (B-12 PO) Take 1 tablet by mouth daily.      DULoxetine (CYMBALTA) 30 MG capsule Take 1 capsule (30 mg total) by mouth daily. Take 30 mg daily x 1 week, then 60 mg daily- for nerve and back pain. 180 capsule 1   ENTRESTO 97-103 MG TAKE 1 TABLET BY MOUTH TWICE DAILY 599 tablet 3   GARLIC PO Take 1 tablet by mouth daily.     Ginkgo Biloba 40 MG TABS Take 40 mg by mouth daily.     meclizine (ANTIVERT) 25 MG tablet Take 1 tablet (25 mg total) by mouth 3 (three) times daily as needed for dizziness. 30 tablet 0   Multiple Vitamins-Minerals (MULTIVITAMIN PO) Take 1 tablet by mouth daily.      nitroGLYCERIN (NITROSTAT) 0.4 MG SL tablet PLACE 1 TABLET UNDER THE TONGUE EVERY 5 (FIVE) MINUTES X 3 DOSES AS NEEDED FOR CHEST PAIN. 25 tablet 3   pantoprazole (PROTONIX) 40 MG tablet Take 1 tablet (40 mg total) by mouth daily as needed. 90 tablet 1   rosuvastatin (CRESTOR) 40 MG tablet  Take 40 mg by mouth daily.     TURMERIC PO Take 500 mg by mouth daily.      vitamin C (ASCORBIC ACID) 500 MG tablet Take 500 mg by mouth daily.     VITAMIN E PO Take 400 mg by mouth daily.  Facility-Administered Medications Prior to Visit  Medication Dose Route Frequency Provider Last Rate Last Admin   betamethasone acetate-betamethasone sodium phosphate (CELESTONE) injection 9 mg  9 mg Other Once Evelina Bucy, DPM        Allergies  Allergen Reactions   Penicillins Itching and Rash    Other reaction(s): Other (See Comments) Has patient had a PCN reaction causing immediate rash, facial/tongue/throat swelling, SOB or lightheadedness with hypotension: No Has patient had a PCN reaction causing severe rash involving mucus membranes or skin necrosis: No Has patient had a PCN reaction that required hospitalization: No Has patient had a PCN reaction occurring within the last 10 years: No If all of the above answers are "NO", then may proceed with Cephalosporin use.    Review of Systems  Constitutional: Negative.   Respiratory: Negative.    Cardiovascular: Negative.   Musculoskeletal: Negative.   Psychiatric/Behavioral: Negative.        Objective:    Physical Exam Constitutional:      Appearance: Normal appearance.  Cardiovascular:     Rate and Rhythm: Normal rate and regular rhythm.     Pulses: Normal pulses.     Heart sounds: Normal heart sounds.  Pulmonary:     Effort: Pulmonary effort is normal.     Breath sounds: Normal breath sounds.  Musculoskeletal:        General: Normal range of motion.  Neurological:     Mental Status: He is alert.  Psychiatric:        Mood and Affect: Mood normal.        Behavior: Behavior normal.        Thought Content: Thought content normal.        Judgment: Judgment normal.    BP 134/87    Pulse 62    Ht '5\' 7"'  (1.702 m)    Wt 187 lb 1.3 oz (84.9 kg)    SpO2 95%    BMI 29.30 kg/m  Wt Readings from Last 3 Encounters:  04/04/21 187  lb 1.3 oz (84.9 kg)  02/01/21 183 lb (83 kg)  11/14/20 190 lb (86.2 kg)    Health Maintenance Due  Topic Date Due   Pneumonia Vaccine 4+ Years old (1 - PCV) Never done   Zoster Vaccines- Shingrix (1 of 2) Never done   COVID-19 Vaccine (5 - Booster) 10/01/2019    There are no preventive care reminders to display for this patient.   Lab Results  Component Value Date   TSH 1.02 06/30/2019   Lab Results  Component Value Date   WBC 6.9 11/15/2020   HGB 15.8 11/15/2020   HCT 47.0 11/15/2020   MCV 97 11/15/2020   PLT 274 11/15/2020   Lab Results  Component Value Date   NA 140 11/15/2020   K 4.5 11/15/2020   CO2 18 (L) 11/15/2020   GLUCOSE 108 (H) 11/15/2020   BUN 20 11/15/2020   CREATININE 0.96 11/15/2020   BILITOT 0.6 11/15/2020   ALKPHOS 66 11/15/2020   AST 19 11/15/2020   ALT 19 11/15/2020   PROT 6.9 11/15/2020   ALBUMIN 4.3 11/15/2020   CALCIUM 9.3 11/15/2020   ANIONGAP 7 09/18/2017   EGFR 88 11/15/2020   GFR 89.73 08/31/2013   Lab Results  Component Value Date   CHOL 148 11/15/2020   Lab Results  Component Value Date   HDL 45 11/15/2020   Lab Results  Component Value Date   LDLCALC 71 11/15/2020   Lab Results  Component  Value Date   TRIG 193 (H) 11/15/2020   Lab Results  Component Value Date   CHOLHDL 2.9 10/12/2019   Lab Results  Component Value Date   HGBA1C 6.2 (H) 11/15/2020       Assessment & Plan:   Problem List Items Addressed This Visit       Other   Welcome to Medicare preventive visit    -Katie asked all AWV questions -EKG showed NSR -Prevnar-20 administered today -due for Pneumovax-23 in 1 year        No orders of the defined types were placed in this encounter.    Noreene Larsson, NP

## 2021-04-04 NOTE — Progress Notes (Signed)
Cardiology Office Note:    Date:  04/05/2021   ID:  Scott Roth, DOB March 24, 1956, MRN [id number]  PCP:  Noreene Larsson, NP  Cardiologist:  Sinclair Grooms, MD   Referring MD: Noreene Larsson, NP   Chief Complaint  Patient presents with   Coronary Artery Disease   Hypertension   Hyperlipidemia    History of Present Illness:    Scott Roth is a 65 y.o. male with a hx of   CAD, s/p CABG in 2006, subsequent PCI with DES to the LAD/D1 in 6144, systolic CHF, hypertension, hyperlipidemia, OSA not on CPAP.  Nuclear stress test 4/19 depressed LVF and cardiac cath demonstrated  patent L-LAD, patent RIMA-OM1, occluded S-Dx and an occluded S-OM2, and now stable on medical therapy.    Seen by his primary physician yesterday.  The patient auto discontinued beta-blocker therapy.  We had a long conversation concerning quadruple therapy for systolic heart failure and the benefits of preventing progression of systolic dysfunction and improving outcomes.  At a minimum he needs to be back on beta-blocker therapy.  I would also like for an SGLT2 to be added.  He is somewhat reluctant as he prefers a naturopathic approach that includes appropriate weight control and diet.  He denies angina.  He has not been successful in treating sleep apnea with his device.  Past Medical History:  Diagnosis Date   Allergy    seasonal allergies   Arthritis    bilateral hands, knees, shoulders   CAD (coronary artery disease)    a. CABG x4 with LIMA to dLAD, free RIMA to OM1, SVG to D1, SVG to OM2 in 2006. b. PCI 2008 with notable ventricular fibrillation during cath) - DES to prox LAD/1st diagonal. c. Cath 07/25/17 demonstrated previously placed stents, patent LIMA-LAD, patent free RIMA-OM, occluded VG-diagonal and occluded Y graft-OM2, without significant change from prior, elevated LVEDP.   Cardiomyopathy ([place])    EF prev normal 2004, 2008 // EF 35% by cath in 07/2017 // Echo 8/19:  Mild conc LVH, EF  40-45, diff HK, Gr 1 DD, mild AI, mild MR, mod LAE, normal RVSF, mild RAE, mild TR   Chest pain with moderate risk of acute coronary syndrome 31/54/0086   Chronic systolic CHF (congestive heart failure) (HCC)    Erectile dysfunction    GERD (gastroesophageal reflux disease)    on meds   Hypercholesteremia    Hyperlipidemia    on meds   Hypertension    on meds   Sleep apnea    does not wear CPAP mask   Tubular adenoma     Past Surgical History:  Procedure Laterality Date   CARDIAC CATHETERIZATION     X 1 stent in 2008 due to heart valve collapse after CABG   COLONOSCOPY N/A 11/10/2020   Procedure: COLONOSCOPY;  Surgeon: Rogene Houston, MD;  [facility];  Service: Endoscopy;  Laterality: N/A;  10:00   CORONARY ARTERY BYPASS GRAFT  2008   x4 vessels   EYE SURGERY Bilateral    Lasik   KNEE ARTHROPLASTY Left    LEFT HEART CATH AND CORS/GRAFTS ANGIOGRAPHY N/A 07/25/2017   Procedure: LEFT HEART CATH AND CORS/GRAFTS ANGIOGRAPHY;  Surgeon: Lorretta Harp, MD;  Location: [facility];  Service: Cardiovascular;  Laterality: N/A;   POLYPECTOMY  11/10/2020   Procedure: POLYPECTOMY;  Surgeon: Rogene Houston, MD;  [facility];  Service: Endoscopy;;   SHOULDER SURGERY Right  TOTAL KNEE ARTHROPLASTY Right 08/25/2014   Procedure: RIGHT TOTAL KNEE ARTHROPLASTY;  Surgeon: Kathryne Hitch, MD;  Location: [facility];  Service: Orthopedics;  Laterality: Right;    Current Medications: Current Meds  Medication Sig   aspirin EC 81 MG tablet Take 1 tablet (81 mg total) by mouth daily. Swallow whole.   B Complex-C (SUPER B COMPLEX PO) Take 1 capsule by mouth daily.   Cholecalciferol (VITAMIN D3) 125 MCG (5000 UT) CAPS Take 10,000 Units by mouth daily.    Coenzyme Q10 (COQ10) 100 MG CAPS Take 100 mg by mouth daily.   Cyanocobalamin (B-12 PO) Take 1 tablet by mouth daily.    GARLIC PO Take 1 tablet by mouth daily.   Ginkgo Biloba 40 MG TABS Take 40 mg by mouth daily.    meclizine (ANTIVERT) 25 MG tablet Take 1 tablet (25 mg total) by mouth 3 (three) times daily as needed for dizziness.   Multiple Vitamins-Minerals (MULTIVITAMIN PO) Take 1 tablet by mouth daily.    nitroGLYCERIN (NITROSTAT) 0.4 MG SL tablet PLACE 1 TABLET UNDER THE TONGUE EVERY 5 (FIVE) MINUTES X 3 DOSES AS NEEDED FOR CHEST PAIN.   pantoprazole (PROTONIX) 40 MG tablet Take 1 tablet (40 mg total) by mouth daily as needed.   TURMERIC PO Take 500 mg by mouth daily.    vitamin C (ASCORBIC ACID) 500 MG tablet Take 500 mg by mouth daily.   VITAMIN E PO Take 400 mg by mouth daily.    [DISCONTINUED] ENTRESTO 97-103 MG TAKE 1 TABLET BY MOUTH TWICE DAILY   [DISCONTINUED] rosuvastatin (CRESTOR) 40 MG tablet Take 40 mg by mouth daily.   Current Facility-Administered Medications for the 04/05/21 encounter (Office Visit) with Belva Crome, MD  Medication   betamethasone acetate-betamethasone sodium phosphate (CELESTONE) injection 9 mg     Allergies:   Penicillins   Social History   Socioeconomic History   Marital status: Married    Spouse name: Not on file   Number of children: Not on file   Years of education: Not on file   Highest education level: Not on file  Occupational History   Occupation: Farming    Comment: owned multiple businesses; owns a bar, Barrister's clerk  Tobacco Use   Smoking status: Never   Smokeless tobacco: Never  Scientific laboratory technician Use: Never used  Substance and Sexual Activity   Alcohol use: Not Currently    Comment: 1-2 times per month   Drug use: No   Sexual activity: Yes    Partners: Female  Other Topics Concern   Not on file  Social History Narrative   Married for 2 years.Lives with wife and step daughter.Originally born [place].Retired,business entrepeuer-owns several companies.   Social Determinants of Health   Financial Resource Strain: Low Risk    Difficulty of Paying Living Expenses: Not hard at all  Food Insecurity: No Food  Insecurity   Worried About Charity fundraiser in the Last Year: Never true   [place] in the Last Year: Never true  Transportation Needs: No Transportation Needs   Lack of Transportation (Medical): No   Lack of Transportation (Non-Medical): No  Physical Activity: Sufficiently Active   Days of Exercise per Week: 4 days   Minutes of Exercise per Session: 60 min  Stress: No Stress Concern Present   Feeling of Stress : Not at all  Social Connections: Moderately Isolated   Frequency of Communication with Friends and Family: More than  three times a week   Frequency of Social Gatherings with Friends and Family: Twice a week   Attends Religious Services: Never   Marine scientist or Organizations: No   Attends Music therapist: Never   Marital Status: Married     Family History: The patient's family history includes Heart disease in his father; Heart failure in his father; Hypertension in his mother. There is no history of Colon polyps, Colon cancer, Esophageal cancer, Rectal cancer, or Stomach cancer.  ROS:   Please see the history of present illness.    He is doing a lot of international travel.  He will be going to [place] to sell a hotel that he says was previously worth $800,000 and will be selling it now for $195,000.  He is recently back from an extended holiday in [place].  All other systems reviewed and are negative.  EKGs/Labs/Other Studies Reviewed:    The following studies were reviewed today:  2 D Doppler Echocardiogram 06/03/2020: IMPRESSIONS    1. Left ventricular ejection fraction, by estimation, is 45 to 50%. Left  ventricular ejection fraction by 3D volume is 50 %. The left ventricle has  mildly decreased function. The left ventricle demonstrates global  hypokinesis. Left ventricular diastolic   parameters are consistent with Grade III diastolic dysfunction  (restrictive).   2. Right ventricular systolic function is low normal. The right   ventricular size is normal. There is normal pulmonary artery systolic  pressure. The estimated right ventricular systolic pressure is 17.5 mmHg.   3. The mitral valve is normal in structure. Mild mitral valve  regurgitation.   4. The aortic valve is tricuspid. Aortic valve regurgitation is mild to  moderate. No aortic stenosis is present.   5. Aortic dilatation noted. There is borderline dilatation of the aortic  root, measuring 39 mm. There is borderline dilatation of the ascending  aorta, measuring 37 mm.   Comparison(s): A prior study was performed on 12/06/2017. COmpared to  prior, slight increase in ventricular function, aortic regurgitation has  slightly increased with similar aortic size.   EKG:  [facility] at [facility] demonstrates normal sinus rhythm, normal axis, nonspecific T wave flattening, tall R wave in V1.  Recent Labs: 11/15/2020: ALT 19; BUN 20; Creatinine, Ser 0.96; Hemoglobin 15.8; Platelets 274; Potassium 4.5; Sodium 140  Recent Lipid Panel    Component Value Date/Time   CHOL 148 11/15/2020 0927   TRIG 193 (H) 11/15/2020 0927   HDL 45 11/15/2020 0927   CHOLHDL 2.9 10/12/2019 0805   CHOLHDL 4.9 06/30/2019 1345   VLDL 21 07/24/2017 1924   LDLCALC 71 11/15/2020 0927   LDLCALC 110 (H) 06/30/2019 1345    Physical Exam:    VS:  BP 96/62    Pulse 82    Ht 5\' 7"  (1.702 m)    Wt 191 lb 6.4 oz (86.8 kg)    SpO2 95%    BMI 29.98 kg/m     Wt Readings from Last 3 Encounters:  04/05/21 191 lb 6.4 oz (86.8 kg)  04/04/21 187 lb 1.3 oz (84.9 kg)  02/01/21 183 lb (83 kg)     GEN: Overweight. No acute distress HEENT: Normal NECK: No JVD. LYMPHATICS: No lymphadenopathy CARDIAC: No murmur. RRR S4 gallop is present but no edema. VASCULAR:  Normal Pulses. No bruits. RESPIRATORY:  Clear to auscultation without rales, wheezing or rhonchi  ABDOMEN: Soft, non-tender, non-distended, No pulsatile mass, MUSCULOSKELETAL: No deformity  SKIN: Warm and  dry NEUROLOGIC:  Alert and oriented x 3 PSYCHIATRIC:  Normal affect   ASSESSMENT:    1. Coronary artery disease involving coronary bypass graft of native heart without angina pectoris   2. Chronic systolic CHF (congestive heart failure) ([place])   3. Essential hypertension, benign   4. Type 2 diabetes mellitus with other circulatory complication, without long-term current use of insulin ([place])   5. Hyperlipidemia, unspecified hyperlipidemia type   6. Sleep apnea, unspecified type    PLAN:    In order of problems listed above:  Secondary prevention discussed. Asymptomatic systolic heart failure.  Not on a very good regimen.  He discontinued beta-blocker therapy.  We did discuss the benefit of long-term multidrug management of systolic heart failure to prevent hospitalizations for heart failure and improve survival.  Resume Toprol-XL 12.5 mg/day.  Consider adding SGLT2 in the future.  MRA would be the last addition if he allows [place].  Generally speaking, he prefers therapy only if it is controlling a particular symptom. Blood pressure is adequately controlled.  Repeat blood pressure today 118/70.  Check and blood pressure seem to be somewhat lower but was probably mistaken. We discussed keeping hemoglobin A1c less than 7.  Last A1c was 6.2 in August.  SGLT2 therapy would have benefit both for glycemic control and cardioprotection. Continue rosuvastatin 40 mg/day.  Target LDL less than 55. He is unable to tolerate sleep apparatus.   Medication Adjustments/Labs and Tests Ordered: Current medicines are reviewed at length with the patient today.  Concerns regarding medicines are outlined above.  No orders of the defined types were placed in this encounter.  Meds ordered this encounter  Medications   sacubitril-valsartan (ENTRESTO) 97-103 MG    Sig: Take 1 tablet by mouth 2 (two) times daily.    Dispense:  180 tablet    Refill:  3   rosuvastatin (CRESTOR) 40 MG tablet    Sig: Take 1 tablet (40  mg total) by mouth daily.    Dispense:  90 tablet    Refill:  3    Patient Instructions  Medication Instructions:  Your physician recommends that you continue on your current medications as directed. Please refer to the Current Medication list given to you today.  *If you need a refill on your cardiac medications before your next appointment, please call your pharmacy*   Lab Work: None ordered  If you have labs (blood work) drawn today and your tests are completely normal, you will receive your results only by: [place] (if you have MyChart) OR A paper copy in the mail If you have any lab test that is abnormal or we need to change your treatment, we will call you to review the results.   Testing/Procedures: None ordered   Follow-Up: At [facility], you and your health needs are our priority.  As part of our continuing mission to provide you with exceptional heart care, we have created designated Provider Care Teams.  These Care Teams include your primary Cardiologist (physician) and Advanced Practice Providers (APPs -  Physician Assistants and Nurse Practitioners) who all work together to provide you with the care you need, when you need it.  We recommend signing up for the patient portal called "MyChart".  Sign up information is provided on this After Visit Summary.  MyChart is used to connect with patients for Virtual Visits (Telemedicine).  Patients are able to view lab/test results, encounter notes, upcoming appointments, etc.  Non-urgent messages can be sent to your provider as  well.   To learn more about what you can do with MyChart, go to [email or web address].    Your next appointment:   6 month(s)  The format for your next appointment:   In Person  Provider:   Sinclair Grooms, MD     Other Instructions     Signed, Sinclair Grooms, MD  04/05/2021 10:10 AM    [place]

## 2021-04-04 NOTE — Patient Instructions (Signed)
I will be moving to [facility] located at [address], [place], [place] [address] effective Apr 16, 2021. °If you would like to establish care with [facility] please call [phone]. °

## 2021-04-05 ENCOUNTER — Ambulatory Visit (INDEPENDENT_AMBULATORY_CARE_PROVIDER_SITE_OTHER): Payer: Medicare Other | Admitting: Interventional Cardiology

## 2021-04-05 ENCOUNTER — Encounter: Payer: Self-pay | Admitting: Interventional Cardiology

## 2021-04-05 VITALS — BP 96/62 | HR 82 | Ht 67.0 in | Wt 191.4 lb

## 2021-04-05 DIAGNOSIS — I5022 Chronic systolic (congestive) heart failure: Secondary | ICD-10-CM

## 2021-04-05 DIAGNOSIS — I25810 Atherosclerosis of coronary artery bypass graft(s) without angina pectoris: Secondary | ICD-10-CM | POA: Diagnosis not present

## 2021-04-05 DIAGNOSIS — E785 Hyperlipidemia, unspecified: Secondary | ICD-10-CM

## 2021-04-05 DIAGNOSIS — E1159 Type 2 diabetes mellitus with other circulatory complications: Secondary | ICD-10-CM | POA: Diagnosis not present

## 2021-04-05 DIAGNOSIS — I10 Essential (primary) hypertension: Secondary | ICD-10-CM | POA: Diagnosis not present

## 2021-04-05 DIAGNOSIS — G4730 Sleep apnea, unspecified: Secondary | ICD-10-CM

## 2021-04-05 MED ORDER — ROSUVASTATIN CALCIUM 40 MG PO TABS: 40.0000 mg | ORAL_TABLET | Freq: Every day | ORAL | 3 refills | Status: DC

## 2021-04-05 MED ORDER — METOPROLOL SUCCINATE ER 25 MG PO TB24: 12.5000 mg | ORAL_TABLET | Freq: Every day | ORAL | 3 refills | Status: DC

## 2021-04-05 MED ORDER — ENTRESTO 97-103 MG PO TABS: 1.0000 | ORAL_TABLET | Freq: Two times a day (BID) | ORAL | 3 refills | Status: DC

## 2021-04-05 NOTE — Patient Instructions (Addendum)
Medication Instructions:  Your physician has recommended you make the following change in your medication:   START Metoprolol 25 taking 1/2 tablet daily   *If you need a refill on your cardiac medications before your next appointment, please call your pharmacy*   Lab Work: None ordered  If you have labs (blood work) drawn today and your tests are completely normal, you will receive your results only by: [place] (if you have MyChart) OR A paper copy in the mail If you have any lab test that is abnormal or we need to change your treatment, we will call you to review the results.   Testing/Procedures: None ordered   Follow-Up: At [facility], you and your health needs are our priority.  As part of our continuing mission to provide you with exceptional heart care, we have created designated Provider Care Teams.  These Care Teams include your primary Cardiologist (physician) and Advanced Practice Providers (APPs -  Physician Assistants and Nurse Practitioners) who all work together to provide you with the care you need, when you need it.  We recommend signing up for the patient portal called "MyChart".  Sign up information is provided on this After Visit Summary.  MyChart is used to connect with patients for Virtual Visits (Telemedicine).  Patients are able to view lab/test results, encounter notes, upcoming appointments, etc.  Non-urgent messages can be sent to your provider as well.   To learn more about what you can do with MyChart, go to [email or web address].    Your next appointment:   12  month(s)  The format for your next appointment:   In Person  Provider:   Sinclair Grooms, MD     Other Instructions

## 2021-06-01 ENCOUNTER — Emergency Department (HOSPITAL_BASED_OUTPATIENT_CLINIC_OR_DEPARTMENT_OTHER): Payer: Medicare Other

## 2021-06-01 ENCOUNTER — Other Ambulatory Visit: Payer: Self-pay

## 2021-06-01 ENCOUNTER — Emergency Department (HOSPITAL_BASED_OUTPATIENT_CLINIC_OR_DEPARTMENT_OTHER)
Admission: EM | Admit: 2021-06-01 | Discharge: 2021-06-01 | Disposition: A | Discharge status: Home / Self Care | Payer: Medicare Other | Attending: Emergency Medicine | Admitting: Emergency Medicine

## 2021-06-01 ENCOUNTER — Encounter (HOSPITAL_BASED_OUTPATIENT_CLINIC_OR_DEPARTMENT_OTHER): Payer: Self-pay | Admitting: *Deleted

## 2021-06-01 DIAGNOSIS — M25552 Pain in left hip: Secondary | ICD-10-CM | POA: Insufficient documentation

## 2021-06-01 DIAGNOSIS — S80911A Unspecified superficial injury of right knee, initial encounter: Secondary | ICD-10-CM | POA: Diagnosis not present

## 2021-06-01 DIAGNOSIS — M25551 Pain in right hip: Secondary | ICD-10-CM | POA: Diagnosis not present

## 2021-06-01 DIAGNOSIS — I509 Heart failure, unspecified: Secondary | ICD-10-CM | POA: Insufficient documentation

## 2021-06-01 DIAGNOSIS — S80211A Abrasion, right knee, initial encounter: Secondary | ICD-10-CM | POA: Diagnosis not present

## 2021-06-01 DIAGNOSIS — M546 Pain in thoracic spine: Secondary | ICD-10-CM | POA: Diagnosis not present

## 2021-06-01 DIAGNOSIS — R109 Unspecified abdominal pain: Secondary | ICD-10-CM | POA: Insufficient documentation

## 2021-06-01 DIAGNOSIS — R0781 Pleurodynia: Secondary | ICD-10-CM | POA: Insufficient documentation

## 2021-06-01 DIAGNOSIS — Z7982 Long term (current) use of aspirin: Secondary | ICD-10-CM | POA: Diagnosis not present

## 2021-06-01 DIAGNOSIS — M25562 Pain in left knee: Secondary | ICD-10-CM | POA: Diagnosis not present

## 2021-06-01 DIAGNOSIS — I110 Hypertensive heart disease with heart failure: Secondary | ICD-10-CM | POA: Insufficient documentation

## 2021-06-01 DIAGNOSIS — M545 Low back pain, unspecified: Secondary | ICD-10-CM | POA: Diagnosis not present

## 2021-06-01 DIAGNOSIS — M542 Cervicalgia: Secondary | ICD-10-CM | POA: Diagnosis not present

## 2021-06-01 DIAGNOSIS — S8991XA Unspecified injury of right lower leg, initial encounter: Secondary | ICD-10-CM | POA: Diagnosis present

## 2021-06-01 DIAGNOSIS — S0990XA Unspecified injury of head, initial encounter: Secondary | ICD-10-CM | POA: Diagnosis present

## 2021-06-01 DIAGNOSIS — M2578 Osteophyte, vertebrae: Secondary | ICD-10-CM | POA: Diagnosis not present

## 2021-06-01 DIAGNOSIS — Z79899 Other long term (current) drug therapy: Secondary | ICD-10-CM | POA: Diagnosis not present

## 2021-06-01 DIAGNOSIS — I2510 Atherosclerotic heart disease of native coronary artery without angina pectoris: Secondary | ICD-10-CM | POA: Diagnosis not present

## 2021-06-01 DIAGNOSIS — S50811A Abrasion of right forearm, initial encounter: Secondary | ICD-10-CM | POA: Diagnosis not present

## 2021-06-01 DIAGNOSIS — N281 Cyst of kidney, acquired: Secondary | ICD-10-CM | POA: Diagnosis not present

## 2021-06-01 DIAGNOSIS — M47816 Spondylosis without myelopathy or radiculopathy, lumbar region: Secondary | ICD-10-CM | POA: Diagnosis not present

## 2021-06-01 DIAGNOSIS — Z041 Encounter for examination and observation following transport accident: Secondary | ICD-10-CM | POA: Diagnosis not present

## 2021-06-01 DIAGNOSIS — M25561 Pain in right knee: Secondary | ICD-10-CM | POA: Diagnosis not present

## 2021-06-01 DIAGNOSIS — Y92410 Unspecified street and highway as the place of occurrence of the external cause: Secondary | ICD-10-CM | POA: Diagnosis not present

## 2021-06-01 DIAGNOSIS — M47814 Spondylosis without myelopathy or radiculopathy, thoracic region: Secondary | ICD-10-CM | POA: Diagnosis not present

## 2021-06-01 LAB — CBC WITH DIFFERENTIAL/PLATELET
Abs Immature Granulocytes: 0.04 10*3/uL (ref 0.00–0.07)
Basophils Absolute: 0 10*3/uL (ref 0.0–0.1)
Basophils Relative: 0 %
Eosinophils Absolute: 0.3 10*3/uL (ref 0.0–0.5)
Eosinophils Relative: 5 %
HCT: 45.5 % (ref 39.0–52.0)
Hemoglobin: 15.4 g/dL (ref 13.0–17.0)
Immature Granulocytes: 1 %
Lymphocytes Relative: 22 %
Lymphs Abs: 1.6 10*3/uL (ref 0.7–4.0)
MCH: 31.6 pg (ref 26.0–34.0)
MCHC: 33.8 g/dL (ref 30.0–36.0)
MCV: 93.2 fL (ref 80.0–100.0)
Monocytes Absolute: 0.6 10*3/uL (ref 0.1–1.0)
Monocytes Relative: 8 %
Neutro Abs: 4.7 10*3/uL (ref 1.7–7.7)
Neutrophils Relative %: 64 %
Platelets: 271 10*3/uL (ref 150–400)
RBC: 4.88 MIL/uL (ref 4.22–5.81)
RDW: 13.6 % (ref 11.5–15.5)
WBC: 7.2 10*3/uL (ref 4.0–10.5)
nRBC: 0 % (ref 0.0–0.2)

## 2021-06-01 LAB — BASIC METABOLIC PANEL
Anion gap: 10 (ref 5–15)
BUN: 17 mg/dL (ref 8–23)
CO2: 25 mmol/L (ref 22–32)
Calcium: 9.5 mg/dL (ref 8.9–10.3)
Chloride: 103 mmol/L (ref 98–111)
Creatinine, Ser: 0.87 mg/dL (ref 0.61–1.24)
GFR, Estimated: >60 mL/min (ref >60)
Glucose, Bld: 142 mg/dL — ABNORMAL HIGH (ref 70–99)
Potassium: 4 mmol/L (ref 3.5–5.1)
Sodium: 138 mmol/L (ref 135–145)

## 2021-06-01 MED ORDER — METHOCARBAMOL 500 MG PO TABS: 500.0000 mg | ORAL_TABLET | Freq: Two times a day (BID) | ORAL | 0 refills | Status: DC

## 2021-06-01 MED ORDER — IOHEXOL 300 MG/ML  SOLN
100.0000 mL | Freq: Once | INTRAMUSCULAR | Status: AC | PRN
  Administered 2021-06-01: 85 mL via INTRAVENOUS

## 2021-06-01 MED ORDER — DICLOFENAC SODIUM 1 % EX GEL: 2.0000 g | Freq: Four times a day (QID) | CUTANEOUS | 0 refills | Status: DC

## 2021-06-01 NOTE — ED Notes (Signed)
Pt. States that he is on blood thinners.
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
## 2021-08-01 DIAGNOSIS — M25571 Pain in right ankle and joints of right foot: Secondary | ICD-10-CM | POA: Diagnosis not present

## 2021-08-01 DIAGNOSIS — S93401A Sprain of unspecified ligament of right ankle, initial encounter: Secondary | ICD-10-CM | POA: Diagnosis not present

## 2021-08-23 ENCOUNTER — Ambulatory Visit: Payer: PRIVATE HEALTH INSURANCE | Admitting: Family Medicine

## 2021-10-23 DIAGNOSIS — H5203 Hypermetropia, bilateral: Secondary | ICD-10-CM | POA: Diagnosis not present

## 2021-10-25 ENCOUNTER — Ambulatory Visit (INDEPENDENT_AMBULATORY_CARE_PROVIDER_SITE_OTHER): Payer: Medicare Other | Admitting: Family Medicine

## 2021-10-25 ENCOUNTER — Encounter: Payer: Self-pay | Admitting: Family Medicine

## 2021-10-25 VITALS — BP 134/83 | HR 74 | Ht 66.0 in | Wt 191.4 lb

## 2021-10-25 DIAGNOSIS — Z0001 Encounter for general adult medical examination with abnormal findings: Secondary | ICD-10-CM

## 2021-10-25 DIAGNOSIS — R7301 Impaired fasting glucose: Secondary | ICD-10-CM

## 2021-10-25 DIAGNOSIS — R42 Dizziness and giddiness: Secondary | ICD-10-CM | POA: Diagnosis not present

## 2021-10-25 DIAGNOSIS — M79671 Pain in right foot: Secondary | ICD-10-CM | POA: Diagnosis not present

## 2021-10-25 DIAGNOSIS — K219 Gastro-esophageal reflux disease without esophagitis: Secondary | ICD-10-CM | POA: Diagnosis not present

## 2021-10-25 DIAGNOSIS — M19049 Primary osteoarthritis, unspecified hand: Secondary | ICD-10-CM

## 2021-10-25 DIAGNOSIS — E559 Vitamin D deficiency, unspecified: Secondary | ICD-10-CM | POA: Diagnosis not present

## 2021-10-25 DIAGNOSIS — M19041 Primary osteoarthritis, right hand: Secondary | ICD-10-CM

## 2021-10-25 DIAGNOSIS — S93401S Sprain of unspecified ligament of right ankle, sequela: Secondary | ICD-10-CM | POA: Diagnosis not present

## 2021-10-25 DIAGNOSIS — G5601 Carpal tunnel syndrome, right upper limb: Secondary | ICD-10-CM

## 2021-10-25 MED ORDER — OMEPRAZOLE 20 MG PO CPDR: 20.0000 mg | DELAYED_RELEASE_CAPSULE | Freq: Every day | ORAL | 3 refills | Status: DC

## 2021-10-25 NOTE — Patient Instructions (Addendum)
I appreciate the opportunity to provide care to you today!    Follow up:  3 months  Labs: please stop by the lab during the week to get your blood drawn (CBC, CMP, TSH, Lipid profile, HgA1c, Vit D)  Please pick up your medication at the pharmacy   -Please take GasX otc for relief of excess gas in your stomach   Avoid certain foods and drinks, such as coffee, chocolate, onions, peppermint, spicy foods, carbonated beverages, citrus fruits, tomatoes, onions, Garlic, alcohol, Fatty foods (bacon, burgers, sausages, steak, fried foods, dairy food)    Recommended: High fiber foods, whole grain cereal, oatmeal, brown rice, root vegetables, non- citrus fruits, High protein foods, Health fats (avocados, olive oil, nuts and seeds)   Referrals today-  orthopedics surgery   Please continue to a heart-healthy diet and increase your physical activities. Try to exercise for 81mns at least three times a week.      It was a pleasure to see you and I look forward to continuing to work together on your health and well-being. Please do not hesitate to call the office if you need care or have questions about your care.   Have a wonderful day and week. With Gratitude, GAlvira MondayMSN, FNP-BC

## 2021-10-25 NOTE — Progress Notes (Unsigned)
Established Patient Office Visit  Subjective:  Patient ID: Scott Roth, male    DOB: 11-10-1955  Age: 66 y.o. MRN: [id number]  CC:  Chief Complaint  Patient presents with   New Patient (Initial Visit)    Pt establishing care, pt [place] of the country a lot states he caught salmonella still been having symptoms lingering onset 1-2 months ago. C/o dizziness on some days. C/o right foot pain c/o swelling when he walks a lot.     HPI Scott Roth is a 66 y.o. male with past medical history of HTN presents to establish care.  Diarrhea: has resolved. Reports being treated with ciprofloxacin for 5 days. C/o of abdominal gurgling and gas. Reports occasional acid reflux and dizziness.  Right ankle sprain: onset of injury was in April 2023--reports seeing orthopedics in [place] with imaging studies done. Conservative measures were advised. Since the ankle sprain, pt has noticed pain and swelling of the injured ankle with prolonged ambulation.   Carpal tunnel: hx of hand arthritis.c/o of pain and swelling of the right wrist at the radial zone. He wears a hand brace for support and symptom relief.    Past Medical History:  Diagnosis Date   Allergy    seasonal allergies   Arthritis    bilateral hands, knees, shoulders   CAD (coronary artery disease)    a. CABG x4 with LIMA to dLAD, free RIMA to OM1, SVG to D1, SVG to OM2 in 2006. b. PCI 2008 with notable ventricular fibrillation during cath) - DES to prox LAD/1st diagonal. c. Cath 07/25/17 demonstrated previously placed stents, patent LIMA-LAD, patent free RIMA-OM, occluded VG-diagonal and occluded Y graft-OM2, without significant change from prior, elevated LVEDP.   Cardiomyopathy ([place])    EF prev normal 2004, 2008 // EF 35% by cath in 07/2017 // Echo 8/19:  Mild conc LVH, EF 40-45, diff HK, Gr 1 DD, mild AI, mild MR, mod LAE, normal RVSF, mild RAE, mild TR   Chest pain with moderate risk of acute coronary syndrome 07/24/2017    Chronic systolic CHF (congestive heart failure) (HCC)    Erectile dysfunction    GERD (gastroesophageal reflux disease)    on meds   Hypercholesteremia    Hyperlipidemia    on meds   Hypertension    on meds   Sleep apnea    does not wear CPAP mask   Tubular adenoma     Past Surgical History:  Procedure Laterality Date   CARDIAC CATHETERIZATION     X 1 stent in 2008 due to heart valve collapse after CABG   COLONOSCOPY N/A 11/10/2020   Procedure: COLONOSCOPY;  Surgeon: Rogene Houston, MD;  [facility]: [facility];  Service: Endoscopy;  Laterality: N/A;  10:00   CORONARY ARTERY BYPASS GRAFT  2008   x4 vessels   EYE SURGERY Bilateral    Lasik   KNEE ARTHROPLASTY Left    LEFT HEART CATH AND CORS/GRAFTS ANGIOGRAPHY N/A 07/25/2017   Procedure: LEFT HEART CATH AND CORS/GRAFTS ANGIOGRAPHY;  Surgeon: Lorretta Harp, MD;  Location: [facility];  Service: Cardiovascular;  Laterality: N/A;   POLYPECTOMY  11/10/2020   Procedure: POLYPECTOMY;  Surgeon: Rogene Houston, MD;  [facility]: [facility];  Service: Endoscopy;;   SHOULDER SURGERY Right    TOTAL KNEE ARTHROPLASTY Right 08/25/2014   Procedure: RIGHT TOTAL KNEE ARTHROPLASTY;  Surgeon: Kathryne Hitch, MD;  Location: [facility];  Service: Orthopedics;  Laterality: Right;    Family  History  Problem Relation Age of Onset   Heart failure Father    Heart disease Father    Hypertension Mother    Colon polyps Neg Hx    Colon cancer Neg Hx    Esophageal cancer Neg Hx    Rectal cancer Neg Hx    Stomach cancer Neg Hx     Social History   Socioeconomic History   Marital status: Married    Spouse name: Not on file   Number of children: Not on file   Years of education: Not on file   Highest education level: Not on file  Occupational History   Occupation: Farming    Comment: owned multiple businesses; owns a bar, Barrister's clerk  Tobacco Use   Smoking status: Never   Smokeless tobacco: Never  Scientific laboratory technician Use:  Never used  Substance and Sexual Activity   Alcohol use: Not Currently    Comment: 1-2 times per month   Drug use: No   Sexual activity: Yes    Partners: Female  Other Topics Concern   Not on file  Social History Narrative   Married for 2 years.Lives with wife and step daughter.Originally born [place].Retired,business entrepeuer-owns several companies.   Social Determinants of Health   Financial Resource Strain: Low Risk  (04/04/2021)   Overall Financial Resource Strain (CARDIA)    Difficulty of Paying Living Expenses: Not hard at all  Food Insecurity: No Food Insecurity (04/04/2021)   Hunger Vital Sign    Worried About Running Out of Food in the Last Year: Never true    Ran Out of Food in the Last Year: Never true  Transportation Needs: No Transportation Needs (04/04/2021)   PRAPARE - Hydrologist (Medical): No    Lack of Transportation (Non-Medical): No  Physical Activity: Sufficiently Active (04/04/2021)   Exercise Vital Sign    Days of Exercise per Week: 4 days    Minutes of Exercise per Session: 60 min  Stress: No Stress Concern Present (04/04/2021)   [place]    Feeling of Stress : Not at all  Social Connections: Moderately Isolated (04/04/2021)   Social Connection and Isolation Panel [NHANES]    Frequency of Communication with Friends and Family: More than three times a week    Frequency of Social Gatherings with Friends and Family: Twice a week    Attends Religious Services: Never    Marine scientist or Organizations: No    Attends Archivist Meetings: Never    Marital Status: Married  Human resources officer Violence: Not At Risk (04/04/2021)   Humiliation, Afraid, Rape, and Kick questionnaire    Fear of Current or Ex-Partner: No    Emotionally Abused: No    Physically Abused: No    Sexually Abused: No    Outpatient Medications Prior to Visit   Medication Sig Dispense Refill   aspirin EC 81 MG tablet Take 1 tablet (81 mg total) by mouth daily. Swallow whole.     B Complex-C (SUPER B COMPLEX PO) Take 1 capsule by mouth daily.     Cholecalciferol (VITAMIN D3) 125 MCG (5000 UT) CAPS Take 10,000 Units by mouth daily.      Coenzyme Q10 (COQ10) 100 MG CAPS Take 100 mg by mouth daily.     Cyanocobalamin (B-12 PO) Take 1 tablet by mouth daily.      diclofenac Sodium (VOLTAREN) 1 % GEL Apply  2 g topically 4 (four) times daily. 50 g 0   DULoxetine (CYMBALTA) 30 MG capsule Take 1 capsule (30 mg total) by mouth daily. Take 30 mg daily x 1 week, then 60 mg daily- for nerve and back pain. 768 capsule 1   GARLIC PO Take 1 tablet by mouth daily.     Ginkgo Biloba 40 MG TABS Take 40 mg by mouth daily.     meclizine (ANTIVERT) 25 MG tablet Take 1 tablet (25 mg total) by mouth 3 (three) times daily as needed for dizziness. 30 tablet 0   methocarbamol (ROBAXIN) 500 MG tablet Take 1 tablet (500 mg total) by mouth 2 (two) times daily. 20 tablet 0   metoprolol succinate (TOPROL XL) 25 MG 24 hr tablet Take 0.5 tablets (12.5 mg total) by mouth daily. 45 tablet 3   Multiple Vitamins-Minerals (MULTIVITAMIN PO) Take 1 tablet by mouth daily.      nitroGLYCERIN (NITROSTAT) 0.4 MG SL tablet PLACE 1 TABLET UNDER THE TONGUE EVERY 5 (FIVE) MINUTES X 3 DOSES AS NEEDED FOR CHEST PAIN. 25 tablet 3   pantoprazole (PROTONIX) 40 MG tablet Take 1 tablet (40 mg total) by mouth daily as needed. 90 tablet 1   rosuvastatin (CRESTOR) 40 MG tablet Take 1 tablet (40 mg total) by mouth daily. 90 tablet 3   sacubitril-valsartan (ENTRESTO) 97-103 MG Take 1 tablet by mouth 2 (two) times daily. 180 tablet 3   TURMERIC PO Take 500 mg by mouth daily.      vitamin C (ASCORBIC ACID) 500 MG tablet Take 500 mg by mouth daily.     VITAMIN E PO Take 400 mg by mouth daily.      Facility-Administered Medications Prior to Visit  Medication Dose Route Frequency Provider Last Rate Last Admin    betamethasone acetate-betamethasone sodium phosphate (CELESTONE) injection 9 mg  9 mg Other Once Evelina Bucy, DPM        Allergies  Allergen Reactions   Penicillins Itching and Rash    Other reaction(s): Other (See Comments) Has patient had a PCN reaction causing immediate rash, facial/tongue/throat swelling, SOB or lightheadedness with hypotension: No Has patient had a PCN reaction causing severe rash involving mucus membranes or skin necrosis: No Has patient had a PCN reaction that required hospitalization: No Has patient had a PCN reaction occurring within the last 10 years: No If all of the above answers are "NO", then may proceed with Cephalosporin use.    ROS Review of Systems  Constitutional:  Negative for fatigue and fever.  HENT:  Negative for sinus pressure, sinus pain and sneezing.   Eyes:  Negative for photophobia, pain and redness.  Respiratory:  Negative for chest tightness and shortness of breath.   Cardiovascular:  Negative for chest pain and palpitations.  Gastrointestinal:  Negative for diarrhea, nausea and vomiting.  Endocrine: Negative for polydipsia, polyphagia and polyuria.  Genitourinary:  Negative for frequency and urgency.  Musculoskeletal:  Positive for arthralgias and joint swelling.  Neurological:  Positive for dizziness.  Hematological:  Does not bruise/bleed easily.  Psychiatric/Behavioral:  Negative for confusion, self-injury and suicidal ideas.       Objective:    Physical Exam HENT:     Head: Normocephalic.     Right Ear: External ear normal.     Left Ear: External ear normal.     Nose: No congestion.     Mouth/Throat:     Mouth: Mucous membranes are moist.  Eyes:     Extraocular  Movements: Extraocular movements intact.     Pupils: Pupils are equal, round, and reactive to light.  Cardiovascular:     Rate and Rhythm: Normal rate and regular rhythm.     Pulses: Normal pulses.     Heart sounds: Normal heart sounds.  Pulmonary:      Effort: Pulmonary effort is normal.     Breath sounds: Normal breath sounds.  Abdominal:     Palpations: Abdomen is soft.  Musculoskeletal:     Cervical back: No rigidity.     Right lower leg: No edema.     Left lower leg: No edema.     Comments:  Positive tinel test  Skin:    General: Skin is warm.     Findings: No lesion or rash.  Neurological:     Mental Status: He is alert and oriented to person, place, and time.  Psychiatric:     Comments: Normal affect     BP 134/83   Pulse 74   Ht '5\' 6"'  (1.676 m)   Wt 191 lb 6.4 oz (86.8 kg)   SpO2 98%   BMI 30.89 kg/m  Wt Readings from Last 3 Encounters:  10/25/21 191 lb 6.4 oz (86.8 kg)  06/01/21 184 lb (83.5 kg)  04/05/21 191 lb 6.4 oz (86.8 kg)    Lab Results  Component Value Date   TSH 1.02 06/30/2019   Lab Results  Component Value Date   WBC 7.2 06/01/2021   HGB 15.4 06/01/2021   HCT 45.5 06/01/2021   MCV 93.2 06/01/2021   PLT 271 06/01/2021   Lab Results  Component Value Date   NA 138 06/01/2021   K 4.0 06/01/2021   CO2 25 06/01/2021   GLUCOSE 142 (H) 06/01/2021   BUN 17 06/01/2021   CREATININE 0.87 06/01/2021   BILITOT 0.6 11/15/2020   ALKPHOS 66 11/15/2020   AST 19 11/15/2020   ALT 19 11/15/2020   PROT 6.9 11/15/2020   ALBUMIN 4.3 11/15/2020   CALCIUM 9.5 06/01/2021   ANIONGAP 10 06/01/2021   EGFR 88 11/15/2020   GFR 89.73 08/31/2013   Lab Results  Component Value Date   CHOL 148 11/15/2020   Lab Results  Component Value Date   HDL 45 11/15/2020   Lab Results  Component Value Date   LDLCALC 71 11/15/2020   Lab Results  Component Value Date   TRIG 193 (H) 11/15/2020   Lab Results  Component Value Date   CHOLHDL 2.9 10/12/2019   Lab Results  Component Value Date   HGBA1C 6.2 (H) 11/15/2020      Assessment & Plan:   Problem List Items Addressed This Visit       Digestive   Esophageal reflux    C/o of abdominal gurgling and gas Reports occasional acid reflux      Relevant  Medications   omeprazole (PRILOSEC) 20 MG capsule     Musculoskeletal and Integument   Hand arthritis    C/o of pain and swelling of the right wrist at the radial zone Likely carpal tunnel than arthritis Positive tinel test He wears a hand brace for support and symptom relief Will refer to orthopedic for further evaluation        Other   Vitamin D deficiency   Relevant Orders   Vitamin D (25 hydroxy)   Vertigo    C/o of occasional dizziness Reports inadequate fluids intake Encouraged to increase his fluids intake and take meclizine as needed  Right foot pain    Onset of injury was in April 2023--reports seeing orthopedics in [place] with imaging studies done  Conservative measures were advised Since the ankle sprain, pt has noticed pain and swelling of the injured ankle with prolonged ambulation Conservative measures encouraged Referred to orthopedic for further evaluation      Other Visit Diagnoses     Sprain of right ankle, unspecified ligament, sequela    -  Primary   Relevant Orders   Ambulatory referral to Orthopedic Surgery   IFG (impaired fasting glucose)       Relevant Orders   Hemoglobin A1C   Encounter for general adult medical examination with abnormal findings       Relevant Orders   CBC with Differential/Platelet   CMP14+EGFR   Lipid panel   TSH + free T4   Carpal tunnel syndrome of right wrist       Relevant Orders   Ambulatory referral to Orthopedic Surgery       Meds ordered this encounter  Medications   omeprazole (PRILOSEC) 20 MG capsule    Sig: Take 1 capsule (20 mg total) by mouth daily.    Dispense:  30 capsule    Refill:  3    Follow-up: Return in about 3 months (around 01/25/2022).    Alvira Monday, FNP

## 2021-10-26 ENCOUNTER — Encounter: Payer: Self-pay | Admitting: Orthopedic Surgery

## 2021-10-26 DIAGNOSIS — Z0001 Encounter for general adult medical examination with abnormal findings: Secondary | ICD-10-CM | POA: Diagnosis not present

## 2021-10-26 DIAGNOSIS — E559 Vitamin D deficiency, unspecified: Secondary | ICD-10-CM | POA: Diagnosis not present

## 2021-10-26 DIAGNOSIS — R7301 Impaired fasting glucose: Secondary | ICD-10-CM | POA: Diagnosis not present

## 2021-10-26 NOTE — Assessment & Plan Note (Signed)
Onset of injury was in April 2023--reports seeing orthopedics in [place] with imaging studies done  Conservative measures were advised Since the ankle sprain, pt has noticed pain and swelling of the injured ankle with prolonged ambulation Conservative measures encouraged Referred to orthopedic for further evaluation

## 2021-10-26 NOTE — Assessment & Plan Note (Signed)
C/o of abdominal gurgling and gas Reports occasional acid reflux

## 2021-10-26 NOTE — Assessment & Plan Note (Addendum)
C/o of pain and swelling of the right wrist at the radial zone Likely carpal tunnel than arthritis Positive tinel test He wears a hand brace for support and symptom relief Will refer to orthopedic for further evaluation

## 2021-10-26 NOTE — Assessment & Plan Note (Signed)
C/o of occasional dizziness Reports inadequate fluids intake Encouraged to increase his fluids intake and take meclizine as needed

## 2021-10-27 LAB — CMP14+EGFR
ALT: 20 IU/L (ref 0–44)
AST: 18 IU/L (ref 0–40)
Albumin/Globulin Ratio: 1.6 (ref 1.2–2.2)
Albumin: 4.2 g/dL (ref 3.9–4.9)
Alkaline Phosphatase: 73 IU/L (ref 44–121)
BUN/Creatinine Ratio: 18 (ref 10–24)
BUN: 16 mg/dL (ref 8–27)
Bilirubin Total: 0.5 mg/dL (ref 0.0–1.2)
CO2: 21 mmol/L (ref 20–29)
Calcium: 9.5 mg/dL (ref 8.6–10.2)
Chloride: 103 mmol/L (ref 96–106)
Creatinine, Ser: 0.89 mg/dL (ref 0.76–1.27)
Globulin, Total: 2.6 g/dL (ref 1.5–4.5)
Glucose: 105 mg/dL — ABNORMAL HIGH (ref 70–99)
Potassium: 4.9 mmol/L (ref 3.5–5.2)
Sodium: 142 mmol/L (ref 134–144)
Total Protein: 6.8 g/dL (ref 6.0–8.5)
eGFR: 95 mL/min/{1.73_m2} (ref >59)

## 2021-10-27 LAB — VITAMIN D 25 HYDROXY (VIT D DEFICIENCY, FRACTURES): Vit D, 25-Hydroxy: 25.6 ng/mL — ABNORMAL LOW (ref 30.0–100.0)

## 2021-10-27 LAB — CBC WITH DIFFERENTIAL/PLATELET
Basophils Absolute: 0.1 10*3/uL (ref 0.0–0.2)
Basos: 1 %
EOS (ABSOLUTE): 0.3 10*3/uL (ref 0.0–0.4)
Eos: 4 %
Hematocrit: 45.6 % (ref 37.5–51.0)
Hemoglobin: 15.1 g/dL (ref 13.0–17.7)
Immature Grans (Abs): 0 10*3/uL (ref 0.0–0.1)
Immature Granulocytes: 0 %
Lymphocytes Absolute: 2.1 10*3/uL (ref 0.7–3.1)
Lymphs: 31 %
MCH: 30.9 pg (ref 26.6–33.0)
MCHC: 33.1 g/dL (ref 31.5–35.7)
MCV: 93 fL (ref 79–97)
Monocytes Absolute: 0.8 10*3/uL (ref 0.1–0.9)
Monocytes: 11 %
Neutrophils Absolute: 3.5 10*3/uL (ref 1.4–7.0)
Neutrophils: 53 %
Platelets: 295 10*3/uL (ref 150–450)
RBC: 4.89 x10E6/uL (ref 4.14–5.80)
RDW: 13.1 % (ref 11.6–15.4)
WBC: 6.6 10*3/uL (ref 3.4–10.8)

## 2021-10-27 LAB — TSH+FREE T4
Free T4: 1.04 ng/dL (ref 0.82–1.77)
TSH: 2.5 u[IU]/mL (ref 0.450–4.500)

## 2021-10-27 LAB — LIPID PANEL
Chol/HDL Ratio: 2.9 ratio (ref 0.0–5.0)
Cholesterol, Total: 128 mg/dL (ref 100–199)
HDL: 44 mg/dL (ref >39)
LDL Chol Calc (NIH): 61 mg/dL (ref 0–99)
Triglycerides: 128 mg/dL (ref 0–149)
VLDL Cholesterol Cal: 23 mg/dL (ref 5–40)

## 2021-10-27 LAB — HEMOGLOBIN A1C
Est. average glucose Bld gHb Est-mCnc: 134 mg/dL
Hgb A1c MFr Bld: 6.3 % — ABNORMAL HIGH (ref 4.8–5.6)

## 2021-10-29 NOTE — Progress Notes (Signed)
Please inform the patient that he has prediabetes. I recommend low calorie and sugar diet.   I recommend taking Vit.D OTC 5000iu daily. He is deficient in Vit. D

## 2021-11-14 ENCOUNTER — Ambulatory Visit (INDEPENDENT_AMBULATORY_CARE_PROVIDER_SITE_OTHER): Payer: Medicare Other

## 2021-11-14 ENCOUNTER — Encounter: Payer: Self-pay | Admitting: Orthopedic Surgery

## 2021-11-14 ENCOUNTER — Ambulatory Visit (INDEPENDENT_AMBULATORY_CARE_PROVIDER_SITE_OTHER): Payer: Medicare Other | Admitting: Orthopedic Surgery

## 2021-11-14 VITALS — BP 111/75 | HR 68 | Ht 66.0 in | Wt 187.0 lb

## 2021-11-14 DIAGNOSIS — M25571 Pain in right ankle and joints of right foot: Secondary | ICD-10-CM | POA: Diagnosis not present

## 2021-11-14 DIAGNOSIS — S93491A Sprain of other ligament of right ankle, initial encounter: Secondary | ICD-10-CM

## 2021-11-14 NOTE — Patient Instructions (Signed)
Instructions ° °1.  You have sustained an ankle sprain, or similar exercises that can be treated as an ankle sprain.  **These exercises can also be used as part of recovery from an ankle fracture.  °2.  I encourage you to stay on your feet and gradually remove your walking boot.   °3.  Below are some exercises that you can complete on your own to improve your symptoms.  °4.  As an alternative, you can search for ankle sprain exercises online, and can see some demonstrations on YouTube  °5.  If you are having difficulty with these exercises, we can also prescribe formal physical therapy ° °Ankle Exercises °Ask your health care provider which exercises are safe for you. Do exercises exactly as told by your health care provider and adjust them as directed. It is normal to feel mild stretching, pulling, tightness, or mild discomfort as you do these exercises. Stop right away if you feel sudden pain or your pain gets worse. Do not begin these exercises until told by your health care provider. ° °Stretching and range-of-motion exercises °These exercises warm up your muscles and joints and improve the movement and flexibility of your ankle. These exercises may also help to relieve pain. ° °Dorsiflexion/plantar flexion ° °Sit with your R knee straight or bent. Do not rest your foot on anything. °Flex your left ankle to tilt the top of your foot toward your shin. This is called dorsiflexion. °Hold this position for 5 seconds. °Point your toes downward to tilt the top of your foot away from your shin. This is called plantar flexion. °Hold this position for 5 seconds. °Repeat 10 times. Complete this exercise 2-3 times a day.  As tolerated ° °Ankle alphabet ° °Sit with your R foot supported at your lower leg. °Do not rest your foot on anything. °Make sure your foot has room to move freely. °Think of your R foot as a paintbrush: °Move your foot to trace each letter of the alphabet in the air. Keep your hip and knee still while  you trace the letters. Trace every letter from A to Z. °Make the letters as large as you can without causing or increasing any discomfort. ° °Repeat 2-3 times. Complete this exercise 2-3 times a day. ° ° °Strengthening exercises °These exercises build strength and endurance in your ankle. Endurance is the ability to use your muscles for a long time, even after they get tired. °Dorsiflexors °These are muscles that lift your foot up. °Secure a rubber exercise band or tube to an object, such as a table leg, that will stay still when the band is pulled. Secure the other end around your R foot. °Sit on the floor, facing the object with your R leg extended. The band or tube should be slightly tense when your foot is relaxed. °Slowly flex your R ankle and toes to bring your foot toward your shin. °Hold this position for 5 seconds. °Slowly return your foot to the starting position, controlling the band as you do that. °Repeat 10 times. Complete this exercise 2-3 times a day. ° °Plantar flexors °These are muscles that push your foot down. °Sit on the floor with your R leg extended. °Loop a rubber exercise band or tube around the ball of your R foot. The ball of your foot is on the walking surface, right under your toes. The band or tube should be slightly tense when your foot is relaxed. °Slowly point your toes downward, pushing them away from   you. °Hold this position for 5 seconds. °Slowly release the tension in the band or tube, controlling smoothly until your foot is back in the starting position. °Repeat 10 times. Complete this exercise 2-3 times a day. ° °Towel curls ° °Sit in a chair on a non-carpeted surface, and put your feet on the floor. °Place a towel in front of your feet. °Keeping your heel on the floor, put your R foot on the towel. °Pull the towel toward you by grabbing the towel with your toes and curling them under. Keep your heel on the floor. °Let your toes relax. °Grab the towel again. Keep pulling the  towel until it is completely underneath your foot. °Repeat 10 times. Complete this exercise 2-3 times a day. ° °Standing plantar flexion °This is an exercise in which you use your toes to lift your body's weight while standing. °Stand with your feet shoulder-width apart. °Keep your weight spread evenly over the width of your feet while you rise up on your toes. Use a wall or table to steady yourself if needed, but try not to use it for support. °If this exercise is too easy, try these options: °Shift your weight toward your R leg until you feel challenged. °If told by your health care provider, lift your uninjured leg off the floor. °Hold this position for 5 seconds. °Repeat 10 times. Complete this exercise 2-3 times a day. ° °Tandem walking °Stand with one foot directly in front of the other. °Slowly raise your back foot up, lifting your heel before your toes, and place it directly in front of your other foot. °Continue to walk in this heel-to-toe way. Have a countertop or wall nearby to use if needed to keep your balance, but try not to hold onto anything for support. ° °Repeat 10 times. Complete this exercise 2-3 times a day. ° ° °Document Revised: 12/28/2017 Document Reviewed: 12/30/2017 °[employer]vier Patient Education © 2020 [employer] ° °

## 2021-11-14 NOTE — Progress Notes (Unsigned)
e

## 2021-11-15 ENCOUNTER — Encounter: Payer: Self-pay | Admitting: Orthopedic Surgery

## 2021-11-15 NOTE — Progress Notes (Signed)
New Patient Visit  Assessment: Scott Roth is a 66 y.o. male with the following: 1. Sprain of anterior talofibular ligament of right ankle, initial encounter  Plan: RONIE BARNHART has right ankle discomfort.  He sustained an injury several months ago.  The pain has lingered.  Radiographs today are negative.  Nothing concerning on physical exam.  Encouraged him to remain active.  Provided him with simple exercises.  He can also continue with ankle sprain exercises that can be found online or on YouTube.  He states his understanding.  Medication as needed.  Follow-up as needed.  Follow-up: Return if symptoms worsen or fail to improve.  Subjective:  Chief Complaint  Patient presents with   Ankle Pain    RT ankle DOI April jumped off the bed of a truck and landed in a hole    History of Present Illness: Scott Roth is a 66 y.o. male who presents for evaluation of right ankle pain.  He states he jumped off a truck bed, approximately 3-4 months ago, and when he landed, he inverted his right ankle.  He did have pain at the time.  This progressively improved.  He continues to have some pain, primarily in the anterior and lateral aspect of the right ankle.  He is not taking medications.  He is wearing regular shoe.  However, with some motions, he continues to have discomfort.  Due to the residual pain, he decided to present for evaluation.  He has not had physical therapy.  He is not taking medications on a consistent basis.  He remains very active with martial arts.   Review of Systems: No fevers or chills No numbness or tingling No chest pain No shortness of breath No bowel or bladder dysfunction No GI distress No headaches   Medical History:  Past Medical History:  Diagnosis Date   Allergy    seasonal allergies   Arthritis    bilateral hands, knees, shoulders   CAD (coronary artery disease)    a. CABG x4 with LIMA to dLAD, free RIMA to OM1, SVG to D1, SVG to  OM2 in 2006. b. PCI 2008 with notable ventricular fibrillation during cath) - DES to prox LAD/1st diagonal. c. Cath 07/25/17 demonstrated previously placed stents, patent LIMA-LAD, patent free RIMA-OM, occluded VG-diagonal and occluded Y graft-OM2, without significant change from prior, elevated LVEDP.   Cardiomyopathy ([place])    EF prev normal 2004, 2008 // EF 35% by cath in 07/2017 // Echo 8/19:  Mild conc LVH, EF 40-45, diff HK, Gr 1 DD, mild AI, mild MR, mod LAE, normal RVSF, mild RAE, mild TR   Chest pain with moderate risk of acute coronary syndrome 60/63/0160   Chronic systolic CHF (congestive heart failure) (HCC)    Erectile dysfunction    GERD (gastroesophageal reflux disease)    on meds   Hypercholesteremia    Hyperlipidemia    on meds   Hypertension    on meds   Sleep apnea    does not wear CPAP mask   Tubular adenoma     Past Surgical History:  Procedure Laterality Date   CARDIAC CATHETERIZATION     X 1 stent in 2008 due to heart valve collapse after CABG   COLONOSCOPY N/A 11/10/2020   Procedure: COLONOSCOPY;  Surgeon: Rogene Houston, MD;  [facility];  Service: Endoscopy;  Laterality: N/A;  10:00   CORONARY ARTERY BYPASS GRAFT  2008   x4 vessels   EYE SURGERY  Bilateral    Lasik   KNEE ARTHROPLASTY Left    LEFT HEART CATH AND CORS/GRAFTS ANGIOGRAPHY N/A 07/25/2017   Procedure: LEFT HEART CATH AND CORS/GRAFTS ANGIOGRAPHY;  Surgeon: Lorretta Harp, MD;  Location: [place] [facility];  Service: Cardiovascular;  Laterality: N/A;   POLYPECTOMY  11/10/2020   Procedure: POLYPECTOMY;  Surgeon: Rogene Houston, MD;  [facility];  Service: Endoscopy;;   SHOULDER SURGERY Right    TOTAL KNEE ARTHROPLASTY Right 08/25/2014   Procedure: RIGHT TOTAL KNEE ARTHROPLASTY;  Surgeon: Kathryne Hitch, MD;  Location: [place];  Service: Orthopedics;  Laterality: Right;    Family History  Problem Relation Age of Onset   Heart failure Father    Heart disease Father     Hypertension Mother    Colon polyps Neg Hx    Colon cancer Neg Hx    Esophageal cancer Neg Hx    Rectal cancer Neg Hx    Stomach cancer Neg Hx    Social History   Tobacco Use   Smoking status: Never   Smokeless tobacco: Never  Vaping Use   Vaping Use: Never used  Substance Use Topics   Alcohol use: Not Currently    Comment: 1-2 times per month   Drug use: No    Allergies  Allergen Reactions   Penicillins Itching and Rash    Other reaction(s): Other (See Comments) Has patient had a PCN reaction causing immediate rash, facial/tongue/throat swelling, SOB or lightheadedness with hypotension: No Has patient had a PCN reaction causing severe rash involving mucus membranes or skin necrosis: No Has patient had a PCN reaction that required hospitalization: No Has patient had a PCN reaction occurring within the last 10 years: No If all of the above answers are "NO", then may proceed with Cephalosporin use.    Current Meds  Medication Sig   aspirin EC 81 MG tablet Take 1 tablet (81 mg total) by mouth daily. Swallow whole.   B Complex-C (SUPER B COMPLEX PO) Take 1 capsule by mouth daily.   Cholecalciferol (VITAMIN D3) 125 MCG (5000 UT) CAPS Take 10,000 Units by mouth daily.    Coenzyme Q10 (COQ10) 100 MG CAPS Take 100 mg by mouth daily.   Cyanocobalamin (B-12 PO) Take 1 tablet by mouth daily.    diclofenac Sodium (VOLTAREN) 1 % GEL Apply 2 g topically 4 (four) times daily.   DULoxetine (CYMBALTA) 30 MG capsule Take 1 capsule (30 mg total) by mouth daily. Take 30 mg daily x 1 week, then 60 mg daily- for nerve and back pain.   GARLIC PO Take 1 tablet by mouth daily.   Ginkgo Biloba 40 MG TABS Take 40 mg by mouth daily.   meclizine (ANTIVERT) 25 MG tablet Take 1 tablet (25 mg total) by mouth 3 (three) times daily as needed for dizziness.   methocarbamol (ROBAXIN) 500 MG tablet Take 1 tablet (500 mg total) by mouth 2 (two) times daily.   metoprolol succinate (TOPROL XL) 25 MG 24 hr tablet  Take 0.5 tablets (12.5 mg total) by mouth daily.   Multiple Vitamins-Minerals (MULTIVITAMIN PO) Take 1 tablet by mouth daily.    nitroGLYCERIN (NITROSTAT) 0.4 MG SL tablet PLACE 1 TABLET UNDER THE TONGUE EVERY 5 (FIVE) MINUTES X 3 DOSES AS NEEDED FOR CHEST PAIN.   omeprazole (PRILOSEC) 20 MG capsule Take 1 capsule (20 mg total) by mouth daily.   pantoprazole (PROTONIX) 40 MG tablet Take 1 tablet (40 mg total) by mouth daily as  needed.   rosuvastatin (CRESTOR) 40 MG tablet Take 1 tablet (40 mg total) by mouth daily.   sacubitril-valsartan (ENTRESTO) 97-103 MG Take 1 tablet by mouth 2 (two) times daily.   TURMERIC PO Take 500 mg by mouth daily.    vitamin C (ASCORBIC ACID) 500 MG tablet Take 500 mg by mouth daily.   VITAMIN E PO Take 400 mg by mouth daily.    Current Facility-Administered Medications for the 11/14/21 encounter (Office Visit) with Mordecai Rasmussen, MD  Medication   betamethasone acetate-betamethasone sodium phosphate (CELESTONE) injection 9 mg    Objective: BP 111/75   Pulse 68   Ht '5\' 6"'$  (1.676 m)   Wt 187 lb (84.8 kg)   BMI 30.18 kg/m   Physical Exam:  Roth: Alert and oriented. and No acute distress. Gait: Normal gait.  Right ankle without swelling.  No bruising.  Full range of motion.  Mild discomfort with inversion of the ankle.  No obvious instability.  Toes are warm and well-perfused.  2+ DP pulse.  Sensation is intact over the dorsum of the foot.  IMAGING: I personally ordered and reviewed the following images  X-rays of the right ankle were obtained in clinic today.  No acute injuries noted.  No evidence of a prior fracture.  There is no talar tilt.  Well-maintained tibiotalar joint space.  The mortise is congruent.  No syndesmotic disruption.  Impression: Normal right ankle x-ray  New Medications:  No orders of the defined types were placed in this encounter.     Mordecai Rasmussen, MD  11/15/2021 7:38 AM

## 2022-02-09 ENCOUNTER — Ambulatory Visit: Payer: Medicare Other | Admitting: Family Medicine

## 2022-02-22 ENCOUNTER — Telehealth: Payer: Self-pay | Admitting: Interventional Cardiology

## 2022-02-22 NOTE — Telephone Encounter (Signed)
Returned call to patient.  Patient states he felt SOB and dizzy this morning when he took his medications. He reports BP this morning was 104/77. He states he lied down and SOB/dizziness subsided.  Patient states this has been happening for the past 2 months, but this morning was the worst case. He states "mornings is usually when it hits me." He states it usually does not happen when he waits a little later after eating something to take his medications.  Patient reports he has recently lost some weight and has been eating more healthy.  Patient has appt with Ambrose Pancoast, NP on 02/26/22 for evaluation.  Advised on ED precautions, patient verbalized understanding.

## 2022-02-22 NOTE — Telephone Encounter (Signed)
Pt c/o Shortness Of Breath: STAT if SOB developed within the last 24 hours or pt is noticeably SOB on the phone  1. Are you currently SOB (can you hear that pt is SOB on the phone)? Not at this time  2. How long have you been experiencing SOB? Had episode today after he took his medicine  3. Are you SOB when sitting or when up moving around?   4. Are you currently experiencing any other symptoms? No energy at all today- also some dizziness- just a few minutes blood pressure is running low - patient wanted an appointment- I made him an appointment ob 02-26-22([place]) with Ambrose Pancoast

## 2022-02-25 NOTE — Progress Notes (Unsigned)
Office Visit    Patient Name: [name] Date of Encounter: 02/25/2022  Primary Care Provider:  Alvira Monday, [place] Primary Cardiologist:  Sinclair Grooms, MD Primary Electrophysiologist: None  Chief Complaint    Scott Roth is a 66 y.o. male with PMH of CAD (CABG x4 with LIMA to dLAD, free RIMA to OM1, SVG to D1, SVG to OM2 in 2006, PCI 2008 with notable ventricular fibrillation during cath), HTN, HLD, OSA not on CPAP, ED, arthritis who presents today for dizziness and shortness of breath.  Past Medical History    Past Medical History:  Diagnosis Date   Allergy    seasonal allergies   Arthritis    bilateral hands, knees, shoulders   CAD (coronary artery disease)    a. CABG x4 with LIMA to dLAD, free RIMA to OM1, SVG to D1, SVG to OM2 in 2006. b. PCI 2008 with notable ventricular fibrillation during cath) - DES to prox LAD/1st diagonal. c. Cath 07/25/17 demonstrated previously placed stents, patent LIMA-LAD, patent free RIMA-OM, occluded VG-diagonal and occluded Y graft-OM2, without significant change from prior, elevated LVEDP.   Cardiomyopathy ([place])    EF prev normal 2004, 2008 // EF 35% by cath in 07/2017 // Echo 8/19:  Mild conc LVH, EF 40-45, diff HK, Gr 1 DD, mild AI, mild MR, mod LAE, normal RVSF, mild RAE, mild TR   Chest pain with moderate risk of acute coronary syndrome 09/73/5329   Chronic systolic CHF (congestive heart failure) (HCC)    Erectile dysfunction    GERD (gastroesophageal reflux disease)    on meds   Hypercholesteremia    Hyperlipidemia    on meds   Hypertension    on meds   Sleep apnea    does not wear CPAP mask   Tubular adenoma    Past Surgical History:  Procedure Laterality Date   CARDIAC CATHETERIZATION     X 1 stent in 2008 due to heart valve collapse after CABG   COLONOSCOPY N/A 11/10/2020   Procedure: COLONOSCOPY;  Surgeon: Rogene Houston, MD;  [facility];  Service: Endoscopy;  Laterality: N/A;  10:00    CORONARY ARTERY BYPASS GRAFT  2008   x4 vessels   EYE SURGERY Bilateral    Lasik   KNEE ARTHROPLASTY Left    LEFT HEART CATH AND CORS/GRAFTS ANGIOGRAPHY N/A 07/25/2017   Procedure: LEFT HEART CATH AND CORS/GRAFTS ANGIOGRAPHY;  Surgeon: Lorretta Harp, MD;  Location: [place] [facility];  Service: Cardiovascular;  Laterality: N/A;   POLYPECTOMY  11/10/2020   Procedure: POLYPECTOMY;  Surgeon: Rogene Houston, MD;  [facility];  Service: Endoscopy;;   SHOULDER SURGERY Right    TOTAL KNEE ARTHROPLASTY Right 08/25/2014   Procedure: RIGHT TOTAL KNEE ARTHROPLASTY;  Surgeon: Kathryne Hitch, MD;  Location: [place];  Service: Orthopedics;  Laterality: Right;    Allergies  Allergies  Allergen Reactions   Penicillins Itching and Rash    Other reaction(s): Other (See Comments) Has patient had a PCN reaction causing immediate rash, facial/tongue/throat swelling, SOB or lightheadedness with hypotension: No Has patient had a PCN reaction causing severe rash involving mucus membranes or skin necrosis: No Has patient had a PCN reaction that required hospitalization: No Has patient had a PCN reaction occurring within the last 10 years: No If all of the above answers are "NO", then may proceed with Cephalosporin use.    History of Present Illness    Scott Vivier  Roth  is a 66 year old male with the above mention past medical history who presents today for complaint of dizziness and shortness of breath.  Cardiac history dates back to 2006 when he underwent CABG x4 and had subsequent PCI and left heart cath in 2008.  2D echo was completed at that time with EF of 50%.  Patient did develop VF during cath procedure that required DCCV.  He underwent Lexiscan for surgical clearance of right TKA in 07/2017 which showed EF 33%, medium defect of severe severity present in the apical anterior, apical lateral and apex location consistent with prior infarction, no ischemia present.  He underwent LHC due to new  LV dysfunction that showed similar findings of 2008 cath with no clear cause of decreased LV function.  He was treated with medical therapy with plan to repeat 2D echo for assessment of ICD candidacy if no improvement. Last 2D echo was completed 2022 that showed EF of 45-50% with mildly decreased LV function and global hypokinesis with grade 3 DD, normal RV systolic function with mild to moderate AV regurgitation and borderline aortic dilation with 37 mm ascending aortic dilation noted.  He was seen last by Dr. Tamala Julian on 03/2021 and had discontinued his beta-blocker therapy and this was resumed during visit.  The importance of medication compliance for survival of HFrEF was done at with recommendation to start SGLT2.  Patient remained reluctant to begin therapy at that time.   Since last being seen in the office patient reports***.  Patient denies chest pain, palpitations, dyspnea, PND, orthopnea, nausea, vomiting, dizziness, syncope, edema, weight gain, or early satiety.    ***Notes: -Patient has complaint of shortness of breath and dizziness -Patient should consider adding SGLT2 for cardio and renal protection or spironolactone Complete orthostatics*** Home Medications    Current Outpatient Medications  Medication Sig Dispense Refill   aspirin EC 81 MG tablet Take 1 tablet (81 mg total) by mouth daily. Swallow whole.     B Complex-C (SUPER B COMPLEX PO) Take 1 capsule by mouth daily.     Cholecalciferol (VITAMIN D3) 125 MCG (5000 UT) CAPS Take 10,000 Units by mouth daily.      Coenzyme Q10 (COQ10) 100 MG CAPS Take 100 mg by mouth daily.     Cyanocobalamin (B-12 PO) Take 1 tablet by mouth daily.      diclofenac Sodium (VOLTAREN) 1 % GEL Apply 2 g topically 4 (four) times daily. 50 g 0   DULoxetine (CYMBALTA) 30 MG capsule Take 1 capsule (30 mg total) by mouth daily. Take 30 mg daily x 1 week, then 60 mg daily- for nerve and back pain. 161 capsule 1   GARLIC PO Take 1 tablet by mouth daily.      Ginkgo Biloba 40 MG TABS Take 40 mg by mouth daily.     meclizine (ANTIVERT) 25 MG tablet Take 1 tablet (25 mg total) by mouth 3 (three) times daily as needed for dizziness. 30 tablet 0   methocarbamol (ROBAXIN) 500 MG tablet Take 1 tablet (500 mg total) by mouth 2 (two) times daily. 20 tablet 0   metoprolol succinate (TOPROL XL) 25 MG 24 hr tablet Take 0.5 tablets (12.5 mg total) by mouth daily. 45 tablet 3   Multiple Vitamins-Minerals (MULTIVITAMIN PO) Take 1 tablet by mouth daily.      nitroGLYCERIN (NITROSTAT) 0.4 MG SL tablet PLACE 1 TABLET UNDER THE TONGUE EVERY 5 (FIVE) MINUTES X 3 DOSES AS NEEDED FOR CHEST PAIN. 25 tablet  3   omeprazole (PRILOSEC) 20 MG capsule Take 1 capsule (20 mg total) by mouth daily. 30 capsule 3   pantoprazole (PROTONIX) 40 MG tablet Take 1 tablet (40 mg total) by mouth daily as needed. 90 tablet 1   rosuvastatin (CRESTOR) 40 MG tablet Take 1 tablet (40 mg total) by mouth daily. 90 tablet 3   sacubitril-valsartan (ENTRESTO) 97-103 MG Take 1 tablet by mouth 2 (two) times daily. 180 tablet 3   TURMERIC PO Take 500 mg by mouth daily.      vitamin C (ASCORBIC ACID) 500 MG tablet Take 500 mg by mouth daily.     VITAMIN E PO Take 400 mg by mouth daily.      Current Facility-Administered Medications  Medication Dose Route Frequency Provider Last Rate Last Admin   betamethasone acetate-betamethasone sodium phosphate (CELESTONE) injection 9 mg  9 mg Other Once Evelina Bucy, DPM         Review of Systems  Please see the history of present illness.    (+)*** (+)***  All other systems reviewed and are otherwise negative except as noted above.  Physical Exam    Wt Readings from Last 3 Encounters:  11/14/21 187 lb (84.8 kg)  10/25/21 191 lb 6.4 oz (86.8 kg)  06/01/21 184 lb (83.5 kg)   NW:GNFAO were no vitals filed for this visit.,There is no height or weight on file to calculate BMI.  Constitutional:      Appearance: Healthy appearance. Not in distress.   Neck:     Vascular: JVD normal.  Pulmonary:     Effort: Pulmonary effort is normal.     Breath sounds: No wheezing. No rales. Diminished in the bases Cardiovascular:     Normal rate. Regular rhythm. Normal S1. Normal S2.      Murmurs: There is no murmur.  Edema:    Peripheral edema absent.  Abdominal:     Palpations: Abdomen is soft non tender. There is no hepatomegaly.  Skin:    General: Skin is warm and dry.  Neurological:     General: No focal deficit present.     Mental Status: Alert and oriented to person, place and time.     Cranial Nerves: Cranial nerves are intact.  EKG/LABS/Other Studies Reviewed    ECG personally reviewed by me today - ***  Risk Assessment/Calculations:   {Does this patient have ATRIAL FIBRILLATION?[phone]}        Lab Results  Component Value Date   WBC 6.6 10/26/2021   HGB 15.1 10/26/2021   HCT 45.6 10/26/2021   MCV 93 10/26/2021   PLT 295 10/26/2021   Lab Results  Component Value Date   CREATININE 0.89 10/26/2021   BUN 16 10/26/2021   NA 142 10/26/2021   K 4.9 10/26/2021   CL 103 10/26/2021   CO2 21 10/26/2021   Lab Results  Component Value Date   ALT 20 10/26/2021   AST 18 10/26/2021   ALKPHOS 73 10/26/2021   BILITOT 0.5 10/26/2021   Lab Results  Component Value Date   CHOL 128 10/26/2021   HDL 44 10/26/2021   LDLCALC 61 10/26/2021   TRIG 128 10/26/2021   CHOLHDL 2.9 10/26/2021    Lab Results  Component Value Date   HGBA1C 6.3 (H) 10/26/2021    Assessment & Plan    1.  HFrEF/ICM: -Last 2D echo was completed 2022 that showed EF of 45-50% with mildly decreased LV function and global hypokinesis with grade 3 DD -Reports  shortness of breath with dizziness recently that resolved with rest.  Today patient reports*** -Continue current GDMT with Toprol XL 12.5 mg, Entresto 97/103 mg -  2.  Coronary artery disease: -s/p CABG x4 in 2006 with subsequent PCI and 2008.  LHC was performed 2019 that showed no change from  2008 cath but decreased LV function -Today patient reports*** -Continue current GDMT with ASA 81 mg, and Toprol 12.5 mg, Crestor 40 mg  3.  Hyperlipidemia: -Patient's last LDL was -Continue Crestor 40 mg daily  4.  Essential hypertension: -Patient noted dizziness and blood pressures today were***       Disposition: Follow-up with Belva Crome III, MD or APP in *** months {Are you ordering a CV Procedure (e.g. stress test, cath, DCCV, TEE, etc)?   Press F2        :[id number]}   Medication Adjustments/Labs and Tests Ordered: Current medicines are reviewed at length with the patient today.  Concerns regarding medicines are outlined above.   Signed, Mable Fill, Marissa Nestle, NP 02/25/2022, 12:59 PM [facility]  Note:  This document was prepared using Dragon voice recognition software and may include unintentional dictation errors.

## 2022-02-26 ENCOUNTER — Encounter: Payer: Self-pay | Admitting: Nurse Practitioner

## 2022-02-26 ENCOUNTER — Ambulatory Visit: Payer: Medicare Other | Attending: Nurse Practitioner | Admitting: Nurse Practitioner

## 2022-02-26 ENCOUNTER — Other Ambulatory Visit: Payer: Self-pay | Admitting: Nurse Practitioner

## 2022-02-26 VITALS — BP 110/60 | HR 72 | Ht 65.0 in | Wt 190.8 lb

## 2022-02-26 DIAGNOSIS — E785 Hyperlipidemia, unspecified: Secondary | ICD-10-CM

## 2022-02-26 DIAGNOSIS — I2510 Atherosclerotic heart disease of native coronary artery without angina pectoris: Secondary | ICD-10-CM

## 2022-02-26 DIAGNOSIS — I5022 Chronic systolic (congestive) heart failure: Secondary | ICD-10-CM | POA: Diagnosis not present

## 2022-02-26 DIAGNOSIS — I255 Ischemic cardiomyopathy: Secondary | ICD-10-CM

## 2022-02-26 DIAGNOSIS — I10 Essential (primary) hypertension: Secondary | ICD-10-CM | POA: Diagnosis not present

## 2022-02-26 MED ORDER — ROSUVASTATIN CALCIUM 10 MG PO TABS: 10.0000 mg | ORAL_TABLET | Freq: Every day | ORAL | 4 refills | Status: DC

## 2022-02-26 MED ORDER — COLCHICINE 0.6 MG PO TABS: 0.6000 mg | ORAL_TABLET | Freq: Every day | ORAL | 1 refills | Status: DC | PRN

## 2022-02-26 MED ORDER — EMPAGLIFLOZIN 10 MG PO TABS: 10.0000 mg | ORAL_TABLET | Freq: Every day | ORAL | 3 refills | Status: DC

## 2022-02-26 NOTE — Patient Instructions (Addendum)
Medication Instructions:  START Jardiance '10mg'$  Take 1 tablet daily  START Colchicine 0.'6mg'$  take as needed for gout flare ups DECREASE Crestor to '10mg'$  Take 1 tablet once a day *If you need a refill on your cardiac medications before your next appointment, please call your pharmacy*   Lab Work: 8 weeks-LFT & LIPIDS If you have labs (blood work) drawn today and your tests are completely normal, you will receive your results only by: [place] (if you have MyChart) OR A paper copy in the mail If you have any lab test that is abnormal or we need to change your treatment, we will call you to review the results.   Testing/Procedures: NONE ORDERED   Follow-Up: At [facility], you and your health needs are our priority.  As part of our continuing mission to provide you with exceptional heart care, we have created designated Provider Care Teams.  These Care Teams include your primary Cardiologist (physician) and Advanced Practice Providers (APPs -  Physician Assistants and Nurse Practitioners) who all work together to provide you with the care you need, when you need it.  We recommend signing up for the patient portal called "MyChart".  Sign up information is provided on this After Visit Summary.  MyChart is used to connect with patients for Virtual Visits (Telemedicine).  Patients are able to view lab/test results, encounter notes, upcoming appointments, etc.  Non-urgent messages can be sent to your provider as well.   To learn more about what you can do with MyChart, go to [email or web address].    Your next appointment:   3 month(s)  The format for your next appointment:   In Person  Provider:   Ambrose Pancoast, NP       Other Instructions   Important Information About Sugar

## 2022-02-27 NOTE — Addendum Note (Signed)
Addended by: Briant Cedar on: 02/27/2022 01:40 PM   Modules accepted: Orders

## 2022-03-06 ENCOUNTER — Ambulatory Visit: Payer: Medicare Other | Admitting: Orthopedic Surgery

## 2022-04-05 ENCOUNTER — Ambulatory Visit: Payer: Medicare Other | Admitting: Internal Medicine

## 2022-04-07 DIAGNOSIS — J069 Acute upper respiratory infection, unspecified: Secondary | ICD-10-CM | POA: Diagnosis not present

## 2022-04-07 DIAGNOSIS — Z6830 Body mass index (BMI) 30.0-30.9, adult: Secondary | ICD-10-CM | POA: Diagnosis not present

## 2022-04-07 DIAGNOSIS — E669 Obesity, unspecified: Secondary | ICD-10-CM | POA: Diagnosis not present

## 2022-04-23 ENCOUNTER — Encounter: Payer: Self-pay | Admitting: Internal Medicine

## 2022-04-23 ENCOUNTER — Ambulatory Visit (INDEPENDENT_AMBULATORY_CARE_PROVIDER_SITE_OTHER): Payer: Medicare Other | Admitting: Internal Medicine

## 2022-04-23 ENCOUNTER — Ambulatory Visit (INDEPENDENT_AMBULATORY_CARE_PROVIDER_SITE_OTHER): Payer: Medicare Other

## 2022-04-23 ENCOUNTER — Ambulatory Visit: Payer: PRIVATE HEALTH INSURANCE | Admitting: Internal Medicine

## 2022-04-23 VITALS — BP 122/83 | Ht 66.0 in | Wt 187.0 lb

## 2022-04-23 DIAGNOSIS — K219 Gastro-esophageal reflux disease without esophagitis: Secondary | ICD-10-CM | POA: Diagnosis not present

## 2022-04-23 DIAGNOSIS — Z0000 Encounter for general adult medical examination without abnormal findings: Secondary | ICD-10-CM

## 2022-04-23 MED ORDER — PANTOPRAZOLE SODIUM 40 MG PO TBEC: 40.0000 mg | DELAYED_RELEASE_TABLET | Freq: Every day | ORAL | 1 refills | Status: DC | PRN

## 2022-04-23 NOTE — Assessment & Plan Note (Addendum)
Patient has appromimately 2 year history of esophageal reflux. Reports acid reflux with bending over. He has been taking Protonix , but taking as needed. He travels frequently and most recent travel to [place]. No history of endoscopy. Up to date on colonoscopy.   Assessment/Plan: Chronic illness with exacerbation - Refilled Protonix. Hold this medication for 2 weeks. H.Pylori stool antigen test ordered, complete after holding Protonix. If negative will recommend further evaluation with endoscopy.

## 2022-04-23 NOTE — Progress Notes (Signed)
   HPI:Scott Roth is a 67 y.o. male who presents for evaluation of Follow-up (Wants to be checked for h pylori ) . For the details of today's visit, please refer to the assessment and plan.     Physical Exam: Vitals:   04/23/22 0835  BP: 122/83  Pulse: 64  Resp: 16  SpO2: 96%  Weight: 187 lb (84.8 kg)  Height: '5\' 6"'$  (1.676 m)     Physical Exam Constitutional:      Appearance: He is well-developed and well-groomed.  Eyes:     General: No scleral icterus.    Conjunctiva/sclera: Conjunctivae normal.  Cardiovascular:     Rate and Rhythm: Normal rate and regular rhythm.     Heart sounds: No murmur heard.    No friction rub. No gallop.  Pulmonary:     Effort: Pulmonary effort is normal.     Breath sounds: No wheezing, rhonchi or rales.  Musculoskeletal:     Right lower leg: No edema.     Left lower leg: No edema.  Skin:    General: Skin is warm and dry.      Assessment & Plan:   Esophageal reflux Patient has appromimately 2 year history of esophageal reflux. Reports acid reflux with bending over. He has been taking Protonix , but taking as needed. He travels frequently and most recent travel to [place]. No history of endoscopy. Up to date on colonoscopy.   Assessment/Plan: Chronic illness with exacerbation - Refilled Protonix. Hold this medication for 2 weeks. H.Pylori stool antigen test ordered, complete after holding Protonix. If negative will recommend further evaluation with endoscopy.     Lorene Dy, MD

## 2022-04-23 NOTE — Patient Instructions (Signed)
Health Maintenance, Male Adopting a healthy lifestyle and getting preventive care are important in promoting health and wellness. Ask your health care provider about: The right schedule for you to have regular tests and exams. Things you can do on your own to prevent diseases and keep yourself healthy. What should I know about diet, weight, and exercise? Eat a healthy diet  Eat a diet that includes plenty of vegetables, fruits, low-fat dairy products, and lean protein. Do not eat a lot of foods that are high in solid fats, added sugars, or sodium. Maintain a healthy weight Body mass index (BMI) is a measurement that can be used to identify possible weight problems. It estimates body fat based on height and weight. Your health care provider can help determine your BMI and help you achieve or maintain a healthy weight. Get regular exercise Get regular exercise. This is one of the most important things you can do for your health. Most adults should: Exercise for at least 150 minutes each week. The exercise should increase your heart rate and make you sweat (moderate-intensity exercise). Do strengthening exercises at least twice a week. This is in addition to the moderate-intensity exercise. Spend less time sitting. Even light physical activity can be beneficial. Watch cholesterol and blood lipids Have your blood tested for lipids and cholesterol at 67 years of age, then have this test every 5 years. You may need to have your cholesterol levels checked more often if: Your lipid or cholesterol levels are high. You are older than 67 years of age. You are at high risk for heart disease. What should I know about cancer screening? Many types of cancers can be detected early and may often be prevented. Depending on your health history and family history, you may need to have cancer screening at various ages. This may include screening for: Colorectal cancer. Prostate cancer. Skin cancer. Lung  cancer. What should I know about heart disease, diabetes, and high blood pressure? Blood pressure and heart disease High blood pressure causes heart disease and increases the risk of stroke. This is more likely to develop in people who have high blood pressure readings or are overweight. Talk with your health care provider about your target blood pressure readings. Have your blood pressure checked: Every 3-5 years if you are 18-39 years of age. Every year if you are 40 years old or older. If you are between the ages of 65 and 75 and are a current or former smoker, ask your health care provider if you should have a one-time screening for abdominal aortic aneurysm (AAA). Diabetes Have regular diabetes screenings. This checks your fasting blood sugar level. Have the screening done: Once every three years after age 45 if you are at a normal weight and have a low risk for diabetes. More often and at a younger age if you are overweight or have a high risk for diabetes. What should I know about preventing infection? Hepatitis B If you have a higher risk for hepatitis B, you should be screened for this virus. Talk with your health care provider to find out if you are at risk for hepatitis B infection. Hepatitis C Blood testing is recommended for: Everyone born from 1945 through 1965. Anyone with known risk factors for hepatitis C. Sexually transmitted infections (STIs) You should be screened each year for STIs, including gonorrhea and chlamydia, if: You are sexually active and are younger than 67 years of age. You are older than 67 years of age and your   health care provider tells you that you are at risk for this type of infection. Your sexual activity has changed since you were last screened, and you are at increased risk for chlamydia or gonorrhea. Ask your health care provider if you are at risk. Ask your health care provider about whether you are at high risk for HIV. Your health care provider  may recommend a prescription medicine to help prevent HIV infection. If you choose to take medicine to prevent HIV, you should first get tested for HIV. You should then be tested every 3 months for as long as you are taking the medicine. Follow these instructions at home: Alcohol use Do not drink alcohol if your health care provider tells you not to drink. If you drink alcohol: Limit how much you have to 0-2 drinks a day. Know how much alcohol is in your drink. In the [place] one drink equals one 12 oz bottle of beer (355 mL), one 5 oz glass of wine (148 mL), or one 1 oz glass of hard liquor (44 mL). Lifestyle Do not use any products that contain nicotine or tobacco. These products include cigarettes, chewing tobacco, and vaping devices, such as e-cigarettes. If you need help quitting, ask your health care provider. Do not use street drugs. Do not share needles. Ask your health care provider for help if you need support or information about quitting drugs. General instructions Schedule regular health, dental, and eye exams. Stay current with your vaccines. Tell your health care provider if: You often feel depressed. You have ever been abused or do not feel safe at home. Summary Adopting a healthy lifestyle and getting preventive care are important in promoting health and wellness. Follow your health care provider's instructions about healthy diet, exercising, and getting tested or screened for diseases. Follow your health care provider's instructions on monitoring your cholesterol and blood pressure. This information is not intended to replace advice given to you by your health care provider. Make sure you discuss any questions you have with your health care provider. Document Revised: 08/22/2020 Document Reviewed: 08/22/2020 Elsevier Patient Education  2023 Elsevier Inc.  

## 2022-04-23 NOTE — Progress Notes (Signed)
Subjective:   Scott Roth is a 67 y.o. male who presents for Medicare Annual/Subsequent preventive examination.  Review of Systems     Cardiac Risk Factors include: dyslipidemia;hypertension;male gender     Objective:    Today's Vitals   04/23/22 0904  BP: 122/83  Weight: 187 lb (84.8 kg)  Height: '5\' 6"'$  (1.676 m)   Body mass index is 30.18 kg/m.     06/01/2021   12:12 PM 04/04/2021   10:13 AM 11/10/2020    9:55 AM 09/18/2017    5:51 AM 07/24/2017    8:28 PM 12/23/2014    8:50 PM 11/04/2014    9:05 PM  Advanced Directives  Does Patient Have a Medical Advance Directive? No No;Yes No Yes No Yes Yes  Type of Wellsite geologist Power of Attorney  Living will Living will  Does patient want to make changes to medical advance directive?  Yes (Inpatient - patient defers changing a medical advance directive and declines information at this time)  No - Patient declined  No - Patient declined No - Patient declined  Copy of [place] in Chart?  No - copy requested  No - copy requested     Would patient like information on creating a medical advance directive?  Yes (Inpatient - patient defers creating a medical advance directive and declines information at this time) No - Patient declined  No - Patient declined      Current Medications (verified) Outpatient Encounter Medications as of 04/23/2022  Medication Sig   aspirin EC 81 MG tablet Take 1 tablet (81 mg total) by mouth daily. Swallow whole.   B Complex-C (SUPER B COMPLEX PO) Take 1 capsule by mouth daily.   Cholecalciferol (VITAMIN D3) 125 MCG (5000 UT) CAPS Take 10,000 Units by mouth daily.    Coenzyme Q10 (COQ10) 100 MG CAPS Take 100 mg by mouth daily.   colchicine 0.6 MG tablet Take 1 tablet (0.6 mg total) by mouth daily as needed (gout flare up).   Cyanocobalamin (B-12 PO) Take 1 tablet by mouth daily.    empagliflozin (JARDIANCE) 10 MG TABS tablet Take 1 tablet (10  mg total) by mouth daily before breakfast.   GARLIC PO Take 1 tablet by mouth daily.   meclizine (ANTIVERT) 25 MG tablet Take 1 tablet (25 mg total) by mouth 3 (three) times daily as needed for dizziness.   metoprolol succinate (TOPROL XL) 25 MG 24 hr tablet Take 0.5 tablets (12.5 mg total) by mouth daily.   Multiple Vitamins-Minerals (MULTIVITAMIN PO) Take 1 tablet by mouth daily.    nitroGLYCERIN (NITROSTAT) 0.4 MG SL tablet PLACE 1 TABLET UNDER THE TONGUE EVERY 5 (FIVE) MINUTES X 3 DOSES AS NEEDED FOR CHEST PAIN.   omeprazole (PRILOSEC) 20 MG capsule Take 1 capsule (20 mg total) by mouth daily.   pantoprazole (PROTONIX) 40 MG tablet Take 1 tablet (40 mg total) by mouth daily as needed.   rosuvastatin (CRESTOR) 10 MG tablet Take 1 tablet (10 mg total) by mouth daily.   sacubitril-valsartan (ENTRESTO) 97-103 MG Take 1 tablet by mouth 2 (two) times daily.   TURMERIC PO Take 500 mg by mouth daily.    vitamin C (ASCORBIC ACID) 500 MG tablet Take 500 mg by mouth daily.   VITAMIN E PO Take 400 mg by mouth daily.    [DISCONTINUED] pantoprazole (PROTONIX) 40 MG tablet Take 1 tablet (40 mg total) by mouth daily as needed.  Facility-Administered Encounter Medications as of 04/23/2022  Medication   betamethasone acetate-betamethasone sodium phosphate (CELESTONE) injection 9 mg    Allergies (verified) Penicillins   History: Past Medical History:  Diagnosis Date   Allergy    seasonal allergies   Arthritis    bilateral hands, knees, shoulders   CAD (coronary artery disease)    a. CABG x4 with LIMA to dLAD, free RIMA to OM1, SVG to D1, SVG to OM2 in 2006. b. PCI 2008 with notable ventricular fibrillation during cath) - DES to prox LAD/1st diagonal. c. Cath 07/25/17 demonstrated previously placed stents, patent LIMA-LAD, patent free RIMA-OM, occluded VG-diagonal and occluded Y graft-OM2, without significant change from prior, elevated LVEDP.   Cardiomyopathy ([place])    EF prev normal 2004, 2008 // EF  35% by cath in 07/2017 // Echo 8/19:  Mild conc LVH, EF 40-45, diff HK, Gr 1 DD, mild AI, mild MR, mod LAE, normal RVSF, mild RAE, mild TR   Chest pain with moderate risk of acute coronary syndrome 11/94/1740   Chronic systolic CHF (congestive heart failure) (HCC)    Erectile dysfunction    GERD (gastroesophageal reflux disease)    on meds   Hypercholesteremia    Hyperlipidemia    on meds   Hypertension    on meds   Sleep apnea    does not wear CPAP mask   Tubular adenoma    Past Surgical History:  Procedure Laterality Date   CARDIAC CATHETERIZATION     X 1 stent in 2008 due to heart valve collapse after CABG   COLONOSCOPY N/A 11/10/2020   Procedure: COLONOSCOPY;  Surgeon: Rogene Houston, MD;  [facility];  Service: Endoscopy;  Laterality: N/A;  10:00   CORONARY ARTERY BYPASS GRAFT  2008   x4 vessels   EYE SURGERY Bilateral    Lasik   KNEE ARTHROPLASTY Left    LEFT HEART CATH AND CORS/GRAFTS ANGIOGRAPHY N/A 07/25/2017   Procedure: LEFT HEART CATH AND CORS/GRAFTS ANGIOGRAPHY;  Surgeon: Lorretta Harp, MD;  Location: [facility];  Service: Cardiovascular;  Laterality: N/A;   POLYPECTOMY  11/10/2020   Procedure: POLYPECTOMY;  Surgeon: Rogene Houston, MD;  [facility];  Service: Endoscopy;;   SHOULDER SURGERY Right    TOTAL KNEE ARTHROPLASTY Right 08/25/2014   Procedure: RIGHT TOTAL KNEE ARTHROPLASTY;  Surgeon: Kathryne Hitch, MD;  Location: [facility];  Service: Orthopedics;  Laterality: Right;   Family History  Problem Relation Age of Onset   Heart failure Father    Heart disease Father    Hypertension Mother    Colon polyps Neg Hx    Colon cancer Neg Hx    Esophageal cancer Neg Hx    Rectal cancer Neg Hx    Stomach cancer Neg Hx    Social History   Socioeconomic History   Marital status: Married    Spouse name: Not on file   Number of children: Not on file   Years of education: Not on file   Highest education level: Not on file   Occupational History   Occupation: Farming    Comment: owned multiple businesses; owns a bar, Barrister's clerk  Tobacco Use   Smoking status: Never   Smokeless tobacco: Never  Vaping Use   Vaping Use: Never used  Substance and Sexual Activity   Alcohol use: Not Currently    Comment: 1-2 times per month   Drug use: No   Sexual activity: Yes  Partners: Female  Other Topics Concern   Not on file  Social History Narrative   Married for 2 years.Lives with wife and step daughter.Originally born [place].Retired,business entrepeuer-owns several companies.   Social Determinants of Health   Financial Resource Strain: Low Risk  (04/23/2022)   Overall Financial Resource Strain (CARDIA)    Difficulty of Paying Living Expenses: Not hard at all  Food Insecurity: No Food Insecurity (04/23/2022)   Hunger Vital Sign    Worried About Running Out of Food in the Last Year: Never true    Ran Out of Food in the Last Year: Never true  Transportation Needs: No Transportation Needs (04/23/2022)   PRAPARE - Hydrologist (Medical): No    Lack of Transportation (Non-Medical): No  Physical Activity: Sufficiently Active (04/23/2022)   Exercise Vital Sign    Days of Exercise per Week: 4 days    Minutes of Exercise per Session: 60 min  Stress: No Stress Concern Present (04/04/2021)   [place]    Feeling of Stress : Not at all  Social Connections: Moderately Isolated (04/04/2021)   Social Connection and Isolation Panel [NHANES]    Frequency of Communication with Friends and Family: More than three times a week    Frequency of Social Gatherings with Friends and Family: Twice a week    Attends Religious Services: Never    Marine scientist or Organizations: No    Attends Music therapist: Never    Marital Status: Married    Tobacco Counseling Counseling given: Not  Answered   Clinical Intake:  Pre-visit preparation completed: No  Pain : No/denies pain     Nutritional Status: BMI 25 -29 Overweight Diabetes: No  How often do you need to have someone help you when you read instructions, pamphlets, or other written materials from your doctor or pharmacy?: 1 - Never  Diabetic? no  Interpreter Needed?: No      Activities of Daily Living    04/23/2022    9:07 AM  In your present state of health, do you have any difficulty performing the following activities:  Hearing? 0  Vision? 0  Difficulty concentrating or making decisions? 0  Walking or climbing stairs? 0  Dressing or bathing? 0  Doing errands, shopping? 0  Preparing Food and eating ? N  Using the Toilet? N  In the past six months, have you accidently leaked urine? N  Do you have problems with loss of bowel control? N  Managing your Medications? N  Managing your Finances? N  Housekeeping or managing your Housekeeping? N    Patient Care Team: Alvira Monday, FNP as PCP - General (Family Medicine) Belva Crome, MD as PCP - Cardiology (Cardiology)  Indicate any recent Medical Services you may have received from other than [facility] providers in the past year (date may be approximate).     Assessment:   This is a routine wellness examination for [place].  Hearing/Vision screen No results found.  Dietary issues and exercise activities discussed: Current Exercise Habits: The patient has a physically strenuous job, but has no regular exercise apart from work.   Goals Addressed             This Visit's Progress    Patient Stated   On track    Start another business, and travel.      Depression Screen    04/23/2022    9:08  AM 04/23/2022    8:37 AM 10/25/2021    9:08 AM 10/25/2021    9:07 AM 04/04/2021   10:14 AM 04/04/2021   10:10 AM 02/01/2021   11:01 AM  PHQ 2/9 Scores  PHQ - 2 Score 0 0 0 0 0 0 0  PHQ- 9 Score       1    Fall Risk    04/23/2022    9:07 AM  04/23/2022    8:37 AM 10/25/2021    9:07 AM 04/04/2021   10:14 AM 02/01/2021   11:01 AM  Fall Risk   Falls in the past year? 0 0 0 0 0  Number falls in past yr: 0 0 0 0 0  Injury with Fall? 0 0 0 0 0  Risk for fall due to :   No Fall Risks No Fall Risks   Follow up   Falls evaluation completed Falls evaluation completed     FALL RISK PREVENTION PERTAINING TO THE HOME:  Any stairs in or around the home? Yes  If so, are there any without handrails? No  Home free of loose throw rugs in walkways, pet beds, electrical cords, etc? Yes  Adequate lighting in your home to reduce risk of falls? Yes   ASSISTIVE DEVICES UTILIZED TO PREVENT FALLS:  Life alert? No  Use of a cane, walker or w/c? No  Grab bars in the bathroom? No  Shower chair or bench in shower? No  Elevated toilet seat or a handicapped toilet? No   TIMED UP AND GO:  Was the test performed? Yes .  Length of time to ambulate 10 feet: 5 sec.   Gait steady and fast without use of assistive device  Cognitive Function:        04/23/2022    9:08 AM 04/04/2021   10:15 AM  6CIT Screen  What Year? 0 points 0 points  What month? 0 points 0 points  What time? 0 points 0 points  Count back from 20 0 points 0 points  Months in reverse 0 points 0 points  Repeat phrase 0 points 0 points  Total Score 0 points 0 points    Immunizations Immunization History  Administered Date(s) Administered   PFIZER(Purple Top)SARS-COV-2 Vaccination 07/02/2019, 07/16/2019, 07/21/2019, 08/06/2019   PNEUMOCOCCAL CONJUGATE-20 04/04/2021    TDAP status: Due, Education has been provided regarding the importance of this vaccine. Advised may receive this vaccine at local pharmacy or Health Dept. Aware to provide a copy of the vaccination record if obtained from local pharmacy or Health Dept. Verbalized acceptance and understanding.  Flu Vaccine status: Up to date  Pneumococcal vaccine status: Up to date  Covid-19 vaccine status: Completed  vaccines  Qualifies for Shingles Vaccine? No   Zostavax completed No   Shingrix Completed?: No.    Education has been provided regarding the importance of this vaccine. Patient has been advised to call insurance company to determine out of pocket expense if they have not yet received this vaccine. Advised may also receive vaccine at local pharmacy or Health Dept. Verbalized acceptance and understanding.  Screening Tests Health Maintenance  Topic Date Due   DTaP/Tdap/Td (1 - Tdap) Never done   Zoster Vaccines- Shingrix (1 of 2) Never done   COVID-19 Vaccine (5 - 2023-24 season) 12/15/2021   Medicare Annual Wellness (AWV)  04/24/2023   COLONOSCOPY (Pts 45-40yr Insurance coverage will need to be confirmed)  11/10/2025   Pneumonia Vaccine 67 Years old  Completed   Hepatitis  C Screening  Completed   HPV VACCINES  Aged Out   INFLUENZA VACCINE  Discontinued    Health Maintenance  Health Maintenance Due  Topic Date Due   DTaP/Tdap/Td (1 - Tdap) Never done   Zoster Vaccines- Shingrix (1 of 2) Never done   COVID-19 Vaccine (5 - 2023-24 season) 12/15/2021    Colorectal cancer screening: Type of screening: Colonoscopy. Completed yes. Repeat every 5 years  Lung Cancer Screening: (Low Dose CT Chest recommended if Age 66-80 years, 30 pack-year currently smoking OR have quit w/in 15years.) does not qualify.   Lung Cancer Screening Referral: no  Additional Screening:  Hepatitis C Screening: does qualify; Completed   Vision Screening: Recommended annual ophthalmology exams for early detection of glaucoma and other disorders of the eye. Is the patient up to date with their annual eye exam?  Yes  Who is the provider or what is the name of the office in which the patient attends annual eye exams? Just had eye exam and got new glasses If pt is not established with a provider, would they like to be referred to a provider to establish care? No .   Dental Screening: Recommended annual dental  exams for proper oral hygiene  Community Resource Referral / Chronic Care Management: CRR required this visit?  No   CCM required this visit?  No      Plan:     I have personally reviewed and noted the following in the patient's chart:   Medical and social history Use of alcohol, tobacco or illicit drugs  Current medications and supplements including opioid prescriptions. Patient is not currently taking opioid prescriptions. Functional ability and status Nutritional status Physical activity Advanced directives List of other physicians Hospitalizations, surgeries, and ER visits in previous 12 months Vitals Screenings to include cognitive, depression, and falls Referrals and appointments  In addition, I have reviewed and discussed with patient certain preventive protocols, quality metrics, and best practice recommendations. A written personalized care plan for preventive services as well as general preventive health recommendations were provided to patient.     Eual Fines, LPN   10/17/5327   Nurse Notes: Schedule your next wellness visit for 1 year at checkout

## 2022-04-23 NOTE — Patient Instructions (Signed)
Thank you for trusting me with your care. To recap, today we discussed the following:   Do not take Protonix for 2 weeks. Then complete stool test. I will follow up with results.

## 2022-04-24 ENCOUNTER — Ambulatory Visit: Payer: PRIVATE HEALTH INSURANCE | Attending: Nurse Practitioner

## 2022-04-24 DIAGNOSIS — I5022 Chronic systolic (congestive) heart failure: Secondary | ICD-10-CM

## 2022-04-24 DIAGNOSIS — E785 Hyperlipidemia, unspecified: Secondary | ICD-10-CM

## 2022-04-24 LAB — LIPID PANEL
Chol/HDL Ratio: 2.9 ratio (ref 0.0–5.0)
Cholesterol, Total: 132 mg/dL (ref 100–199)
HDL: 45 mg/dL (ref >39)
LDL Chol Calc (NIH): 70 mg/dL (ref 0–99)
Triglycerides: 88 mg/dL (ref 0–149)
VLDL Cholesterol Cal: 17 mg/dL (ref 5–40)

## 2022-04-24 LAB — HEPATIC FUNCTION PANEL
ALT: 18 IU/L (ref 0–44)
AST: 18 IU/L (ref 0–40)
Albumin: 4.3 g/dL (ref 3.9–4.9)
Alkaline Phosphatase: 74 IU/L (ref 44–121)
Bilirubin Total: 0.4 mg/dL (ref 0.0–1.2)
Bilirubin, Direct: 0.12 mg/dL (ref 0.00–0.40)
Total Protein: 6.9 g/dL (ref 6.0–8.5)

## 2022-04-25 ENCOUNTER — Other Ambulatory Visit: Payer: Self-pay

## 2022-04-25 NOTE — Progress Notes (Signed)
Discontinue of Jardiance; too expensive for patient.

## 2022-04-26 DIAGNOSIS — K219 Gastro-esophageal reflux disease without esophagitis: Secondary | ICD-10-CM | POA: Diagnosis not present

## 2022-04-29 LAB — H. PYLORI ANTIGEN, STOOL: H pylori Ag, Stl: POSITIVE — AB

## 2022-04-30 ENCOUNTER — Other Ambulatory Visit: Payer: Self-pay

## 2022-04-30 ENCOUNTER — Other Ambulatory Visit: Payer: Self-pay | Admitting: Internal Medicine

## 2022-04-30 DIAGNOSIS — A048 Other specified bacterial intestinal infections: Secondary | ICD-10-CM

## 2022-04-30 MED ORDER — BIS SUBCIT-METRONID-TETRACYC 140-125-125 MG PO CAPS: 3.0000 | ORAL_CAPSULE | Freq: Three times a day (TID) | ORAL | 0 refills | Status: DC

## 2022-05-01 DIAGNOSIS — R102 Pelvic and perineal pain: Secondary | ICD-10-CM | POA: Diagnosis not present

## 2022-06-08 ENCOUNTER — Telehealth: Payer: Self-pay

## 2022-06-08 NOTE — Patient Instructions (Signed)
Visit Information  Thank you for taking time to visit with me today. Please don't hesitate to contact me if I can be of assistance to you.   Following are the goals we discussed today:   Goals Addressed             This Visit's Progress    COMPLETED: Care Coordination Activities - no follow up required       Interventions Today    Flowsheet Row Most Recent Value  Chronic Disease   Chronic disease during today's visit Congestive Heart Failure (CHF)  General Interventions   General Interventions Discussed/Reviewed General Interventions Discussed, Doctor Visits  Doctor Visits Discussed/Reviewed Doctor Visits Discussed, Annual Wellness Visits  Education Interventions   Education Provided Provided Education  Provided Verbal Education On Other  [care coordination services]              If you are experiencing a Mental Health or Hope or need someone to talk to, please call the Suicide and Crisis Lifeline: 988 call the [place] National Suicide Prevention Lifeline: 331-565-2377 or TTY: 231-775-4679 TTY 2298750100) to talk to a trained counselor call 1-800-273-TALK (toll free, 24 hour hotline) go to [facility] [place] [phone]) call 911   Patient verbalizes understanding of instructions and care plan provided today and agrees to view in [place]. Active MyChart status and patient understanding of how to access instructions and care plan via MyChart confirmed with patient.     No further follow up required:   Peter Garter RN, Jackquline [place], [place] [facility] [phone]

## 2022-06-08 NOTE — Patient Outreach (Signed)
  Care Coordination   Initial Visit Note   06/08/2022 Name: ELI WISSE MRN: [id number] DOB: March 13, 1956  ARLEIGH GREENIA is a 67 y.o. year old male who sees Alvira Monday, [place] for primary care. I spoke with  [name] by phone today.  What matters to the patients health and wellness today?  No concerns today States he is not having any problems with his heart and goes back to see cardiology on 06/19/22    Goals Addressed             This Visit's Progress    COMPLETED: Care Coordination Activities - no follow up required       Interventions Today    Flowsheet Row Most Recent Value  Chronic Disease   Chronic disease during today's visit Congestive Heart Failure (CHF)  General Interventions   General Interventions Discussed/Reviewed General Interventions Discussed, Doctor Visits  Doctor Visits Discussed/Reviewed Doctor Visits Discussed, Annual Wellness Visits  Education Interventions   Education Provided Provided Education  Provided Verbal Education On Other  [care coordination services]              SDOH assessments and interventions completed:  Yes  SDOH Interventions Today    Flowsheet Row Most Recent Value  SDOH Interventions   Food Insecurity Interventions Intervention Not Indicated  Housing Interventions Intervention Not Indicated  Transportation Interventions Intervention Not Indicated  Utilities Interventions Intervention Not Indicated        Care Coordination Interventions:  Yes, provided   Follow up plan: No further intervention required.   Encounter Outcome:  Pt. Visit Completed  Peter Garter RN, BSN,CCM, CDE Care Management Coordinator [facility] [phone]

## 2022-06-14 ENCOUNTER — Encounter: Payer: Self-pay | Admitting: Radiology

## 2022-06-18 NOTE — Progress Notes (Unsigned)
Office Visit    Patient Name: Scott Roth Date of Encounter: 06/18/2022  Primary Care Provider:  Alvira Monday, [place] Primary Cardiologist:  Sinclair Grooms, MD (Inactive) Primary Electrophysiologist: None  Chief Complaint    Scott Roth is a 67 y.o. male with PMH of CAD (CABG x4 with LIMA to dLAD, free RIMA to OM1, SVG to D1, SVG to OM2 in 2006, PCI 2008 with notable ventricular fibrillation during cath), HTN, HLD, OSA not on CPAP, ED, arthritis who presents today for 41-monthfollow-up of dizziness and shortness of breath.   Past Medical History    Past Medical History:  Diagnosis Date   Allergy    seasonal allergies   Arthritis    bilateral hands, knees, shoulders   CAD (coronary artery disease)    a. CABG x4 with LIMA to dLAD, free RIMA to OM1, SVG to D1, SVG to OM2 in 2006. b. PCI 2008 with notable ventricular fibrillation during cath) - DES to prox LAD/1st diagonal. c. Cath 07/25/17 demonstrated previously placed stents, patent LIMA-LAD, patent free RIMA-OM, occluded VG-diagonal and occluded Y graft-OM2, without significant change from prior, elevated LVEDP.   Cardiomyopathy ([facility]    EF prev normal 2004, 2008 // EF 35% by cath in 07/2017 // Echo 8/19:  Mild conc LVH, EF 40-45, diff HK, Gr 1 DD, mild AI, mild Scott, mod LAE, normal RVSF, mild RAE, mild TR   Chest pain with moderate risk of acute coronary syndrome [id number]  Chronic systolic CHF (congestive heart failure) (HCC)    Erectile dysfunction    GERD (gastroesophageal reflux disease)    on meds   Hypercholesteremia    Hyperlipidemia    on meds   Hypertension    on meds   Sleep apnea    does not wear CPAP mask   Tubular adenoma    Past Surgical History:  Procedure Laterality Date   CARDIAC CATHETERIZATION     X 1 stent in 2008 due to heart valve collapse after CABG   COLONOSCOPY N/A 11/10/2020   Procedure: COLONOSCOPY;  Surgeon: RRogene Houston MD;  [facility];  Service: Endoscopy;   Laterality: N/A;  10:00   CORONARY ARTERY BYPASS GRAFT  2008   x4 vessels   EYE SURGERY Bilateral    Lasik   KNEE ARTHROPLASTY Left    LEFT HEART CATH AND CORS/GRAFTS ANGIOGRAPHY N/A 07/25/2017   Procedure: LEFT HEART CATH AND CORS/GRAFTS ANGIOGRAPHY;  Surgeon: BLorretta Harp MD;  [facility];  Service: Cardiovascular;  Laterality: N/A;   POLYPECTOMY  11/10/2020   Procedure: POLYPECTOMY;  Surgeon: RRogene Houston MD;  [facility];  Service: Endoscopy;;   SHOULDER SURGERY Right    TOTAL KNEE ARTHROPLASTY Right 08/25/2014   Procedure: RIGHT TOTAL KNEE ARTHROPLASTY;  Surgeon: DKathryne Hitch MD;  [facility]  Service: Orthopedics;  Laterality: Right;    Allergies  Allergies  Allergen Reactions   Penicillins Itching and Rash    Other reaction(s): Other (See Comments) Has patient had a PCN reaction causing immediate rash, facial/tongue/throat swelling, SOB or lightheadedness with hypotension: No Has patient had a PCN reaction causing severe rash involving mucus membranes or skin necrosis: No Has patient had a PCN reaction that required hospitalization: No Has patient had a PCN reaction occurring within the last 10 years: No If all of the above answers are "NO", then may proceed with Cephalosporin use.    History of Present Illness  Scott Roth  is a 67 year old male with the above mention past medical history who presents today for 59-monthfollow-up. Cardiac history dates back to 2006 when he underwent CABG x4 and had subsequent PCI and left heart cath in 2008.  2D echo was completed at that time with EF of 50%.  Patient did develop VF during cath procedure that required DCCV.  He underwent Lexiscan for surgical clearance of right TKA in 07/2017 which showed EF 33%, medium defect of severe severity present in the apical anterior, apical lateral and apex location consistent with prior infarction, no ischemia present.  He underwent LHC due to new LV  dysfunction that showed similar findings of 2008 cath with no clear cause of decreased LV function.  He was treated with medical therapy with plan to repeat 2D echo for assessment of ICD candidacy if no improvement. Last 2D echo was completed 2022 that showed EF of 45-50% with mildly decreased LV function and global hypokinesis with grade 3 DD, normal RV systolic function with mild to moderate AV regurgitation and borderline aortic dilation with 37 mm ascending aortic dilation noted.  He was seen last by Dr. STamala Julianon 03/2021 and had discontinued his beta-blocker therapy and this was resumed during visit.  The importance of medication compliance for survival of HFrEF was done at with recommendation to start SGLT2.  Patient remained reluctant to begin therapy at that time.  He was seen in follow-up 02/2022 and reported resolution of dizziness but endorsed shortness of breath with activity.  Blood pressure was well-controlled and patient was euvolemic on exam.  GDMT was optimized with addition of Jardiance and plan to add spironolactone at upcoming follow-up.  Scott Roth today for 334-monthollow-up.  Since last being seen in the office patient reports that he is doing well with no new cardiac complaints.  He is euvolemic on exam today.  He has been compliant with his current medication regimen and denies any adverse reactions.  His blood pressure today was well-controlled at 112/60 and heart rate was 81 bpm.  He is continuing to stay active and works out 4 days/week.  He is not required any as needed Lasix since our previous visit 3 months ago.  He follows a low-sodium heart healthy diet.  He was started on Jardiance at previous visit but had to discontinue due to cost.  We discussed other medications to add to his current GDMT.  He is very motivated and willing to try new medications at this time.  He requested establishment of care with Dr. PeJohney Frameue to Dr. SmThompson Cauletirement.  Patient denies chest  pain, palpitations, dyspnea, PND, orthopnea, nausea, vomiting, dizziness, syncope, edema, weight gain, or early satiety.  Home Medications    Current Outpatient Medications  Medication Sig Dispense Refill   aspirin EC 81 MG tablet Take 1 tablet (81 mg total) by mouth daily. Swallow whole.     B Complex-C (SUPER B COMPLEX PO) Take 1 capsule by mouth daily.     bismuth-metronidazole-tetracycline (PYLERA) 140-125-125 MG capsule Take 3 capsules by mouth 4 (four) times daily -  before meals and at bedtime for 10 days. 120 capsule 0   Cholecalciferol (VITAMIN D3) 125 MCG (5000 UT) CAPS Take 10,000 Units by mouth daily.      Coenzyme Q10 (COQ10) 100 MG CAPS Take 100 mg by mouth daily.     colchicine 0.6 MG tablet Take 1 tablet (0.6 mg total) by mouth daily as needed (gout flare  up). 30 tablet 1   Cyanocobalamin (B-12 PO) Take 1 tablet by mouth daily.      GARLIC PO Take 1 tablet by mouth daily.     meclizine (ANTIVERT) 25 MG tablet Take 1 tablet (25 mg total) by mouth 3 (three) times daily as needed for dizziness. 30 tablet 0   metoprolol succinate (TOPROL XL) 25 MG 24 hr tablet Take 0.5 tablets (12.5 mg total) by mouth daily. 45 tablet 3   Multiple Vitamins-Minerals (MULTIVITAMIN PO) Take 1 tablet by mouth daily.      nitroGLYCERIN (NITROSTAT) 0.4 MG SL tablet PLACE 1 TABLET UNDER THE TONGUE EVERY 5 (FIVE) MINUTES X 3 DOSES AS NEEDED FOR CHEST PAIN. 25 tablet 3   pantoprazole (PROTONIX) 40 MG tablet Take 1 tablet (40 mg total) by mouth daily as needed. 90 tablet 1   rosuvastatin (CRESTOR) 10 MG tablet Take 1 tablet (10 mg total) by mouth daily. 30 tablet 4   sacubitril-valsartan (ENTRESTO) 97-103 MG Take 1 tablet by mouth 2 (two) times daily. 180 tablet 3   TURMERIC PO Take 500 mg by mouth daily.      vitamin C (ASCORBIC ACID) 500 MG tablet Take 500 mg by mouth daily.     VITAMIN E PO Take 400 mg by mouth daily.      Current Facility-Administered Medications  Medication Dose Route Frequency  Provider Last Rate Last Admin   betamethasone acetate-betamethasone sodium phosphate (CELESTONE) injection 9 mg  9 mg Other Once Evelina Bucy, DPM         Review of Systems  Please see the history of present illness.     All other systems reviewed and are otherwise negative except as noted above.  Physical Exam    Wt Readings from Last 3 Encounters:  04/23/22 187 lb (84.8 kg)  04/23/22 187 lb (84.8 kg)  02/26/22 190 lb 12.8 oz (86.5 kg)   [id number] were no vitals filed for this visit.,There is no height or weight on file to calculate BMI.  Constitutional:      Appearance: Healthy appearance. Not in distress.  Neck:     Vascular: JVD normal.  Pulmonary:     Effort: Pulmonary effort is normal.     Breath sounds: No wheezing. No rales. Diminished in the bases Cardiovascular:     Normal rate. Regular rhythm. Normal S1. Normal S2.      Murmurs: There is no murmur.  Edema:    Peripheral edema absent.  Abdominal:     Palpations: Abdomen is soft non tender. There is no hepatomegaly.  Skin:    General: Skin is warm and dry.  Neurological:     General: No focal deficit present.     Mental Status: Alert and oriented to person, place and time.     Cranial Nerves: Cranial nerves are intact.  EKG/LABS/ Recent Cardiac Studies    ECG personally reviewed by me today -none completed today    Lab Results  Component Value Date   WBC 6.6 10/26/2021   HGB 15.1 10/26/2021   HCT 45.6 10/26/2021   MCV 93 10/26/2021   PLT 295 10/26/2021   Lab Results  Component Value Date   CREATININE 0.89 10/26/2021   BUN 16 10/26/2021   NA 142 10/26/2021   K 4.9 10/26/2021   CL 103 10/26/2021   CO2 21 10/26/2021   Lab Results  Component Value Date   ALT 18 04/24/2022   AST 18 04/24/2022   ALKPHOS 74 04/24/2022  BILITOT 0.4 04/24/2022   Lab Results  Component Value Date   CHOL 132 04/24/2022   HDL 45 04/24/2022   LDLCALC 70 04/24/2022   TRIG 88 04/24/2022   CHOLHDL 2.9 04/24/2022     Lab Results  Component Value Date   HGBA1C 6.3 (H) 10/26/2021    Cardiac Studies & Procedures   CARDIAC CATHETERIZATION  CARDIAC CATHETERIZATION 07/25/2017  Narrative Images from the original result were not included.   Prox Cx lesion is 100% stenosed.  Ost LAD to Prox LAD lesion is 50% stenosed.  Previously placed Ost 1st Diag stent (unknown type) is widely patent.  Previously placed Prox LAD stent (unknown type) is widely patent.  Prox LAD to Mid LAD lesion is 95% stenosed.  Origin to Prox Graft lesion is 100% stenosed.  There is moderate to severe left ventricular systolic dysfunction.  LV end diastolic pressure is moderately elevated.  The left ventricular ejection fraction is 35-45% by visual estimate.  Scott Roth is a 67 y.o. male   [id number] LOCATION:  FACILITY: [facility] PHYSICIAN: [name], M.D. 05-23-1955   DATE OF PROCEDURE:  07/25/2017  DATE OF DISCHARGE:     CARDIAC CATHETERIZATION    History obtained from chart review.Scott Roth is a 67 y.o. male with PMH of HTN, HLD, OSA not on CPAP and CAD s/p CABG x4 (LIMA to dLAD, free RIMA to OM1, SVG to D1, SVG to OM2) 06/16/2004 by Dr. Roxy Manns.  He had a history of PCI DES to prox LAD/1st diagonal in 2008. He had a patent LIMA to LAD, patent RIMA to OM1, occluded SVG to OM 2, and occluded SVG to D1. EF 50% at the time.  He did develop ventricular fibrillation during the cardiac catheterization requiring cardioversion.  The patient was seen by Truitt Merle on 07/09/2017 at which time he complained of intermittent shortness of breath for 1 month which occured with exertion and resolved quickly with rest. He underwent Myoview on 07/15/2017 which showed EF 33% with a medium defect of severe severity present in the apical anterior, apical lateral and apex location consistent with prior infarction, no ischemia present.  The report was reviewed by Dr. Tamala Julian. Given the new LV dysfunction, there was concern of  previous heart attack, therefore it was recommended for the patient to undergo cardiac catheterization.  Impression Scott Roth anatomy is similar to what it was back in 2008 when Dr. Tamala Julian performed LAD diagonal branch stenting. His LIMA to the LAD is patent. His free RIMA to the obtuse marginal branch is patent. His vein to diagonal branch is occluded. The Y graft to the second marginal graft is occluded as it was 11 years ago. His LV function is significantly reduced compared to prior assessments melena 35% range for unclear reasons. I have reviewed his films with Dr. Tamala Julian. Medical therapy will be pursued. A MYNX device was used to achieve hemostasis.  [name]. MD, [facility] 07/25/2017 8:51 AM  Findings Coronary Findings Diagnostic  Dominance: Right  Left Anterior Descending Ost LAD to Prox LAD lesion is 50% stenosed. Previously placed Prox LAD stent (unknown type) is widely patent. Prox LAD to Mid LAD lesion is 95% stenosed.  First Diagonal Branch Previously placed Ost 1st Diag stent (unknown type) is widely patent.  Left Circumflex Prox Cx lesion is 100% stenosed.  LIMA Graft To Mid LAD  Graft To 1st Diag Origin to Prox Graft lesion is 100% stenosed.  Graft To 2nd Mrg  Intervention  No interventions have been  documented.   STRESS TESTS  MYOCARDIAL PERFUSION IMAGING 07/15/2017  Narrative  Nuclear stress EF: 33%.  There was no ST segment deviation noted during stress.  Defect 1: There is a medium defect of severe severity present in the apical anterior, apical lateral and apex location.  Findings consistent with prior myocardial infarction.  This is a high risk study.  The left ventricular ejection fraction is moderately decreased (30-44%).  There is a medium size, irreversible defect in the apical anterior, lateral walls and in the true apex consistent with prior infarct. The study is a high risk given severely decreased LVEF of 33%, but no ischemia is  present.   ECHOCARDIOGRAM  ECHOCARDIOGRAM COMPLETE 06/03/2020  Narrative ECHOCARDIOGRAM REPORT    Patient Name:   Scott Roth Date of Exam: 06/03/2020 Medical Rec #:  [id number]          Height:       65.5 in Accession #:    [id number]         Weight:       194.0 lb Date of Birth:  1955-06-21          BSA:          1.964 m Patient Age:    89 years           BP:           117/70 mmHg Patient Gender: M                  HR:           70 bpm. Exam Location:  [address]  Procedure: 2D Echo, 3D Echo, Cardiac Doppler, Color Doppler, Strain Analysis and Intracardiac Opacification Agent  Indications:    I25.810 CAD  History:        Patient has prior history of Echocardiogram examinations, most recent 12/06/2017. Cardiomyopathy and CHF, CAD; Risk Factors:Hypertension, Sleep Apnea and Dyslipidemia.  Sonographer:    Jessee Avers, RDCS Referring Phys: [phone] Alphonsa Overall MCDANIEL   Sonographer Comments: Technically difficult study due to poor echo windows and suboptimal apical window. IMPRESSIONS   1. Left ventricular ejection fraction, by estimation, is 45 to 50%. Left ventricular ejection fraction by 3D volume is 50 %. The left ventricle has mildly decreased function. The left ventricle demonstrates global hypokinesis. Left ventricular diastolic parameters are consistent with Grade III diastolic dysfunction (restrictive). 2. Right ventricular systolic function is low normal. The right ventricular size is normal. There is normal pulmonary artery systolic pressure. The estimated right ventricular systolic pressure is [id number] mmHg. 3. The mitral valve is normal in structure. Mild mitral valve regurgitation. 4. The aortic valve is tricuspid. Aortic valve regurgitation is mild to moderate. No aortic stenosis is present. 5. Aortic dilatation noted. There is borderline dilatation of the aortic root, measuring 39 mm. There is borderline dilatation of the ascending aorta, measuring 37  mm.  Comparison(s): A prior study was performed on 12/06/2017. COmpared to prior, slight increase in ventricular function, aortic regurgitation has slightly increased with similar aortic size.  FINDINGS Left Ventricle: Left ventricular ejection fraction, by estimation, is 45 to 50%. Left ventricular ejection fraction by 3D volume is 50 %. The left ventricle has mildly decreased function. The left ventricle demonstrates global hypokinesis. Definity contrast agent was given IV to delineate the left ventricular endocardial borders. The left ventricular internal cavity size was normal in size. There is no left ventricular hypertrophy. Left ventricular diastolic parameters are consistent with Grade III diastolic dysfunction (  restrictive).  Right Ventricle: The right ventricular size is normal. No increase in right ventricular wall thickness. Right ventricular systolic function is low normal. There is normal pulmonary artery systolic pressure. The tricuspid regurgitant velocity is 2.52 m/s, and with an assumed right atrial pressure of 3 mmHg, the estimated right ventricular systolic pressure is [id number] mmHg.  Left Atrium: Left atrial size was normal in size.  Right Atrium: Right atrial size was normal in size.  Pericardium: There is no evidence of pericardial effusion.  Mitral Valve: Regurgitation mechanism appears to be posterior leaflet restriction. The mitral valve is normal in structure. Mild mitral valve regurgitation.  Tricuspid Valve: The tricuspid valve is grossly normal. Tricuspid valve regurgitation is mild . No evidence of tricuspid stenosis.  Aortic Valve: The aortic valve is tricuspid. Aortic valve regurgitation is mild to moderate. Aortic regurgitation PHT measures 437 msec. No aortic stenosis is present.  Pulmonic Valve: The pulmonic valve was grossly normal. Pulmonic valve regurgitation is not visualized. No evidence of pulmonic stenosis.  Aorta: Aortic dilatation noted. There is  borderline dilatation of the aortic root, measuring 39 mm. There is borderline dilatation of the ascending aorta, measuring 37 mm.  IAS/Shunts: The atrial septum is grossly normal.   LEFT VENTRICLE PLAX 2D LVIDd:         5.10 cm         Diastology LVIDs:         4.10 cm         LV e' medial:    5.19 cm/s LV PW:         0.90 cm         LV E/e' medial:  14.9 LV IVS:        0.90 cm         LV e' lateral:   7.62 cm/s LVOT diam:     2.00 cm         LV E/e' lateral: 10.2 LV SV:         51 LV SV Index:   26              2D LVOT Area:     3.14 cm        Longitudinal Strain 2D Strain GLS  -15.5 % (A2C): 2D Strain GLS  -16.6 % (A3C): 2D Strain GLS  -14.6 % (A4C): 2D Strain GLS  -15.6 % Avg:  3D Volume EF LV 3D EF:    Left ventricular ejection fraction by 3D volume is 50 %.  3D Volume EF: 3D EF:        50 % LV EDV:       159 ml LV ESV:       79 ml LV SV:        80 ml  RIGHT VENTRICLE RV Basal diam:  3.70 cm RV S prime:     6.13 cm/s TAPSE (M-mode): 1.2 cm RVSP:           28.4 mmHg  LEFT ATRIUM             Index       RIGHT ATRIUM           Index LA diam:        4.40 cm 2.24 cm/m  RA Pressure: 3.00 mmHg LA Vol (A2C):   35.5 ml 18.08 ml/m RA Area:     15.60 cm LA Vol (A4C):   57.9 ml 29.48 ml/m RA Volume:   44.50 ml  22.66 ml/m LA  Biplane Vol: 47.1 ml 23.98 ml/m AORTIC VALVE LVOT Vmax:   78.30 cm/s LVOT Vmean:  48.900 cm/s LVOT VTI:    0.161 m AI PHT:      437 msec  AORTA Ao Root diam: 3.90 cm Ao Asc diam:  3.70 cm  MITRAL VALVE               TRICUSPID VALVE TR Peak grad:   25.4 mmHg TR Vmax:        252.00 cm/s MV E velocity: 77.40 cm/s  Estimated RAP:  3.00 mmHg MV A velocity: 31.80 cm/s  RVSP:           28.4 mmHg MV E/A ratio:  2.43 SHUNTS Systemic VTI:  0.16 m Systemic Diam: 2.00 cm  Rudean Haskell MD Electronically signed by Rudean Haskell MD Signature Date/Time: 06/03/2020/3:40:20 PM    Final             Assessment & Plan     1.  HFrEF/ICM: -Last 2D echo was completed 2022 that showed EF of 45-50% with mildly decreased LV function and global hypokinesis with grade 3 DD -We will recheck 2D echo to evaluate LV function -He was unable to afford Jardiance and we will start him on spironolactone 12.5 mg daily -BMET in 2 weeks -Continue current GDMT with Toprol XL 12.5 mg, Entresto 97/103 mg, spironolactone 12.5 mg -Low sodium diet, fluid restriction <2L, and daily weights encouraged. Educated to contact our office for weight gain of 2 lbs overnight or 5 lbs in one week.    2.  Coronary artery disease: -s/p CABG x4 in 2006 with subsequent PCI and 2008.  LHC was performed 2019 that showed no change from 2008 cath but decreased LV function -Today patient reports no angina or chest pain -Continue current GDMT with ASA 81 mg, and Toprol 12.5 mg, Crestor 5 mg   3.  Hyperlipidemia: -Patient's last LDL was at goal of 27 -He endorses myalgia with his current dose of 20 mg of Crestor.  -Continue current dose of Crestor 5 mg daily4.    4. Essential hypertension: -Blood pressure today was well-controlled at 112/60 -Continue Toprol-XL 6.25 mg daily  Disposition: Follow-up with Sinclair Grooms, MD (Inactive) or APP in 6-8 months    Medication Adjustments/Labs and Tests Ordered: Current medicines are reviewed at length with the patient today.  Concerns regarding medicines are outlined above.   Signed, Mable Fill, Marissa Nestle, NP 06/18/2022, 11:27 AM [facility]  Note:  This document was prepared using Dragon voice recognition software and may include unintentional dictation errors.

## 2022-06-19 ENCOUNTER — Ambulatory Visit: Payer: Medicare Other | Attending: Nurse Practitioner | Admitting: Nurse Practitioner

## 2022-06-19 ENCOUNTER — Encounter: Payer: Self-pay | Admitting: Nurse Practitioner

## 2022-06-19 VITALS — HR 81 | Ht 67.0 in | Wt 191.0 lb

## 2022-06-19 DIAGNOSIS — I2510 Atherosclerotic heart disease of native coronary artery without angina pectoris: Secondary | ICD-10-CM

## 2022-06-19 DIAGNOSIS — E782 Mixed hyperlipidemia: Secondary | ICD-10-CM | POA: Diagnosis not present

## 2022-06-19 DIAGNOSIS — I255 Ischemic cardiomyopathy: Secondary | ICD-10-CM | POA: Diagnosis not present

## 2022-06-19 DIAGNOSIS — I5022 Chronic systolic (congestive) heart failure: Secondary | ICD-10-CM

## 2022-06-19 MED ORDER — SPIRONOLACTONE 25 MG PO TABS: 12.5000 mg | ORAL_TABLET | Freq: Every day | ORAL | 1 refills | Status: DC

## 2022-06-19 NOTE — Patient Instructions (Signed)
Medication Instructions:  START Spironolactone 12.'5mg'$  Take 1 tablet once a day  *If you need a refill on your cardiac medications before your next appointment, please call your pharmacy*   Lab Work: 2 WEEKS BMET If you have labs (blood work) drawn today and your tests are completely normal, you will receive your results only by: [place] (if you have MyChart) OR A paper copy in the mail If you have any lab test that is abnormal or we need to change your treatment, we will call you to review the results.   Testing/Procedures: Your physician has requested that you have an echocardiogram. Echocardiography is a painless test that uses sound waves to create images of your heart. It provides your doctor with information about the size and shape of your heart and how well your heart's chambers and valves are working. This procedure takes approximately one hour. There are no restrictions for this procedure. Please do NOT wear cologne, perfume, aftershave, or lotions (deodorant is allowed). Please arrive 15 minutes prior to your appointment time.   Follow-Up: At [facility], you and your health needs are our priority.  As part of our continuing mission to provide you with exceptional heart care, we have created designated Provider Care Teams.  These Care Teams include your primary Cardiologist (physician) and Advanced Practice Providers (APPs -  Physician Assistants and Nurse Practitioners) who all work together to provide you with the care you need, when you need it.  We recommend signing up for the patient portal called "MyChart".  Sign up information is provided on this After Visit Summary.  MyChart is used to connect with patients for Virtual Visits (Telemedicine).  Patients are able to view lab/test results, encounter notes, upcoming appointments, etc.  Non-urgent messages can be sent to your provider as well.   To learn more about what you can do with MyChart, go to  [email or web address].    Your next appointment:   6-8 month(s)  Provider:   Freada Bergeron, MD   Other Instructions

## 2022-06-21 ENCOUNTER — Other Ambulatory Visit: Payer: Self-pay | Admitting: Nurse Practitioner

## 2022-06-26 ENCOUNTER — Ambulatory Visit (INDEPENDENT_AMBULATORY_CARE_PROVIDER_SITE_OTHER): Payer: Medicare Other | Admitting: Internal Medicine

## 2022-06-26 ENCOUNTER — Encounter: Payer: Self-pay | Admitting: Internal Medicine

## 2022-06-26 VITALS — BP 127/85 | HR 83 | Temp 97.8°F | Resp 15 | Ht 66.0 in | Wt 192.1 lb

## 2022-06-26 DIAGNOSIS — R051 Acute cough: Secondary | ICD-10-CM

## 2022-06-26 DIAGNOSIS — A048 Other specified bacterial intestinal infections: Secondary | ICD-10-CM | POA: Diagnosis not present

## 2022-06-26 NOTE — Patient Instructions (Signed)
Thank you for trusting me with your care. To recap, today we discussed the following:   I will follow up with results of your viral testing and H.Pylori test.  You can use dextromethorphan ( Robitussin) for cough.  You can use guaifenesin ( Mucinex) for congestion.

## 2022-06-26 NOTE — Progress Notes (Unsigned)
   HPI:[name] is a 67 y.o. male who presents for evaluation of acute cough. He woke up with sore throat on Sunday.Tuesday he completely lost his voice. Has been coughing with some green mucus and sinus drainage. Also has body aches and felt a little dizzy . Had been cleaning out in garage Sat and was around mouse waste. Wife having same symptoms. In addition he is still having feeling of bloating after treatment of H.Pylori. He is here for test of cure.   Physical Exam: Vitals:   06/26/22 1602  BP: 127/85  Pulse: 83  Resp: 15  Temp: 97.8 F (36.6 C)  TempSrc: Oral  SpO2: 96%  Weight: 192 lb 1.9 oz (87.1 kg)  Height: 5\' 6"  (1.676 m)     Physical Exam Constitutional:      Appearance: He is ill-appearing. He is not toxic-appearing.  HENT:     Nose: Congestion and rhinorrhea present.     Mouth/Throat:     Mouth: Mucous membranes are moist.     Pharynx: No oropharyngeal exudate.  Cardiovascular:     Rate and Rhythm: Normal rate and regular rhythm.     Heart sounds: No murmur heard. Pulmonary:     Effort: No respiratory distress.     Breath sounds: No wheezing.      Assessment & Plan:   [name] was seen today for sore throat.  H. pylori infection Assessment & Plan: Patient took Pylera for 14 days. He is here for test of cure.  H. pylori stool antigen  Orders: -     H. pylori antigen, stool  Acute cough Assessment & Plan: Test for Covid , RSV, and Flu Treat congestion with Mucinex Treat cough with Dextromethorphan Follow up if symptoms worsen or fail to improve   Orders: -     COVID-19, Flu A+B and RSV      Lorene Dy, MD

## 2022-06-28 DIAGNOSIS — A048 Other specified bacterial intestinal infections: Secondary | ICD-10-CM | POA: Insufficient documentation

## 2022-06-28 DIAGNOSIS — R051 Acute cough: Secondary | ICD-10-CM | POA: Insufficient documentation

## 2022-06-28 LAB — COVID-19, FLU A+B AND RSV
Influenza A, NAA: NOT DETECTED
Influenza B, NAA: NOT DETECTED
RSV, NAA: NOT DETECTED
SARS-CoV-2, NAA: NOT DETECTED

## 2022-06-28 NOTE — Assessment & Plan Note (Addendum)
Patient took Pylera for 14 days. He is here for test of cure.  H. pylori stool antigen

## 2022-06-28 NOTE — Assessment & Plan Note (Signed)
Test for Covid , RSV, and Flu Treat congestion with Mucinex Treat cough with Dextromethorphan Follow up if symptoms worsen or fail to improve

## 2022-06-29 DIAGNOSIS — A048 Other specified bacterial intestinal infections: Secondary | ICD-10-CM | POA: Diagnosis not present

## 2022-07-01 LAB — H. PYLORI ANTIGEN, STOOL: H pylori Ag, Stl: NEGATIVE

## 2022-07-13 ENCOUNTER — Other Ambulatory Visit: Payer: Self-pay

## 2022-07-13 MED ORDER — METOPROLOL SUCCINATE ER 25 MG PO TB24: 12.5000 mg | ORAL_TABLET | Freq: Every day | ORAL | 1 refills | Status: DC

## 2022-07-18 ENCOUNTER — Other Ambulatory Visit (HOSPITAL_COMMUNITY): Payer: PRIVATE HEALTH INSURANCE

## 2022-07-23 ENCOUNTER — Ambulatory Visit: Payer: PRIVATE HEALTH INSURANCE | Admitting: Family Medicine

## 2022-07-23 ENCOUNTER — Telehealth: Payer: Self-pay | Admitting: Family Medicine

## 2022-07-23 NOTE — Telephone Encounter (Signed)
That fine.

## 2022-07-23 NOTE — Telephone Encounter (Signed)
Pt called in to cancel appt wants to change providers  to [facility].

## 2022-07-23 NOTE — Telephone Encounter (Signed)
Noted forwarding to provider.

## 2022-07-27 ENCOUNTER — Ambulatory Visit (INDEPENDENT_AMBULATORY_CARE_PROVIDER_SITE_OTHER): Payer: Medicare Other | Admitting: Internal Medicine

## 2022-07-27 ENCOUNTER — Encounter: Payer: Self-pay | Admitting: Internal Medicine

## 2022-07-27 VITALS — BP 124/79 | HR 71 | Ht 66.0 in | Wt 191.2 lb

## 2022-07-27 DIAGNOSIS — K219 Gastro-esophageal reflux disease without esophagitis: Secondary | ICD-10-CM

## 2022-07-27 DIAGNOSIS — I5022 Chronic systolic (congestive) heart failure: Secondary | ICD-10-CM | POA: Diagnosis not present

## 2022-07-27 DIAGNOSIS — E782 Mixed hyperlipidemia: Secondary | ICD-10-CM

## 2022-07-27 DIAGNOSIS — G4730 Sleep apnea, unspecified: Secondary | ICD-10-CM

## 2022-07-27 DIAGNOSIS — I2510 Atherosclerotic heart disease of native coronary artery without angina pectoris: Secondary | ICD-10-CM

## 2022-07-27 DIAGNOSIS — I10 Essential (primary) hypertension: Secondary | ICD-10-CM

## 2022-07-27 DIAGNOSIS — R1032 Left lower quadrant pain: Secondary | ICD-10-CM | POA: Diagnosis not present

## 2022-07-27 MED ORDER — PANTOPRAZOLE SODIUM 40 MG PO TBEC: 40.0000 mg | DELAYED_RELEASE_TABLET | Freq: Every day | ORAL | 1 refills | Status: DC

## 2022-07-27 MED ORDER — SPIRONOLACTONE 25 MG PO TABS: 12.5000 mg | ORAL_TABLET | Freq: Every day | ORAL | 1 refills | Status: DC

## 2022-07-27 MED ORDER — ROSUVASTATIN CALCIUM 5 MG PO TABS: 5.0000 mg | ORAL_TABLET | Freq: Every day | ORAL | 3 refills | Status: DC

## 2022-07-27 NOTE — Patient Instructions (Signed)
It was a pleasure to see you today.  Thank you for giving [place] the opportunity to be involved in your care.  Below is a brief recap of your visit and next steps.  We will plan to see you again in 3 months.  Summary Switch to rosuvastatin 5 mg daily Try taking pantoprazole 40 mg daily to see if this relieves your symptoms  Spironolactone should be waiting at your pharmacy Follow up in 3 months

## 2022-07-27 NOTE — Progress Notes (Unsigned)
Established Patient Office Visit  Subjective   Patient ID: Scott Roth, male    DOB: [birth year]  Age: 67 y.o. MRN: [id number]  Chief Complaint  Patient presents with   Hypertension    Follow up   Mr. Hollabaugh returns to care today for follow-up.  Last evaluated at [facility] on 3/12 by Dr. Barbaraann Faster for test of cure in the setting of H. pylori infection as well as endorsing upper respiratory symptoms.  There have been no acute interval events. Mr. Outten acute concerns today are recent headaches.  He has had 4 headaches over the last 3 weeks.  Symptoms resolved with Tylenol.  He additionally endorses occasional dizziness as well as persistent reflux symptoms.  He expresses a general dissatisfaction with his care, noting that he has been seen by several providers since establishing care at [facility].  He also states that medication changes have been made without him being aware, although when further discussing this it seems that these changes were made by his cardiologist.  Past Medical History:  Diagnosis Date   Allergy    seasonal allergies   Arthritis    bilateral hands, knees, shoulders   CAD (coronary artery disease)    a. CABG x4 with LIMA to dLAD, free RIMA to OM1, SVG to D1, SVG to OM2 in 2006. b. PCI 2008 with notable ventricular fibrillation during cath) - DES to prox LAD/1st diagonal. c. Cath 07/25/17 demonstrated previously placed stents, patent LIMA-LAD, patent free RIMA-OM, occluded VG-diagonal and occluded Y graft-OM2, without significant change from prior, elevated LVEDP.   Cardiomyopathy    EF prev normal 2004, 2008 // EF 35% by cath in 07/2017 // Echo 8/19:  Mild conc LVH, EF 40-45, diff HK, Gr 1 DD, mild AI, mild MR, mod LAE, normal RVSF, mild RAE, mild TR   Chest pain with moderate risk of acute coronary syndrome 07/24/2017   Chronic systolic CHF (congestive heart failure)    Erectile dysfunction    GERD (gastroesophageal reflux disease)    on meds   Hypercholesteremia     Hyperlipidemia    on meds   Hypertension    on meds   Sleep apnea    does not wear CPAP mask   Tubular adenoma    Past Surgical History:  Procedure Laterality Date   CARDIAC CATHETERIZATION     X 1 stent in 2008 due to heart valve collapse after CABG   COLONOSCOPY N/A 11/10/2020   Procedure: COLONOSCOPY;  Surgeon: Malissa Hippo, MD;  Location: [facility];  Service: Endoscopy;  Laterality: N/A;  10:00   CORONARY ARTERY BYPASS GRAFT  2008   x4 vessels   EYE SURGERY Bilateral    Lasik   KNEE ARTHROPLASTY Left    LEFT HEART CATH AND CORS/GRAFTS ANGIOGRAPHY N/A 07/25/2017   Procedure: LEFT HEART CATH AND CORS/GRAFTS ANGIOGRAPHY;  Surgeon: Runell Gess, MD;  Location: [facility] INVASIVE CV LAB;  Service: Cardiovascular;  Laterality: N/A;   POLYPECTOMY  11/10/2020   Procedure: POLYPECTOMY;  Surgeon: Malissa Hippo, MD;  Location: [facility];  Service: Endoscopy;;   SHOULDER SURGERY Right    TOTAL KNEE ARTHROPLASTY Right 08/25/2014   Procedure: RIGHT TOTAL KNEE ARTHROPLASTY;  Surgeon: Mckinley Jewel, MD;  Location: [facility] OR;  Service: Orthopedics;  Laterality: Right;   Social History   Tobacco Use   Smoking status: Never   Smokeless tobacco: Never  Vaping Use   Vaping Use: Never used  Substance Use Topics   Alcohol  use: Not Currently    Comment: 1-2 times per month   Drug use: No   Family History  Problem Relation Age of Onset   Heart failure Father    Heart disease Father    Hypertension Mother    Colon polyps Neg Hx    Colon cancer Neg Hx    Esophageal cancer Neg Hx    Rectal cancer Neg Hx    Stomach cancer Neg Hx    Allergies  Allergen Reactions   Penicillins Itching and Rash    Other reaction(s): Other (See Comments) Has patient had a PCN reaction causing immediate rash, facial/tongue/throat swelling, SOB or lightheadedness with hypotension: No Has patient had a PCN reaction causing severe rash involving mucus membranes or skin necrosis: No Has patient had a  PCN reaction that required hospitalization: No Has patient had a PCN reaction occurring within the last 10 years: No If all of the above answers are "NO", then may proceed with Cephalosporin use.   Review of Systems  Gastrointestinal:  Positive for heartburn.  Neurological:  Positive for dizziness and headaches.  All other systems reviewed and are negative.    Objective:     BP 124/79   Pulse 71   Ht  (1.676 m)   Wt 191 lb 3.2 oz (86.7 kg)   SpO2 93%   BMI 30.86 kg/m  BP Readings from Last 3 Encounters:  07/27/22 124/79  06/26/22 127/85  04/23/22 122/83   Physical Exam Vitals reviewed.  Constitutional:      General: He is not in acute distress.    Appearance: Normal appearance. He is obese. He is not ill-appearing.  HENT:     Head: Normocephalic and atraumatic.     Right Ear: External ear normal.     Left Ear: External ear normal.     Nose: Nose normal. No congestion or rhinorrhea.     Mouth/Throat:     Mouth: Mucous membranes are moist.     Pharynx: Oropharynx is clear.  Eyes:     General: No scleral icterus.    Extraocular Movements: Extraocular movements intact.     Conjunctiva/sclera: Conjunctivae normal.     Pupils: Pupils are equal, round, and reactive to light.  Cardiovascular:     Rate and Rhythm: Normal rate and regular rhythm.     Pulses: Normal pulses.     Heart sounds: Normal heart sounds. No murmur heard. Pulmonary:     Effort: Pulmonary effort is normal.     Breath sounds: Normal breath sounds. No wheezing, rhonchi or rales.  Abdominal:     General: Abdomen is flat. Bowel sounds are normal. There is no distension.     Palpations: Abdomen is soft.     Tenderness: There is no abdominal tenderness.  Musculoskeletal:        General: No swelling or deformity. Normal range of motion.     Cervical back: Normal range of motion.  Skin:    General: Skin is warm and dry.     Capillary Refill: Capillary refill takes less than 2 seconds.   Neurological:     General: No focal deficit present.     Mental Status: He is alert and oriented to person, place, and time.     Motor: No weakness.  Psychiatric:        Mood and Affect: Mood normal.        Behavior: Behavior normal.        Thought Content: Thought content normal.  Last CBC Lab Results  Component Value Date   WBC 6.6 10/26/2021   HGB 15.1 10/26/2021   HCT 45.6 10/26/2021   MCV 93 10/26/2021   MCH 30.9 10/26/2021   RDW 13.1 10/26/2021   PLT 295 10/26/2021   Last metabolic panel Lab Results  Component Value Date   GLUCOSE 105 (H) 10/26/2021   NA 142 10/26/2021   K 4.9 10/26/2021   CL 103 10/26/2021   CO2 21 10/26/2021   BUN 16 10/26/2021   CREATININE 0.89 10/26/2021   EGFR 95 10/26/2021   CALCIUM 9.5 10/26/2021   PROT 6.9 04/24/2022   ALBUMIN 4.3 04/24/2022   LABGLOB 2.6 10/26/2021   AGRATIO 1.6 10/26/2021   BILITOT 0.4 04/24/2022   ALKPHOS 74 04/24/2022   AST 18 04/24/2022   ALT 18 04/24/2022   ANIONGAP 10 06/01/2021   Last lipids Lab Results  Component Value Date   CHOL 132 04/24/2022   HDL 45 04/24/2022   LDLCALC 70 04/24/2022   TRIG 88 04/24/2022   CHOLHDL 2.9 04/24/2022   Last hemoglobin A1c Lab Results  Component Value Date   HGBA1C 6.3 (H) 10/26/2021   Last thyroid functions Lab Results  Component Value Date   TSH 2.500 10/26/2021   T4TOTAL 7.3 06/30/2019   Last vitamin D Lab Results  Component Value Date   VD25OH 25.6 (L) 10/26/2021   Last vitamin B12 and Folate Lab Results  Component Value Date   VITAMINB12 1,877 (H) 06/06/2020   The 10-year ASCVD risk score (Arnett DK, et al., 2019) is: 24.1%    Assessment & Plan:   Problem List Items Addressed This Visit       Hypertension    BP 124/79 today.  He is currently prescribed Entresto 97-103 mg twice daily, metoprolol succinate 12.5 mg daily, and spironolactone 12.5 mg daily. -No medication changes today      CAD (coronary artery disease)    History of CAD  s/p CABG x 4 in 2008.  He is followed by cardiology.  Last seen for follow-up in March.  Denies recent chest pain.  Currently prescribed ASA, metoprolol, and rosuvastatin. -No medication changes today      Chronic systolic CHF (congestive heart failure)    EF 45-50% on most recent echo from August 2022.  Euvolemic on exam today.  Spironolactone was recently added to his medication regimen, but he was unaware of this change.  He is additionally prescribed metoprolol and Entresto. -No medication changes today      Sleep apnea    Previously documented history of confirmed OSA.  Has never worn CPAP but has a dental device.      Esophageal reflux    He endorses persistent reflux symptoms today.  Recent history of H. pylori that was successfully treated.  He is currently taking Protonix as needed for symptom relief. -Recommended taking Protonix 40 mg daily instead of on an as-needed basis. -He will notify us if his symptoms worsen or fail to improve      Hyperlipidemia - Primary    Lipid panel last updated in January.  Total cholesterol 132 and LDL 70.  He is currently prescribed rosuvastatin 5 mg daily.  Expresses frustration because he has to cut a 10 mg tablet in half. -New Rx for rosuvastatin 5 mg tablet written today      Left groin pain    His acute concern today is pain in his left groin.  He states that this is exacerbated with flexion at  the waist such as when attempting to do sit ups.  He is concerned that he has a hernia, but no inguinal hernia is detected on exam today.  No change in bowel habits.  Symptoms are not exacerbated by food intake.  Appears that this is a chronic issue, previously documented in August 2022.  Scrotal U/S ordered at that time but never completed. -CT abdomen pelvis ordered today for further evaluation      Return in about 3 months (around 10/26/2022).   Billie Lade, MD

## 2022-08-01 ENCOUNTER — Encounter: Payer: Self-pay | Admitting: Internal Medicine

## 2022-08-01 NOTE — Assessment & Plan Note (Signed)
His acute concern today is pain in his left groin.  He states that this is exacerbated with flexion at the waist such as when attempting to do sit ups.  He is concerned that he has a hernia, but no inguinal hernia is detected on exam today.  Appears that this is a chronic issue, previously documented in August 2022.  Scrotal U/S ordered at that time but never completed. -CT abdomen pelvis ordered today for further evaluation

## 2022-08-01 NOTE — Assessment & Plan Note (Signed)
Lipid panel last updated in January.  Total cholesterol 132 and LDL 70.  He is currently prescribed rosuvastatin 5 mg daily.  Expresses frustration because he has to cut a 10 mg tablet in half. -New Rx for rosuvastatin 5 mg tablet written today

## 2022-08-01 NOTE — Assessment & Plan Note (Signed)
EF 45-50% on most recent echo from August 2022.  Euvolemic on exam today.  Spironolactone was recently added to his medication regimen, but he was unaware of this change.  He is additionally prescribed metoprolol and Entresto. -No medication changes today

## 2022-08-01 NOTE — Assessment & Plan Note (Signed)
History of CAD s/p CABG x 4 in 2008.  He is followed by cardiology.  Last seen for follow-up in March.  Denies recent chest pain.  Currently prescribed ASA, metoprolol, and rosuvastatin. -No medication changes today

## 2022-08-01 NOTE — Assessment & Plan Note (Signed)
He endorses persistent reflux symptoms today.  Recent history of H. pylori that was successfully treated.  He is currently taking Protonix as needed for symptom relief. -Recommended taking Protonix 40 mg daily instead of on an as-needed basis. -He will notify us if his symptoms worsen or fail to improve

## 2022-08-01 NOTE — Assessment & Plan Note (Signed)
Previously documented history of confirmed OSA.  Has never worn CPAP but has a dental device.

## 2022-08-01 NOTE — Assessment & Plan Note (Signed)
BP 124/79 today.  He is currently prescribed Entresto 97-103 mg twice daily, metoprolol succinate 12.5 mg daily, and spironolactone 12.5 mg daily. -No medication changes today

## 2022-08-02 ENCOUNTER — Telehealth: Payer: Self-pay | Admitting: Internal Medicine

## 2022-08-02 DIAGNOSIS — R1032 Left lower quadrant pain: Secondary | ICD-10-CM

## 2022-08-02 NOTE — Telephone Encounter (Signed)
Pt called in regard to spironolactone (ALDACTONE) 25 MG tablet   Pharm has not filled med. Not sure why. May need pre auth

## 2022-08-02 NOTE — Telephone Encounter (Signed)
EXAM SUMMARY Your request for CT Abdomen/Pelvis Combination does not meet medical necessity criteria based on the information provided. Please Note: The Clinical Criteria information provided below may not be the actual criteria used when your request is reviewed by an [employer] clinical reviewer. [employer] clinical reviewers use the most current applicable Clinical Criteria based on program design and member plan. Please review the Clinical Criteria information specific to this exam below. Step 1 CT is not authorized by [employer] - I do not know is a Ultrasound will give you the results you want but that does not require a pre-cert.  Exam CT Abdomen/Pelvis Combination     CLINICAL CRITERIA The use of CT for evaluation of hernia is limited to presurgical planning or when complications are suspected. Please confirm your information is accurate: Step 2 Clinical Scenario Left lower quadrant abdominal pain Edit ICD Code / Description R10.32 Left lower quadrant pain CLINICAL DETAILS Edit Which of the following potential sources of pain is suggested by clinical findings? Hernia Select the reason for ordering this exam. Neither of the above ADDITIONAL INFORMATION possible inguinal hernia ....... Problem: Left groin pain Editor: Billie Lade, MD (Physician) His acute concern today is pain in his left groin. He states that this is exacerbated with flexion at the waist such as when attempting to do sit ups. He is concerned that he has a hernia, but no inguinal hernia is detected on exam today. Appears that this is a chronic issue, previously documented in August 2022. Scrotal U/S ordered at that time but never completed. -CT abdomen pelvis ordered today for further evaluation Assessment & Plan Note by Billie Lade, MD at 4/17/ 2

## 2022-08-02 NOTE — Telephone Encounter (Signed)
Can purchase from pharmacy for $30. Called patient and LVM

## 2022-08-13 ENCOUNTER — Ambulatory Visit (HOSPITAL_COMMUNITY): Admission: RE | Admit: 2022-08-13 | Payer: Medicare Other | Source: Ambulatory Visit

## 2022-08-14 ENCOUNTER — Ambulatory Visit (HOSPITAL_COMMUNITY)
Admission: RE | Admit: 2022-08-14 | Discharge: 2022-08-14 | Disposition: A | Discharge status: Home / Self Care | Payer: Medicare Other | Source: Ambulatory Visit | Attending: Internal Medicine | Admitting: Internal Medicine

## 2022-08-14 DIAGNOSIS — N503 Cyst of epididymis: Secondary | ICD-10-CM | POA: Diagnosis not present

## 2022-08-14 DIAGNOSIS — R1032 Left lower quadrant pain: Secondary | ICD-10-CM | POA: Diagnosis not present

## 2022-08-14 DIAGNOSIS — N433 Hydrocele, unspecified: Secondary | ICD-10-CM | POA: Diagnosis not present

## 2022-08-17 ENCOUNTER — Ambulatory Visit (INDEPENDENT_AMBULATORY_CARE_PROVIDER_SITE_OTHER): Payer: Medicare Other | Admitting: Internal Medicine

## 2022-08-17 ENCOUNTER — Encounter: Payer: Self-pay | Admitting: Internal Medicine

## 2022-08-17 VITALS — BP 119/78 | HR 80 | Ht 66.0 in | Wt 193.8 lb

## 2022-08-17 DIAGNOSIS — K4090 Unilateral inguinal hernia, without obstruction or gangrene, not specified as recurrent: Secondary | ICD-10-CM

## 2022-08-17 DIAGNOSIS — Z1283 Encounter for screening for malignant neoplasm of skin: Secondary | ICD-10-CM

## 2022-08-17 NOTE — Patient Instructions (Signed)
It was a pleasure to see you today.  Thank you for giving [place] the opportunity to be involved in your care.  Below is a brief recap of your visit and next steps.  We will plan to see you again in 3 months.  Summary General surgery referral placed today for hernia repair Dermatology referral for skin cancer screening Follow up in 3 months for your annual exam

## 2022-08-17 NOTE — Progress Notes (Signed)
Established Patient Office Visit  Subjective   Patient ID: Scott Roth, male    DOB: Jun 02, 1955  Age: 67 y.o. MRN: [id number]  Chief Complaint  Patient presents with   Results    Go over CT results   [name] returns to care today to review results of a recent scrotal ultrasound that was ordered for further evaluation of left groin pain.  He was last seen by me on 4/12.  No medication changes were made at that time.  There have been no acute interval events. [name] reports feeling well today.  He continues to endorse left groin discomfort and would like to be referred to Dr. Lovell Sheehan (general surgery) for management of left inguinal hernia.  He additionally requests a referral to dermatology for skin examination.  Past Medical History:  Diagnosis Date   Allergy    seasonal allergies   Arthritis    bilateral hands, knees, shoulders   CAD (coronary artery disease)    a. CABG x4 with LIMA to dLAD, free RIMA to OM1, SVG to D1, SVG to OM2 in 2006. b. PCI 2008 with notable ventricular fibrillation during cath) - DES to prox LAD/1st diagonal. c. Cath 07/25/17 demonstrated previously placed stents, patent LIMA-LAD, patent free RIMA-OM, occluded VG-diagonal and occluded Y graft-OM2, without significant change from prior, elevated LVEDP.   Cardiomyopathy (HCC)    EF prev normal 2004, 2008 // EF 35% by cath in 07/2017 // Echo 8/19:  Mild conc LVH, EF 40-45, diff HK, Gr 1 DD, mild AI, mild MR, mod LAE, normal RVSF, mild RAE, mild TR   Chest pain with moderate risk of acute coronary syndrome 07/24/2017   Chronic systolic CHF (congestive heart failure) (HCC)    Erectile dysfunction    GERD (gastroesophageal reflux disease)    on meds   Hypercholesteremia    Hyperlipidemia    on meds   Hypertension    on meds   Sleep apnea    does not wear CPAP mask   Tubular adenoma    Past Surgical History:  Procedure Laterality Date   CARDIAC CATHETERIZATION     X 1 stent in 2008 due to  heart valve collapse after CABG   COLONOSCOPY N/A 11/10/2020   Procedure: COLONOSCOPY;  Surgeon: Malissa Hippo, MD;  Location: [facility];  Service: Endoscopy;  Laterality: N/A;  10:00   CORONARY ARTERY BYPASS GRAFT  2008   x4 vessels   EYE SURGERY Bilateral    Lasik   KNEE ARTHROPLASTY Left    LEFT HEART CATH AND CORS/GRAFTS ANGIOGRAPHY N/A 07/25/2017   Procedure: LEFT HEART CATH AND CORS/GRAFTS ANGIOGRAPHY;  Surgeon: Runell Gess, MD;  Location: [facility];  Service: Cardiovascular;  Laterality: N/A;   POLYPECTOMY  11/10/2020   Procedure: POLYPECTOMY;  Surgeon: Malissa Hippo, MD;  Location: [facility];  Service: Endoscopy;;   SHOULDER SURGERY Right    TOTAL KNEE ARTHROPLASTY Right 08/25/2014   Procedure: RIGHT TOTAL KNEE ARTHROPLASTY;  Surgeon: Mckinley Jewel, MD;  Location: [facility] OR;  Service: Orthopedics;  Laterality: Right;   Social History   Tobacco Use   Smoking status: Never   Smokeless tobacco: Never  Vaping Use   Vaping Use: Never used  Substance Use Topics   Alcohol use: Not Currently    Comment: 1-2 times per month   Drug use: No   Family History  Problem Relation Age of Onset   Heart failure Father    Heart disease  Father    Hypertension Mother    Colon polyps Neg Hx    Colon cancer Neg Hx    Esophageal cancer Neg Hx    Rectal cancer Neg Hx    Stomach cancer Neg Hx    Allergies  Allergen Reactions   Penicillins Itching and Rash    Other reaction(s): Other (See Comments) Has patient had a PCN reaction causing immediate rash, facial/tongue/throat swelling, SOB or lightheadedness with hypotension: No Has patient had a PCN reaction causing severe rash involving mucus membranes or skin necrosis: No Has patient had a PCN reaction that required hospitalization: No Has patient had a PCN reaction occurring within the last 10 years: No If all of the above answers are "NO", then may proceed with Cephalosporin use.   Review of Systems   Genitourinary:        Left groin discomfort  All other systems reviewed and are negative.    Objective:     BP 119/78   Pulse 80   Ht 5\' 6"  (1.676 m)   Wt 193 lb 12.8 oz (87.9 kg)   SpO2 93%   BMI 31.28 kg/m  BP Readings from Last 3 Encounters:  08/17/22 119/78  07/27/22 124/79  06/26/22 127/85   Physical Exam Vitals reviewed.  Constitutional:      General: He is not in acute distress.    Appearance: Normal appearance. He is obese. He is not ill-appearing.  HENT:     Head: Normocephalic and atraumatic.     Right Ear: External ear normal.     Left Ear: External ear normal.     Nose: Nose normal. No congestion or rhinorrhea.     Mouth/Throat:     Mouth: Mucous membranes are moist.     Pharynx: Oropharynx is clear.  Eyes:     General: No scleral icterus.    Extraocular Movements: Extraocular movements intact.     Conjunctiva/sclera: Conjunctivae normal.     Pupils: Pupils are equal, round, and reactive to light.  Cardiovascular:     Rate and Rhythm: Normal rate and regular rhythm.     Pulses: Normal pulses.     Heart sounds: Normal heart sounds. No murmur heard. Pulmonary:     Effort: Pulmonary effort is normal.     Breath sounds: Normal breath sounds. No wheezing, rhonchi or rales.  Abdominal:     General: Abdomen is flat. Bowel sounds are normal. There is no distension.     Palpations: Abdomen is soft.     Tenderness: There is no abdominal tenderness.  Musculoskeletal:        General: No swelling or deformity. Normal range of motion.     Cervical back: Normal range of motion.  Skin:    General: Skin is warm and dry.     Capillary Refill: Capillary refill takes less than 2 seconds.  Neurological:     General: No focal deficit present.     Mental Status: He is alert and oriented to person, place, and time.     Motor: No weakness.  Psychiatric:        Mood and Affect: Mood normal.        Behavior: Behavior normal.        Thought Content: Thought content  normal.   Last CBC Lab Results  Component Value Date   WBC 6.6 10/26/2021   HGB 15.1 10/26/2021   HCT 45.6 10/26/2021   MCV 93 10/26/2021   MCH 30.9 10/26/2021   RDW 13.1  10/26/2021   PLT 295 10/26/2021   Last metabolic panel Lab Results  Component Value Date   GLUCOSE 105 (H) 10/26/2021   NA 142 10/26/2021   K 4.9 10/26/2021   CL 103 10/26/2021   CO2 21 10/26/2021   BUN 16 10/26/2021   CREATININE 0.89 10/26/2021   EGFR 95 10/26/2021   CALCIUM 9.5 10/26/2021   PROT 6.9 04/24/2022   ALBUMIN 4.3 04/24/2022   LABGLOB 2.6 10/26/2021   AGRATIO 1.6 10/26/2021   BILITOT 0.4 04/24/2022   ALKPHOS 74 04/24/2022   AST 18 04/24/2022   ALT 18 04/24/2022   ANIONGAP 10 06/01/2021   Last lipids Lab Results  Component Value Date   CHOL 132 04/24/2022   HDL 45 04/24/2022   LDLCALC 70 04/24/2022   TRIG 88 04/24/2022   CHOLHDL 2.9 04/24/2022   Last hemoglobin A1c Lab Results  Component Value Date   HGBA1C 6.3 (H) 10/26/2021   Last thyroid functions Lab Results  Component Value Date   TSH 2.500 10/26/2021   T4TOTAL 7.3 06/30/2019   Last vitamin D Lab Results  Component Value Date   VD25OH 25.6 (L) 10/26/2021   Last vitamin B12 and Folate Lab Results  Component Value Date   VITAMINB12 1,877 (H) 06/06/2020   The 10-year ASCVD risk score (Arnett DK, et al., 2019) is: 22.6%    Assessment & Plan:   Problem List Items Addressed This Visit       Left inguinal hernia - Primary    Fat-containing left inguinal hernia with no evidence of bowel herniation identified on recent scrotal ultrasound. -General surgery referral placed today.  Patient has specifically requested Dr. Lovell Sheehan.      Skin cancer screening    Dermatology referral placed today patient's request       Return in about 3 months (around 11/17/2022) for CPE.    Billie Lade, MD

## 2022-08-17 NOTE — Assessment & Plan Note (Signed)
Dermatology referral placed today patient's request

## 2022-08-17 NOTE — Assessment & Plan Note (Signed)
Fat-containing left inguinal hernia with no evidence of bowel herniation identified on recent scrotal ultrasound. -General surgery referral placed today.  Patient has specifically requested Dr. Lovell Sheehan.

## 2022-09-04 ENCOUNTER — Ambulatory Visit (INDEPENDENT_AMBULATORY_CARE_PROVIDER_SITE_OTHER): Payer: Medicare Other | Admitting: General Surgery

## 2022-09-04 ENCOUNTER — Encounter: Payer: Self-pay | Admitting: General Surgery

## 2022-09-04 VITALS — BP 109/73 | HR 72 | Temp 97.5°F | Resp 14 | Ht 66.0 in | Wt 196.0 lb

## 2022-09-04 DIAGNOSIS — K4090 Unilateral inguinal hernia, without obstruction or gangrene, not specified as recurrent: Secondary | ICD-10-CM | POA: Diagnosis not present

## 2022-09-05 NOTE — Progress Notes (Signed)
Scott Roth; [id number]; 1955-09-06   HPI Patient is a 67 year old Hispanic male who was referred to my care by Dr. Durwin Nora for evaluation and treatment of a left inguinal hernia.  Patient states he has had intermittent left groin discomfort over the past few months.  It is made worse with straining.  It is not unbearable at the present time.  It is not affecting his daily lifestyle.  He does have a significant cardiac history.  He was last seen by his cardiologist several months ago and was told that he was doing well and some of his medications were reduced.  He denies any nausea.  Ultrasound of the groin revealed a fat-containing left inguinal hernia and a small right hydrocele. Past Medical History:  Diagnosis Date   Allergy    seasonal allergies   Arthritis    bilateral hands, knees, shoulders   CAD (coronary artery disease)    a. CABG x4 with LIMA to dLAD, free RIMA to OM1, SVG to D1, SVG to OM2 in 2006. b. PCI 2008 with notable ventricular fibrillation during cath) - DES to prox LAD/1st diagonal. c. Cath 07/25/17 demonstrated previously placed stents, patent LIMA-LAD, patent free RIMA-OM, occluded VG-diagonal and occluded Y graft-OM2, without significant change from prior, elevated LVEDP.   Cardiomyopathy (HCC)    EF prev normal 2004, 2008 // EF 35% by cath in 07/2017 // Echo 8/19:  Mild conc LVH, EF 40-45, diff HK, Gr 1 DD, mild AI, mild MR, mod LAE, normal RVSF, mild RAE, mild TR   Chest pain with moderate risk of acute coronary syndrome 07/24/2017   Chronic systolic CHF (congestive heart failure) (HCC)    Erectile dysfunction    GERD (gastroesophageal reflux disease)    on meds   Hypercholesteremia    Hyperlipidemia    on meds   Hypertension    on meds   Sleep apnea    does not wear CPAP mask   Tubular adenoma     Past Surgical History:  Procedure Laterality Date   CARDIAC CATHETERIZATION     X 1 stent in 2008 due to heart valve collapse after CABG   COLONOSCOPY N/A  11/10/2020   Procedure: COLONOSCOPY;  Surgeon: Malissa Hippo, MD;  Location: [facility];  Service: Endoscopy;  Laterality: N/A;  10:00   CORONARY ARTERY BYPASS GRAFT  2008   x4 vessels   EYE SURGERY Bilateral    Lasik   KNEE ARTHROPLASTY Left    LEFT HEART CATH AND CORS/GRAFTS ANGIOGRAPHY N/A 07/25/2017   Procedure: LEFT HEART CATH AND CORS/GRAFTS ANGIOGRAPHY;  Surgeon: Runell Gess, MD;  Location: [facility];  Service: Cardiovascular;  Laterality: N/A;   POLYPECTOMY  11/10/2020   Procedure: POLYPECTOMY;  Surgeon: Malissa Hippo, MD;  Location: [facility];  Service: Endoscopy;;   SHOULDER SURGERY Right    TOTAL KNEE ARTHROPLASTY Right 08/25/2014   Procedure: RIGHT TOTAL KNEE ARTHROPLASTY;  Surgeon: Mckinley Jewel, MD;  Location[facility] OR;  Service: Orthopedics;  Laterality: Right;    Family History  Problem Relation Age of Onset   Heart failure Father    Heart disease Father    Hypertension Mother    Colon polyps Neg Hx    Colon cancer Neg Hx    Esophageal cancer Neg Hx    Rectal cancer Neg Hx    Stomach cancer Neg Hx     Current Outpatient Medications on File Prior to Visit  Medication Sig Dispense Refill  aspirin EC 81 MG tablet Take 1 tablet (81 mg total) by mouth daily. Swallow whole.     B Complex-C (SUPER B COMPLEX PO) Take 1 capsule by mouth daily.     Cholecalciferol (VITAMIN D3) 125 MCG (5000 UT) CAPS Take 10,000 Units by mouth daily.      Coenzyme Q10 (COQ10) 100 MG CAPS Take 100 mg by mouth daily.     Cyanocobalamin (B-12 PO) Take 1 tablet by mouth daily.      ENTRESTO 97-103 MG TAKE 1 TABLET BY MOUTH TWICE DAILY 180 tablet 3   furosemide (LASIX) 20 MG tablet Take 20 mg by mouth as needed for fluid.     GARLIC PO Take 1 tablet by mouth daily.     magnesium chloride (SLOW-MAG) 64 MG TBEC SR tablet Take 1 tablet by mouth daily.     meclizine (ANTIVERT) 25 MG tablet Take 1 tablet (25 mg total) by mouth 3 (three) times daily as needed for dizziness. 30  tablet 0   metoprolol succinate (TOPROL XL) 25 MG 24 hr tablet Take 0.5 tablets (12.5 mg total) by mouth daily. 135 tablet 1   Multiple Vitamins-Minerals (MULTIVITAMIN PO) Take 1 tablet by mouth daily.      nitroGLYCERIN (NITROSTAT) 0.4 MG SL tablet PLACE 1 TABLET UNDER THE TONGUE EVERY 5 (FIVE) MINUTES X 3 DOSES AS NEEDED FOR CHEST PAIN. 25 tablet 3   pantoprazole (PROTONIX) 40 MG tablet Take 1 tablet (40 mg total) by mouth daily. 90 tablet 1   rosuvastatin (CRESTOR) 5 MG tablet Take 1 tablet (5 mg total) by mouth daily. 90 tablet 3   spironolactone (ALDACTONE) 25 MG tablet Take 0.5 tablets (12.5 mg total) by mouth daily. 45 tablet 1   TURMERIC PO Take 500 mg by mouth daily.      vitamin C (ASCORBIC ACID) 500 MG tablet Take 500 mg by mouth daily.     VITAMIN E PO Take 400 mg by mouth daily.      Current Facility-Administered Medications on File Prior to Visit  Medication Dose Route Frequency Provider Last Rate Last Admin   betamethasone acetate-betamethasone sodium phosphate (CELESTONE) injection 9 mg  9 mg Other [place], DPM        Allergies  Allergen Reactions   Penicillins Itching and Rash    Other reaction(s): Other (See Comments) Has patient had a PCN reaction causing immediate rash, facial/tongue/throat swelling, SOB or lightheadedness with hypotension: No Has patient had a PCN reaction causing severe rash involving mucus membranes or skin necrosis: No Has patient had a PCN reaction that required hospitalization: No Has patient had a PCN reaction occurring within the last 10 years: No If all of the above answers are "NO", then may proceed with Cephalosporin use.    Social History   Substance and Sexual Activity  Alcohol Use Not Currently   Comment: 1-2 times per month    Social History   Tobacco Use  Smoking Status Never  Smokeless Tobacco Never    Review of Systems  Constitutional:  Positive for malaise/fatigue.  HENT:  Positive for sinus pain.    Eyes: Negative.   Respiratory: Negative.    Cardiovascular: Negative.   Gastrointestinal:  Positive for heartburn.  Genitourinary:  Positive for frequency.  Musculoskeletal:  Positive for back pain, joint pain and neck pain.  Skin: Negative.   Neurological:  Positive for dizziness.  Endo/Heme/Allergies: Negative.   Psychiatric/Behavioral: Negative.      Objective   Vitals:  09/04/22 1433  BP: 109/73  Pulse: 72  Resp: 14  Temp: (!) 97.5 F (36.4 C)  SpO2: 92%    Physical Exam Vitals reviewed.  Constitutional:      Appearance: Normal appearance. He is not ill-appearing.  HENT:     Head: Normocephalic and atraumatic.  Cardiovascular:     Rate and Rhythm: Normal rate and regular rhythm.     Heart sounds: Normal heart sounds. No murmur heard.    No friction rub. No gallop.  Pulmonary:     Effort: Pulmonary effort is normal. No respiratory distress.     Breath sounds: Normal breath sounds. No stridor. No wheezing, rhonchi or rales.  Abdominal:     General: Bowel sounds are normal. There is no distension.     Palpations: Abdomen is soft. There is no mass.     Tenderness: There is no abdominal tenderness. There is no guarding or rebound.     Hernia: A hernia is present.     Comments: Small left inguinal hernia noted.  No right inguinal hernia noted.  Skin:    General: Skin is warm and dry.  Neurological:     Mental Status: He is alert and oriented to person, place, and time.    Cardiology notes reviewed Ultrasound report reviewed Assessment  Fat-containing left inguinal hernia.  No bowel is present.  Patient is at increased risk for any surgical intervention given his cardiac disease.  As he is not too symptomatic, I told him I would wait to have any surgical intervention until absolutely necessary.  Should he require surgical intervention, cardiac clearance would be needed. Plan  Monitor the left inguinal hernia.  I told him to return to my care in the next few  months to reassess or should his symptoms worsen.  Follow-up as needed.

## 2022-10-26 ENCOUNTER — Ambulatory Visit: Payer: Medicare Other | Admitting: Family Medicine

## 2022-11-23 ENCOUNTER — Ambulatory Visit (INDEPENDENT_AMBULATORY_CARE_PROVIDER_SITE_OTHER): Payer: Medicare Other | Admitting: Internal Medicine

## 2022-11-23 ENCOUNTER — Encounter: Payer: Self-pay | Admitting: Internal Medicine

## 2022-11-23 VITALS — BP 133/87 | HR 89 | Ht 66.0 in | Wt 195.0 lb

## 2022-11-23 DIAGNOSIS — I10 Essential (primary) hypertension: Secondary | ICD-10-CM | POA: Diagnosis not present

## 2022-11-23 DIAGNOSIS — E538 Deficiency of other specified B group vitamins: Secondary | ICD-10-CM

## 2022-11-23 DIAGNOSIS — R3912 Poor urinary stream: Secondary | ICD-10-CM

## 2022-11-23 DIAGNOSIS — K219 Gastro-esophageal reflux disease without esophagitis: Secondary | ICD-10-CM

## 2022-11-23 DIAGNOSIS — R7303 Prediabetes: Secondary | ICD-10-CM | POA: Diagnosis not present

## 2022-11-23 DIAGNOSIS — R5383 Other fatigue: Secondary | ICD-10-CM

## 2022-11-23 DIAGNOSIS — E559 Vitamin D deficiency, unspecified: Secondary | ICD-10-CM

## 2022-11-23 DIAGNOSIS — E782 Mixed hyperlipidemia: Secondary | ICD-10-CM | POA: Diagnosis not present

## 2022-11-23 DIAGNOSIS — I2510 Atherosclerotic heart disease of native coronary artery without angina pectoris: Secondary | ICD-10-CM

## 2022-11-23 DIAGNOSIS — N401 Enlarged prostate with lower urinary tract symptoms: Secondary | ICD-10-CM

## 2022-11-23 DIAGNOSIS — Z0001 Encounter for general adult medical examination with abnormal findings: Secondary | ICD-10-CM

## 2022-11-23 DIAGNOSIS — I5022 Chronic systolic (congestive) heart failure: Secondary | ICD-10-CM | POA: Diagnosis not present

## 2022-11-23 DIAGNOSIS — G4730 Sleep apnea, unspecified: Secondary | ICD-10-CM

## 2022-11-23 DIAGNOSIS — N400 Enlarged prostate without lower urinary tract symptoms: Secondary | ICD-10-CM | POA: Insufficient documentation

## 2022-11-23 MED ORDER — PANTOPRAZOLE SODIUM 40 MG PO TBEC
40.0000 mg | DELAYED_RELEASE_TABLET | Freq: Every day | ORAL | 1 refills | Status: DC
Start: 2022-11-23 — End: 2023-03-18

## 2022-11-23 MED ORDER — METOPROLOL SUCCINATE ER 25 MG PO TB24
12.5000 mg | ORAL_TABLET | Freq: Every day | ORAL | 1 refills | Status: DC
Start: 2022-11-23 — End: 2023-03-11

## 2022-11-23 MED ORDER — TAMSULOSIN HCL 0.4 MG PO CAPS
0.4000 mg | ORAL_CAPSULE | Freq: Every day | ORAL | 3 refills | Status: AC
Start: 2022-11-23 — End: ?

## 2022-11-23 NOTE — Assessment & Plan Note (Signed)
Symptoms are adequately controlled with Protonix 40 mg daily.  This has been refilled today.

## 2022-11-23 NOTE — Assessment & Plan Note (Signed)
He endorses recent fatigue, noting that he does not have as much energy as he used to.  No B symptoms identified. -Repeat labs have been ordered today.  Add iron studies and free/total testosterone levels.

## 2022-11-23 NOTE — Assessment & Plan Note (Signed)
Euvolemic on exam today.  He is currently prescribed Entresto, spironolactone, and metoprolol.  Metoprolol has been refilled today.  He will follow-up with cardiology next month.

## 2022-11-23 NOTE — Assessment & Plan Note (Signed)
A1c 6.3 on labs from July 2023.  Repeat A1c ordered today.

## 2022-11-23 NOTE — Assessment & Plan Note (Signed)
 Remains adequately controlled on current antihypertensive regimen.  No medication changes are indicated today.

## 2022-11-23 NOTE — Assessment & Plan Note (Addendum)
Today he endorses increased urinary frequency, incomplete voiding, and small urine stream's.  Concerning for BPH.  Flomax has been prescribed today.  Checking PSA as well.

## 2022-11-23 NOTE — Assessment & Plan Note (Signed)
Annual exam completed today.  Previous records and labs have been reviewed. -Repeat labs ordered today -Remaining preventative care items are up-to-date -He will return to care for routine follow-up in 6 months

## 2022-11-23 NOTE — Patient Instructions (Signed)
It was a pleasure to see you today.  Thank you for giving [place] the opportunity to be involved in your care.  Below is a brief recap of your visit and next steps.  We will plan to see you again in 6 months.  Summary Annual exam completed today Try Flomax for urinary symptoms Repeat labs ordered Follow up in 6 months

## 2022-11-23 NOTE — Assessment & Plan Note (Signed)
Lipid panel updated in January.  LDL at goal.  He is currently prescribed rosuvastatin 5 mg daily.  Repeat lipid panel ordered today.

## 2022-11-23 NOTE — Progress Notes (Signed)
Complete physical exam  Patient: Scott Roth   DOB: 12-30-1955   67 y.o. Male  MRN: [id number]  Subjective:    Chief Complaint  Patient presents with   Annual Exam    Scott Roth is a 67 y.o. male who presents today for a complete physical exam. He reports consuming a general diet. Home exercise routine includes stretching and push-ups. He generally feels fairly well. He reports sleeping well. He does have additional problems to discuss today. Mr. [place] endorses recent fatigue as well as urinary symptoms. He describes increased urinary frequency with incomplete voiding and small volume when urinating.   Most recent fall risk assessment:    09/04/2022    2:33 PM  Fall Risk   Falls in the past year? 0  Follow up Falls evaluation completed     Most recent depression screenings:    08/17/2022    9:18 AM 07/27/2022    9:52 AM  PHQ 2/9 Scores  PHQ - 2 Score 0 1  PHQ- 9 Score 2 3    Vision:Not within last year  and Dental: No current dental problems and No regular dental care   Past Medical History:  Diagnosis Date   Allergy    seasonal allergies   Arthritis    bilateral hands, knees, shoulders   CAD (coronary artery disease)    a. CABG x4 with LIMA to dLAD, free RIMA to OM1, SVG to D1, SVG to OM2 in 2006. b. PCI 2008 with notable ventricular fibrillation during cath) - DES to prox LAD/1st diagonal. c. Cath 07/25/17 demonstrated previously placed stents, patent LIMA-LAD, patent free RIMA-OM, occluded VG-diagonal and occluded Y graft-OM2, without significant change from prior, elevated LVEDP.   Cardiomyopathy (HCC)    EF prev normal 2004, 2008 // EF 35% by cath in 07/2017 // Echo 8/19:  Mild conc LVH, EF 40-45, diff HK, Gr 1 DD, mild AI, mild MR, mod LAE, normal RVSF, mild RAE, mild TR   Chest pain with moderate risk of acute coronary syndrome 07/24/2017   Chronic systolic CHF (congestive heart failure) (HCC)    Erectile dysfunction    GERD (gastroesophageal  reflux disease)    on meds   Hypercholesteremia    Hyperlipidemia    on meds   Hypertension    on meds   Sleep apnea    does not wear CPAP mask   Tubular adenoma    Past Surgical History:  Procedure Laterality Date   CARDIAC CATHETERIZATION     X 1 stent in 2008 due to heart valve collapse after CABG   COLONOSCOPY N/A 11/10/2020   Procedure: COLONOSCOPY;  Surgeon: Malissa Hippo, MD;  [facility];  Service: Endoscopy;  Laterality: N/A;  10:00   CORONARY ARTERY BYPASS GRAFT  2008   x4 vessels   EYE SURGERY Bilateral    Lasik   KNEE ARTHROPLASTY Left    LEFT HEART CATH AND CORS/GRAFTS ANGIOGRAPHY N/A 07/25/2017   Procedure: LEFT HEART CATH AND CORS/GRAFTS ANGIOGRAPHY;  Surgeon: Runell Gess, MD;  [facility];  Service: Cardiovascular;  Laterality: N/A;   POLYPECTOMY  11/10/2020   Procedure: POLYPECTOMY;  Surgeon: Malissa Hippo, MD;  Location: [facility];  Service: Endoscopy;;   SHOULDER SURGERY Right    TOTAL KNEE ARTHROPLASTY Right 08/25/2014   Procedure: RIGHT TOTAL KNEE ARTHROPLASTY;  Surgeon: Mckinley Jewel, MD;  Location: [facility];  Service: Orthopedics;  Laterality: Right;   Social History  Tobacco Use   Smoking status: Never   Smokeless tobacco: Never  Vaping Use   Vaping status: Never Used  Substance Use Topics   Alcohol use: Not Currently    Comment: 1-2 times per month   Drug use: No   Family History  Problem Relation Age of Onset   Heart failure Father    Heart disease Father    Hypertension Mother    Colon polyps Neg Hx    Colon cancer Neg Hx    Esophageal cancer Neg Hx    Rectal cancer Neg Hx    Stomach cancer Neg Hx    Allergies  Allergen Reactions   Penicillins Itching and Rash    Other reaction(s): Other (See Comments) Has patient had a PCN reaction causing immediate rash, facial/tongue/throat swelling, SOB or lightheadedness with hypotension: No Has patient had a PCN reaction causing severe rash involving  mucus membranes or skin necrosis: No Has patient had a PCN reaction that required hospitalization: No Has patient had a PCN reaction occurring within the last 10 years: No If all of the above answers are "NO", then may proceed with Cephalosporin use.   Patient Care Team: Billie Lade, MD as PCP - General (Internal Medicine) Meriam Sprague, MD as Consulting Physician (Cardiology)   Outpatient Medications Prior to Visit  Medication Sig   aspirin EC 81 MG tablet Take 1 tablet (81 mg total) by mouth daily. Swallow whole.   B Complex-C (SUPER B COMPLEX PO) Take 1 capsule by mouth daily.   Cholecalciferol (VITAMIN D3) 125 MCG (5000 UT) CAPS Take 10,000 Units by mouth daily.    Coenzyme Q10 (COQ10) 100 MG CAPS Take 100 mg by mouth daily.   Cyanocobalamin (B-12 PO) Take 1 tablet by mouth daily.    ENTRESTO 97-103 MG TAKE 1 TABLET BY MOUTH TWICE DAILY   furosemide (LASIX) 20 MG tablet Take 20 mg by mouth as needed for fluid.   GARLIC PO Take 1 tablet by mouth daily.   magnesium chloride (SLOW-MAG) 64 MG TBEC SR tablet Take 1 tablet by mouth daily.   meclizine (ANTIVERT) 25 MG tablet Take 1 tablet (25 mg total) by mouth 3 (three) times daily as needed for dizziness.   Multiple Vitamins-Minerals (MULTIVITAMIN PO) Take 1 tablet by mouth daily.    nitroGLYCERIN (NITROSTAT) 0.4 MG SL tablet PLACE 1 TABLET UNDER THE TONGUE EVERY 5 (FIVE) MINUTES X 3 DOSES AS NEEDED FOR CHEST PAIN.   rosuvastatin (CRESTOR) 5 MG tablet Take 1 tablet (5 mg total) by mouth daily.   spironolactone (ALDACTONE) 25 MG tablet Take 0.5 tablets (12.5 mg total) by mouth daily.   TURMERIC PO Take 500 mg by mouth daily.    vitamin C (ASCORBIC ACID) 500 MG tablet Take 500 mg by mouth daily.   VITAMIN E PO Take 400 mg by mouth daily.    [DISCONTINUED] metoprolol succinate (TOPROL XL) 25 MG 24 hr tablet Take 0.5 tablets (12.5 mg total) by mouth daily.   [DISCONTINUED] pantoprazole (PROTONIX) 40 MG tablet Take 1 tablet (40 mg  total) by mouth daily.   Facility-Administered Medications Prior to Visit  Medication Dose Route Frequency Provider   betamethasone acetate-betamethasone sodium phosphate (CELESTONE) injection 9 mg  9 mg Other [place] Liter, DPM   Review of Systems  Constitutional:  Positive for malaise/fatigue. Negative for chills and fever.  HENT:  Negative for sore throat.   Respiratory:  Negative for cough and shortness of breath.   Cardiovascular:  Negative for chest pain, palpitations and leg swelling.  Gastrointestinal:  Negative for abdominal pain, blood in stool, constipation, diarrhea, nausea and vomiting.  Genitourinary:  Positive for frequency. Negative for dysuria and hematuria.  Musculoskeletal:  Negative for myalgias.  Skin:  Negative for itching and rash.  Neurological:  Negative for dizziness and headaches.  Psychiatric/Behavioral:  Negative for depression and suicidal ideas.       Objective:     BP 133/87   Pulse 89   Ht 5\' 6"  (1.676 m)   Wt 195 lb (88.5 kg)   SpO2 93%   BMI 31.47 kg/m  BP Readings from Last 3 Encounters:  11/23/22 133/87  09/04/22 109/73  08/17/22 119/78   Physical Exam Vitals reviewed.  Constitutional:      General: He is not in acute distress.    Appearance: Normal appearance. He is obese. He is not ill-appearing.  HENT:     Head: Normocephalic and atraumatic.     Right Ear: External ear normal.     Left Ear: External ear normal.     Nose: Nose normal. No congestion or rhinorrhea.     Mouth/Throat:     Mouth: Mucous membranes are moist.     Pharynx: Oropharynx is clear.  Eyes:     General: No scleral icterus.    Extraocular Movements: Extraocular movements intact.     Conjunctiva/sclera: Conjunctivae normal.     Pupils: Pupils are equal, round, and reactive to light.  Cardiovascular:     Rate and Rhythm: Normal rate and regular rhythm.     Pulses: Normal pulses.     Heart sounds: Normal heart sounds. No murmur heard. Pulmonary:      Effort: Pulmonary effort is normal.     Breath sounds: Normal breath sounds. No wheezing, rhonchi or rales.  Abdominal:     General: Abdomen is flat. Bowel sounds are normal. There is no distension.     Palpations: Abdomen is soft.     Tenderness: There is no abdominal tenderness.  Musculoskeletal:        General: No swelling or deformity. Normal range of motion.     Cervical back: Normal range of motion.  Skin:    General: Skin is warm and dry.     Capillary Refill: Capillary refill takes less than 2 seconds.  Neurological:     General: No focal deficit present.     Mental Status: He is alert and oriented to person, place, and time.     Motor: No weakness.  Psychiatric:        Mood and Affect: Mood normal.        Behavior: Behavior normal.        Thought Content: Thought content normal.   Last CBC Lab Results  Component Value Date   WBC 6.6 10/26/2021   HGB 15.1 10/26/2021   HCT 45.6 10/26/2021   MCV 93 10/26/2021   MCH 30.9 10/26/2021   RDW 13.1 10/26/2021   PLT 295 10/26/2021   Last metabolic panel Lab Results  Component Value Date   GLUCOSE 105 (H) 10/26/2021   NA 142 10/26/2021   K 4.9 10/26/2021   CL 103 10/26/2021   CO2 21 10/26/2021   BUN 16 10/26/2021   CREATININE 0.89 10/26/2021   EGFR 95 10/26/2021   CALCIUM 9.5 10/26/2021   PROT 6.9 04/24/2022   ALBUMIN 4.3 04/24/2022   LABGLOB 2.6 10/26/2021   AGRATIO 1.6 10/26/2021   BILITOT 0.4 04/24/2022   ALKPHOS 74  04/24/2022   AST 18 04/24/2022   ALT 18 04/24/2022   ANIONGAP 10 06/01/2021   Last lipids Lab Results  Component Value Date   CHOL 132 04/24/2022   HDL 45 04/24/2022   LDLCALC 70 04/24/2022   TRIG 88 04/24/2022   CHOLHDL 2.9 04/24/2022   Last hemoglobin A1c Lab Results  Component Value Date   HGBA1C 6.3 (H) 10/26/2021   Last thyroid functions Lab Results  Component Value Date   TSH 2.500 10/26/2021   T4TOTAL 7.3 06/30/2019   Last vitamin D Lab Results  Component Value Date    VD25OH 25.6 (L) 10/26/2021   Last vitamin B12 and Folate Lab Results  Component Value Date   VITAMINB12 1,877 (H) 06/06/2020      Assessment & Plan:    Routine Health Maintenance and Physical Exam  Immunization History  Administered Date(s) Administered   PFIZER(Purple Top)SARS-COV-2 Vaccination 07/02/2019, 07/16/2019, 07/21/2019, 08/06/2019   PNEUMOCOCCAL CONJUGATE-20 04/04/2021    Health Maintenance  Topic Date Due   DTaP/Tdap/Td (1 - Tdap) Never done   Zoster Vaccines- Shingrix (1 of 2) Never done   COVID-19 Vaccine (5 - 2023-24 season) 12/15/2021   INFLUENZA VACCINE  11/15/2022   Medicare Annual Wellness (AWV)  04/24/2023   Colonoscopy  11/10/2025   Pneumonia Vaccine 4+ Years old  Completed   Hepatitis C Screening  Completed   HPV VACCINES  Aged Out    Discussed health benefits of physical activity, and encouraged him to engage in regular exercise appropriate for his age and condition.  Problem List Items Addressed This Visit       Hypertension - Primary    Remains adequately controlled on current antihypertensive regimen.  No medication changes are indicated today.      CAD (coronary artery disease)    Denies recent chest pain.  History of CAD s/p CABG x 4 in 2008.  Cardiology follow-up scheduled next month.  He endorses compliance with ASA, rosuvastatin, and metoprolol.      Chronic systolic CHF (congestive heart failure) (HCC)    Euvolemic on exam today.  He is currently prescribed Entresto, spironolactone, and metoprolol.  Metoprolol has been refilled today.  He will follow-up with cardiology next month.      Esophageal reflux    Symptoms are adequately controlled with Protonix 40 mg daily.  This has been refilled today.      BPH (benign prostatic hyperplasia)    Today he endorses increased urinary frequency, incomplete voiding, and small urine stream's.  Concerning for BPH.  Flomax has been prescribed today.  Checking PSA as well.      Hyperlipidemia     Lipid panel updated in January.  LDL at goal.  He is currently prescribed rosuvastatin 5 mg daily.  Repeat lipid panel ordered today.      Prediabetes    A1c 6.3 on labs from July 2023.  Repeat A1c ordered today.      Fatigue    He endorses recent fatigue, noting that he does not have as much energy as he used to.  No B symptoms identified. -Repeat labs have been ordered today.  Add iron studies and free/total testosterone levels.      Encounter for well adult exam with abnormal findings    Annual exam completed today.  Previous records and labs have been reviewed. -Repeat labs ordered today -Remaining preventative care items are up-to-date -He will return to care for routine follow-up in 6 months      Return  in about 6 months (around 05/26/2023).  Billie Lade, MD

## 2022-11-23 NOTE — Assessment & Plan Note (Signed)
Denies recent chest pain.  History of CAD s/p CABG x 4 in 2008.  Cardiology follow-up scheduled next month.  He endorses compliance with ASA, rosuvastatin, and metoprolol.

## 2022-11-26 ENCOUNTER — Other Ambulatory Visit: Payer: Self-pay | Admitting: Internal Medicine

## 2022-11-26 DIAGNOSIS — E782 Mixed hyperlipidemia: Secondary | ICD-10-CM

## 2022-11-26 MED ORDER — ROSUVASTATIN CALCIUM 10 MG PO TABS
10.0000 mg | ORAL_TABLET | Freq: Every day | ORAL | 3 refills | Status: DC
Start: 2022-11-26 — End: 2023-03-11

## 2023-01-04 ENCOUNTER — Ambulatory Visit (HOSPITAL_COMMUNITY): Payer: Medicare Other | Attending: Nurse Practitioner

## 2023-01-04 ENCOUNTER — Ambulatory Visit (HOSPITAL_COMMUNITY): Payer: Medicare Other

## 2023-01-04 ENCOUNTER — Ambulatory Visit: Payer: PRIVATE HEALTH INSURANCE | Admitting: Cardiology

## 2023-01-08 ENCOUNTER — Ambulatory Visit: Payer: PRIVATE HEALTH INSURANCE | Admitting: Internal Medicine

## 2023-01-15 ENCOUNTER — Ambulatory Visit (HOSPITAL_COMMUNITY): Payer: Medicare Other | Attending: Cardiology

## 2023-01-15 DIAGNOSIS — I5022 Chronic systolic (congestive) heart failure: Secondary | ICD-10-CM | POA: Insufficient documentation

## 2023-01-15 LAB — ECHOCARDIOGRAM COMPLETE
Area-P 1/2: 2.02 cm2
Calc EF: 60.4 %
P 1/2 time: 703 ms
S' Lateral: 4 cm
Single Plane A2C EF: 58.8 %
Single Plane A4C EF: 60.9 %

## 2023-01-15 NOTE — Progress Notes (Signed)
Patient ID: Scott Roth, male   DOB: 10-20-1955, 67 y.o.   MRN: [id number]  2D Echocardiogram has been performed.  Milda Smart, RDCS.

## 2023-01-31 ENCOUNTER — Ambulatory Visit: Payer: Medicare Other | Admitting: Internal Medicine

## 2023-01-31 NOTE — Progress Notes (Signed)
Erroneous encounter - please disregard.

## 2023-02-27 ENCOUNTER — Encounter: Payer: Self-pay | Admitting: Internal Medicine

## 2023-02-27 ENCOUNTER — Ambulatory Visit: Payer: Medicare Other | Attending: Cardiology | Admitting: Internal Medicine

## 2023-02-27 VITALS — BP 122/78 | HR 75 | Ht 67.0 in | Wt 194.4 lb

## 2023-02-27 DIAGNOSIS — G4730 Sleep apnea, unspecified: Secondary | ICD-10-CM

## 2023-02-27 DIAGNOSIS — I2510 Atherosclerotic heart disease of native coronary artery without angina pectoris: Secondary | ICD-10-CM

## 2023-02-27 DIAGNOSIS — I5022 Chronic systolic (congestive) heart failure: Secondary | ICD-10-CM | POA: Diagnosis not present

## 2023-02-27 DIAGNOSIS — Z79899 Other long term (current) drug therapy: Secondary | ICD-10-CM | POA: Diagnosis not present

## 2023-02-27 DIAGNOSIS — I428 Other cardiomyopathies: Secondary | ICD-10-CM | POA: Insufficient documentation

## 2023-02-27 MED ORDER — SPIRONOLACTONE 25 MG PO TABS: 25.0000 mg | ORAL_TABLET | Freq: Every day | ORAL | 3 refills | Status: AC

## 2023-02-27 NOTE — Patient Instructions (Addendum)
Medication Instructions:  Your physician has recommended you make the following change in your medication:  Increase Spironolactone 25 mg once daily Continue taking all other medications as prescribed   Labwork: BMET in one week at [facility] in [place]  Testing/Procedures: None  Follow-Up: Your physician recommends that you schedule a follow-up appointment in: 6 months  Any Other Special Instructions Will Be Listed Below (If Applicable). Referral to Sleep Medicine  Thank you for choosing [facility]!      If you need a refill on your cardiac medications before your next appointment, please call your pharmacy.

## 2023-02-27 NOTE — Progress Notes (Signed)
Cardiology Office Note  Date: 02/27/2023   ID: Scott Roth, DOB 15-Oct-1955, MRN [id number]  PCP:  Billie Lade, MD  Cardiologist:  Marjo Bicker, MD Electrophysiologist:  None   History of Present Illness: Scott Roth is a 67 y.o. male  He has good and bad days, sometimes has DOE and fatigue.  Weight stable.  No angina, dizziness, syncope and leg swelling.  He has a diagnosis of OSA, did not tolerate CPAP and not interested.   He is from [place] and has multiple companies here.  He was previously on [employer] but not able to afford it due to increased cost, $600 every month.  Does not qualify for patient assistance program as he makes too much money.  Past Medical History:  Diagnosis Date   Allergy    seasonal allergies   Arthritis    bilateral hands, knees, shoulders   CAD (coronary artery disease)    a. CABG x4 with LIMA to dLAD, free RIMA to OM1, SVG to D1, SVG to OM2 in 2006. b. PCI 2008 with notable ventricular fibrillation during cath) - DES to prox LAD/1st diagonal. c. Cath 07/25/17 demonstrated previously placed stents, patent LIMA-LAD, patent free RIMA-OM, occluded VG-diagonal and occluded Y graft-OM2, without significant change from prior, elevated LVEDP.   Cardiomyopathy (HCC)    EF prev normal 2004, 2008 // EF 35% by cath in 07/2017 // Echo 8/19:  Mild conc LVH, EF 40-45, diff HK, Gr 1 DD, mild AI, mild MR, mod LAE, normal RVSF, mild RAE, mild TR   Chest pain with moderate risk of acute coronary syndrome 07/24/2017   Chronic systolic CHF (congestive heart failure) (HCC)    Erectile dysfunction    GERD (gastroesophageal reflux disease)    on meds   Hypercholesteremia    Hyperlipidemia    on meds   Hypertension    on meds   Sleep apnea    does not wear CPAP mask   Tubular adenoma     Past Surgical History:  Procedure Laterality Date   CARDIAC CATHETERIZATION     X 1 stent in 2008 due to heart valve collapse after CABG   COLONOSCOPY N/A  11/10/2020   Procedure: COLONOSCOPY;  Surgeon: Malissa Hippo, MD;  [facility];  Service: Endoscopy;  Laterality: N/A;  10:00   CORONARY ARTERY BYPASS GRAFT  2008   x4 vessels   EYE SURGERY Bilateral    Lasik   KNEE ARTHROPLASTY Left    LEFT HEART CATH AND CORS/GRAFTS ANGIOGRAPHY N/A 07/25/2017   Procedure: LEFT HEART CATH AND CORS/GRAFTS ANGIOGRAPHY;  Surgeon: Runell Gess, MD;  [facility];  Service: Cardiovascular;  Laterality: N/A;   POLYPECTOMY  11/10/2020   Procedure: POLYPECTOMY;  Surgeon: Malissa Hippo, MD;  [facility];  Service: Endoscopy;;   SHOULDER SURGERY Right    TOTAL KNEE ARTHROPLASTY Right 08/25/2014   Procedure: RIGHT TOTAL KNEE ARTHROPLASTY;  Surgeon: Mckinley Jewel, MD;  Location: [facility];  Service: Orthopedics;  Laterality: Right;    Current Outpatient Medications  Medication Sig Dispense Refill   aspirin EC 81 MG tablet Take 1 tablet (81 mg total) by mouth daily. Swallow whole.     B Complex-C (SUPER B COMPLEX PO) Take 1 capsule by mouth daily.     Cholecalciferol (VITAMIN D3) 125 MCG (5000 UT) CAPS Take 10,000 Units by mouth daily.      Coenzyme Q10 (COQ10) 100 MG CAPS Take 100  mg by mouth daily.     Cyanocobalamin (B-12 PO) Take 1 tablet by mouth daily.      ENTRESTO 97-103 MG TAKE 1 TABLET BY MOUTH TWICE DAILY 180 tablet 3   furosemide (LASIX) 20 MG tablet Take 20 mg by mouth as needed for fluid.     GARLIC PO Take 1 tablet by mouth daily.     magnesium chloride (SLOW-MAG) 64 MG TBEC SR tablet Take 1 tablet by mouth daily.     meclizine (ANTIVERT) 25 MG tablet Take 1 tablet (25 mg total) by mouth 3 (three) times daily as needed for dizziness. 30 tablet 0   metoprolol succinate (TOPROL XL) 25 MG 24 hr tablet Take 0.5 tablets (12.5 mg total) by mouth daily. 135 tablet 1   Multiple Vitamins-Minerals (MULTIVITAMIN PO) Take 1 tablet by mouth daily.      nitroGLYCERIN (NITROSTAT) 0.4 MG SL tablet PLACE 1 TABLET UNDER THE  TONGUE EVERY 5 (FIVE) MINUTES X 3 DOSES AS NEEDED FOR CHEST PAIN. 25 tablet 3   pantoprazole (PROTONIX) 40 MG tablet Take 1 tablet (40 mg total) by mouth daily. 90 tablet 1   rosuvastatin (CRESTOR) 10 MG tablet Take 1 tablet (10 mg total) by mouth daily. 90 tablet 3   tamsulosin (FLOMAX) 0.4 MG CAPS capsule Take 1 capsule (0.4 mg total) by mouth daily. 30 capsule 3   TURMERIC PO Take 500 mg by mouth daily.      vitamin C (ASCORBIC ACID) 500 MG tablet Take 500 mg by mouth daily.     VITAMIN E PO Take 400 mg by mouth daily.      spironolactone (ALDACTONE) 25 MG tablet Take 1 tablet (25 mg total) by mouth daily. 30 tablet 3   Current Facility-Administered Medications  Medication Dose Route Frequency Provider Last Rate Last Admin   betamethasone acetate-betamethasone sodium phosphate (CELESTONE) injection 9 mg  9 mg Other [place] Liter, DPM       Allergies:  Penicillins   Social History: The patient  reports that he has never smoked. He has never used smokeless tobacco. He reports that he does not currently use alcohol. He reports that he does not use drugs.   Family History: The patient's family history includes Heart disease in his father; Heart failure in his father; Hypertension in his mother.   ROS:  Please see the history of present illness. Otherwise, complete review of systems is positive for none  All other systems are reviewed and negative.   Physical Exam: VS:  BP 122/78   Pulse 75   Ht 5\' 7"  (1.702 m)   Wt 194 lb 6.4 oz (88.2 kg)   SpO2 97%   BMI 30.45 kg/m , BMI Body mass index is 30.45 kg/m.  Wt Readings from Last 3 Encounters:  02/27/23 194 lb 6.4 oz (88.2 kg)  11/23/22 195 lb (88.5 kg)  09/04/22 196 lb (88.9 kg)    General: Patient appears comfortable at rest. HEENT: Conjunctiva and lids normal, oropharynx clear with moist mucosa. Neck: Supple, no elevated JVP or carotid bruits, no thyromegaly. Lungs: Clear to auscultation, nonlabored breathing at  rest. Cardiac: Regular rate and rhythm, no S3 or significant systolic murmur, no pericardial rub. Abdomen: Soft, nontender, no hepatomegaly, bowel sounds present, no guarding or rebound. Extremities: No pitting edema, distal pulses 2+. Skin: Warm and dry. Musculoskeletal: No kyphosis. Neuropsychiatric: Alert and oriented x3, affect grossly appropriate.  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  Weight stable. Continue cardioprotective medications, Aspir 81 milligram once daily, rosuvastatin 10 mg nightly. -Continue GDMT, p.o. Lasix 20 mg as needed, metoprolol succinate 12.5 mg once daily, Entresto 97-103 mg twice daily, start spironolactone 25 mg once daily.  He was previously prescribed spironolactone but pharmacy did not fill it.  Obtain BMP in 5 days.  Repeat echocardiogram from 01/2023 showed LVEF 40 to 45%, similar to 2019. -He has OSA, not on CPAP, does not tolerate.  He has nonischemic cardiomyopathy with LVEF 40 to 45% since 2019, stressed importance of OSA management.  Will place referral to sleep medicine to discuss treatment options for OSA.  I spent a total duration of 30  minutes reviewing prior notes, echo reports, cath report, discussed the importance of GDMT, discussed the medication side effects and importance of OSA treatment in preventing worsening of his cardiomyopathy.      Medication Adjustments/Labs and Tests Ordered: Current medicines are reviewed at length with the patient today.  Concerns regarding medicines are outlined above.    Disposition:  Follow up  6 months  Signed Kaylub Detienne Verne Spurr, MD, 02/27/2023 1:16 PM    [facility] at [facility] [address] [place], [place], [place] [address]

## 2023-03-09 ENCOUNTER — Encounter: Payer: Self-pay | Admitting: Internal Medicine

## 2023-03-11 ENCOUNTER — Other Ambulatory Visit: Payer: Self-pay | Admitting: Internal Medicine

## 2023-03-11 DIAGNOSIS — E782 Mixed hyperlipidemia: Secondary | ICD-10-CM

## 2023-03-11 DIAGNOSIS — I5022 Chronic systolic (congestive) heart failure: Secondary | ICD-10-CM

## 2023-03-11 MED ORDER — ROSUVASTATIN CALCIUM 10 MG PO TABS: 10.0000 mg | ORAL_TABLET | Freq: Every day | ORAL | 3 refills | Status: DC

## 2023-03-11 MED ORDER — METOPROLOL SUCCINATE ER 25 MG PO TB24: 12.5000 mg | ORAL_TABLET | Freq: Every day | ORAL | 1 refills | Status: DC

## 2023-03-18 ENCOUNTER — Telehealth: Payer: Self-pay | Admitting: Internal Medicine

## 2023-03-18 ENCOUNTER — Other Ambulatory Visit: Payer: Self-pay

## 2023-03-18 DIAGNOSIS — K219 Gastro-esophageal reflux disease without esophagitis: Secondary | ICD-10-CM

## 2023-03-18 DIAGNOSIS — I5022 Chronic systolic (congestive) heart failure: Secondary | ICD-10-CM | POA: Diagnosis not present

## 2023-03-18 DIAGNOSIS — Z79899 Other long term (current) drug therapy: Secondary | ICD-10-CM | POA: Diagnosis not present

## 2023-03-18 MED ORDER — PANTOPRAZOLE SODIUM 40 MG PO TBEC: 40.0000 mg | DELAYED_RELEASE_TABLET | Freq: Every day | ORAL | 1 refills | Status: AC

## 2023-03-18 NOTE — Telephone Encounter (Signed)
Patient came by the office he is completely out of this medicine, needs refill.  pantoprazole (PROTONIX) 40 MG tablet   Pharmacy: [facility]

## 2023-03-18 NOTE — Telephone Encounter (Signed)
Refills sent to pharmacy. 

## 2023-03-19 LAB — BASIC METABOLIC PANEL
BUN/Creatinine Ratio: 19 (ref 10–24)
BUN: 17 mg/dL (ref 8–27)
CO2: 21 mmol/L (ref 20–29)
Calcium: 9.4 mg/dL (ref 8.6–10.2)
Chloride: 103 mmol/L (ref 96–106)
Creatinine, Ser: 0.91 mg/dL (ref 0.76–1.27)
Glucose: 117 mg/dL — ABNORMAL HIGH (ref 70–99)
Potassium: 4.7 mmol/L (ref 3.5–5.2)
Sodium: 139 mmol/L (ref 134–144)
eGFR: 92 mL/min/{1.73_m2} (ref >59)

## 2023-04-02 ENCOUNTER — Telehealth: Payer: Self-pay

## 2023-04-02 NOTE — Telephone Encounter (Signed)
-----   Message from Vishnu P Mallipeddi sent at 03/29/2023 10:44 AM EST ----- Normal BMP but he stopped taking spironolactone.

## 2023-04-02 NOTE — Telephone Encounter (Signed)
Patient informed and verbalized understanding of plan. 

## 2023-04-20 DIAGNOSIS — R1032 Left lower quadrant pain: Secondary | ICD-10-CM | POA: Diagnosis not present

## 2023-04-20 DIAGNOSIS — A084 Viral intestinal infection, unspecified: Secondary | ICD-10-CM | POA: Diagnosis not present

## 2023-05-06 ENCOUNTER — Ambulatory Visit (INDEPENDENT_AMBULATORY_CARE_PROVIDER_SITE_OTHER): Payer: Medicare Other

## 2023-05-06 VITALS — BP 152/80 | HR 72 | Resp 12 | Ht 66.0 in | Wt 200.0 lb

## 2023-05-06 DIAGNOSIS — Z0000 Encounter for general adult medical examination without abnormal findings: Secondary | ICD-10-CM

## 2023-05-06 DIAGNOSIS — Z0100 Encounter for examination of eyes and vision without abnormal findings: Secondary | ICD-10-CM

## 2023-05-06 NOTE — Patient Instructions (Signed)
[name] , Thank you for taking time to come for your Medicare Wellness Visit. I appreciate your ongoing commitment to your health goals. Please review the following plan we discussed and let me know if I can assist you in the future.   Referrals/Orders/Follow-Ups/Clinician Recommendations:  Next Medicare Annual Wellness Visit: May 06, 2024 at 10:40 am in person  You have been referred to establish care with a new eye care provider. If you have not heard from them in the next week, please call to schedule your appointment  [facility] [address] [place] [address] Ph [phone]   [facility] [address] [place],  [place]  [address] Main: [phone]   This is a list of the screening recommended for you and due dates:  Health Maintenance  Topic Date Due   DTaP/Tdap/Td vaccine (1 - Tdap) Never done   Zoster (Shingles) Vaccine (1 of 2) Never done   Flu Shot  Never done   COVID-19 Vaccine (5 - 2024-25 season) 12/16/2022   Medicare Annual Wellness Visit  05/05/2024   Colon Cancer Screening  11/10/2025   Pneumonia Vaccine  Completed   Hepatitis C Screening  Completed   HPV Vaccine  Aged Out    Advanced directives: (ACP Link)Information on Advanced Care Planning can be found at [facility] Advance Health Care Directives Advance Health Care Directives ([email or web address]   Next Medicare Annual Wellness Visit scheduled for next year: yes  Preventive Care 65 Years and Older, Male Preventive care refers to lifestyle choices and visits with your health care provider that can promote health and wellness. Preventive care visits are also called wellness exams. What can I expect for my preventive care visit? Counseling During your preventive care visit, your health care provider may ask about your: Medical history, including: Past medical problems. Family medical history. History of falls. Current health,  including: Emotional well-being. Home life and relationship well-being. Sexual activity. Memory and ability to understand (cognition). Lifestyle, including: Alcohol, nicotine or tobacco, and drug use. Access to firearms. Diet, exercise, and sleep habits. Work and work Astronomer. Sunscreen use. Safety issues such as seatbelt and bike helmet use. Physical exam Your health care provider will check your: Height and weight. These may be used to calculate your BMI (body mass index). BMI is a measurement that tells if you are at a healthy weight. Waist circumference. This measures the distance around your waistline. This measurement also tells if you are at a healthy weight and may help predict your risk of certain diseases, such as type 2 diabetes and high blood pressure. Heart rate and blood pressure. Body temperature. Skin for abnormal spots. What immunizations do I need?  Vaccines are usually given at various ages, according to a schedule. Your health care provider will recommend vaccines for you based on your age, medical history, and lifestyle or other factors, such as travel or where you work. What tests do I need? Screening Your health care provider may recommend screening tests for certain conditions. This may include: Lipid and cholesterol levels. Diabetes screening. This is done by checking your blood sugar (glucose) after you have not eaten for a while (fasting). Hepatitis C test. Hepatitis B test. HIV (human immunodeficiency virus) test. STI (sexually transmitted infection) testing, if you are at risk. Lung cancer screening. Colorectal cancer screening. Prostate cancer screening. Abdominal aortic aneurysm (AAA) screening. You may need this if you are a current or former smoker. Talk with your  health care provider about your test results, treatment options, and if necessary, the need for more tests. Follow these instructions at home: Eating and drinking  Eat a diet that  includes fresh fruits and vegetables, whole grains, lean protein, and low-fat dairy products. Limit your intake of foods with high amounts of sugar, saturated fats, and salt. Take vitamin and mineral supplements as recommended by your health care provider. Do not drink alcohol if your health care provider tells you not to drink. If you drink alcohol: Limit how much you have to 0-2 drinks a day. Know how much alcohol is in your drink. In the [place] one drink equals one 12 oz bottle of beer (355 mL), one 5 oz glass of wine (148 mL), or one 1 oz glass of hard liquor (44 mL). Lifestyle Brush your teeth every morning and night with fluoride toothpaste. Floss one time each day. Exercise for at least 30 minutes 5 or more days each week. Do not use any products that contain nicotine or tobacco. These products include cigarettes, chewing tobacco, and vaping devices, such as e-cigarettes. If you need help quitting, ask your health care provider. Do not use drugs. If you are sexually active, practice safe sex. Use a condom or other form of protection to prevent STIs. Take aspirin only as told by your health care provider. Make sure that you understand how much to take and what form to take. Work with your health care provider to find out whether it is safe and beneficial for you to take aspirin daily. Ask your health care provider if you need to take a cholesterol-lowering medicine (statin). Find healthy ways to manage stress, such as: Meditation, yoga, or listening to music. Journaling. Talking to a trusted person. Spending time with friends and family. Safety Always wear your seat belt while driving or riding in a vehicle. Do not drive: If you have been drinking alcohol. Do not ride with someone who has been drinking. When you are tired or distracted. While texting. If you have been using any mind-altering substances or drugs. Wear a helmet and other protective equipment during sports  activities. If you have firearms in your house, make sure you follow all gun safety procedures. Minimize exposure to UV radiation to reduce your risk of skin cancer. What's next? Visit your health care provider once a year for an annual wellness visit. Ask your health care provider how often you should have your eyes and teeth checked. Stay up to date on all vaccines. This information is not intended to replace advice given to you by your health care provider. Make sure you discuss any questions you have with your health care provider. Document Revised: 09/28/2020 Document Reviewed: 09/28/2020 Elsevier Patient Education  2024 [employer].  Understanding Your Risk for Falls Millions of people have serious injuries from falls each year. It is important to understand your risk of falling. Talk with your health care provider about your risk and what you can do to lower it. If you do have a serious fall, make sure to tell your provider. Falling once raises your risk of falling again. How can falls affect me? Serious injuries from falls are common. These include: Broken bones, such as hip fractures. Head injuries, such as traumatic brain injuries (TBI) or concussions. A fear of falling can cause you to avoid activities and stay at home. This can make your muscles weaker and raise your risk for a fall. What can increase my risk? There are a number  of risk factors that increase your risk for falling. The more risk factors you have, the higher your risk of falling. Serious injuries from a fall happen most often to people who are older than 68 years old. Teenagers and young adults ages 32-29 are also at higher risk. Common risk factors include: Weakness in the lower body. Being generally weak or confused due to long-term (chronic) illness. Dizziness or balance problems. Poor vision. Medicines that cause dizziness or drowsiness. These may include: Medicines for your blood pressure, heart, anxiety,  insomnia, or swelling (edema). Pain medicines. Muscle relaxants. Other risk factors include: Drinking alcohol. Having had a fall in the past. Having foot pain or wearing improper footwear. Working at a dangerous job. Having any of the following in your home: Tripping hazards, such as floor clutter or loose rugs. Poor lighting. Pets. Having dementia or memory loss. What actions can I take to lower my risk of falling?     Physical activity Stay physically fit. Do strength and balance exercises. Consider taking a regular class to build strength and balance. Yoga and tai chi are good options. Vision Have your eyes checked every year and your prescription for glasses or contacts updated as needed. Shoes and walking aids Wear non-skid shoes. Wear shoes that have rubber soles and low heels. Do not wear high heels. Do not walk around the house in socks or slippers. Use a cane or walker as told by your provider. Home safety Attach secure railings on both sides of your stairs. Install grab bars for your bathtub, shower, and toilet. Use a non-skid mat in your bathtub or shower. Attach bath mats securely with double-sided, non-slip rug tape. Use good lighting in all rooms. Keep a flashlight near your bed. Make sure there is a clear path from your bed to the bathroom. Use night-lights. Do not use throw rugs. Make sure all carpeting is taped or tacked down securely. Remove all clutter from walkways and stairways, including extension cords. Repair uneven or broken steps and floors. Avoid walking on icy or slippery surfaces. Walk on the grass instead of on icy or slick sidewalks. Use ice melter to get rid of ice on walkways in the winter. Use a cordless phone. Questions to ask your health care provider Can you help me check my risk for a fall? Do any of my medicines make me more likely to fall? Should I take a vitamin D supplement? What exercises can I do to improve my strength and  balance? Should I make an appointment to have my vision checked? Do I need a bone density test to check for weak bones (osteoporosis)? Would it help to use a cane or a walker? Where to find more information Centers for Disease Control and Prevention, [employer]: TonerPromos.no Community-Based Fall Prevention Programs: TonerPromos.no [employer] on Aging: BaseRingTones.pl Contact a health care provider if: You fall at home. You are afraid of falling at home. You feel weak, drowsy, or dizzy. This information is not intended to replace advice given to you by your health care provider. Make sure you discuss any questions you have with your health care provider. Document Revised: 12/04/2021 Document Reviewed: 12/04/2021 Elsevier Patient Education  2024 [employer].

## 2023-05-06 NOTE — Progress Notes (Signed)
Subjective:   Scott Roth is a 68 y.o. male who presents for Medicare Annual/Subsequent preventive examination.  Visit Complete: In person  Patient Medicare AWV questionnaire was completed by the patient on na; I have confirmed that all information answered by patient is correct and no changes since this date.  Cardiac Risk Factors include: advanced age (>41men, >50 women);dyslipidemia;hypertension;male gender;obesity (BMI >30kg/m2)     Objective:    Today's Vitals   05/06/23 1438  BP: (!) 152/80  Pulse: 72  Resp: 12  SpO2: 98%  Weight: 200 lb (90.7 kg)  Height: 5\' 6"  (1.676 m)   Body mass index is 32.28 kg/m.     05/06/2023    2:51 PM 04/23/2022    9:17 AM 06/01/2021   12:12 PM 04/04/2021   10:13 AM 11/10/2020    9:55 AM 09/18/2017    5:51 AM 07/24/2017    8:28 PM  Advanced Directives  Does Patient Have a Medical Advance Directive? No No No No;Yes No Yes No  Type of Advance Directive    Healthcare Power of [employer] Power of Attorney   Does patient want to make changes to medical advance directive?    Yes (Inpatient - patient defers changing a medical advance directive and declines information at this time)  No - Patient declined   Copy of Healthcare Power of Attorney in Chart?    No - copy requested  No - copy requested   Would patient like information on creating a medical advance directive? No - Patient declined No - Patient declined  Yes (Inpatient - patient defers creating a medical advance directive and declines information at this time) No - Patient declined  No - Patient declined    Current Medications (verified) Outpatient Encounter Medications as of 05/06/2023  Medication Sig   aspirin EC 81 MG tablet Take 1 tablet (81 mg total) by mouth daily. Swallow whole.   B Complex-C (SUPER B COMPLEX PO) Take 1 capsule by mouth daily.   Cholecalciferol (VITAMIN D3) 125 MCG (5000 UT) CAPS Take 10,000 Units by mouth daily.    Coenzyme Q10 (COQ10) 100 MG CAPS  Take 100 mg by mouth daily.   Cyanocobalamin (B-12 PO) Take 1 tablet by mouth daily.    ENTRESTO 97-103 MG TAKE 1 TABLET BY MOUTH TWICE DAILY   furosemide (LASIX) 20 MG tablet Take 20 mg by mouth as needed for fluid.   GARLIC PO Take 1 tablet by mouth daily.   magnesium chloride (SLOW-MAG) 64 MG TBEC SR tablet Take 1 tablet by mouth daily.   meclizine (ANTIVERT) 25 MG tablet Take 1 tablet (25 mg total) by mouth 3 (three) times daily as needed for dizziness.   metoprolol succinate (TOPROL XL) 25 MG 24 hr tablet Take 0.5 tablets (12.5 mg total) by mouth daily.   Multiple Vitamins-Minerals (MULTIVITAMIN PO) Take 1 tablet by mouth daily.    nitroGLYCERIN (NITROSTAT) 0.4 MG SL tablet PLACE 1 TABLET UNDER THE TONGUE EVERY 5 (FIVE) MINUTES X 3 DOSES AS NEEDED FOR CHEST PAIN.   pantoprazole (PROTONIX) 40 MG tablet Take 1 tablet (40 mg total) by mouth daily.   rosuvastatin (CRESTOR) 10 MG tablet Take 1 tablet (10 mg total) by mouth daily.   spironolactone (ALDACTONE) 25 MG tablet Take 1 tablet (25 mg total) by mouth daily.   tamsulosin (FLOMAX) 0.4 MG CAPS capsule Take 1 capsule (0.4 mg total) by mouth daily.   TURMERIC PO Take 500 mg by mouth daily.  vitamin C (ASCORBIC ACID) 500 MG tablet Take 500 mg by mouth daily.   VITAMIN E PO Take 400 mg by mouth daily.    Facility-Administered Encounter Medications as of 05/06/2023  Medication   betamethasone acetate-betamethasone sodium phosphate (CELESTONE) injection 9 mg    Allergies (verified) Penicillins   History: Past Medical History:  Diagnosis Date   Allergy    seasonal allergies   Arthritis    bilateral hands, knees, shoulders   CAD (coronary artery disease)    a. CABG x4 with LIMA to dLAD, free RIMA to OM1, SVG to D1, SVG to OM2 in 2006. b. PCI 2008 with notable ventricular fibrillation during cath) - DES to prox LAD/1st diagonal. c. Cath 07/25/17 demonstrated previously placed stents, patent LIMA-LAD, patent free RIMA-OM, occluded  VG-diagonal and occluded Y graft-OM2, without significant change from prior, elevated LVEDP.   Cardiomyopathy (HCC)    EF prev normal 2004, 2008 // EF 35% by cath in 07/2017 // Echo 8/19:  Mild conc LVH, EF 40-45, diff HK, Gr 1 DD, mild AI, mild MR, mod LAE, normal RVSF, mild RAE, mild TR   Chest pain with moderate risk of acute coronary syndrome 07/24/2017   Chronic systolic CHF (congestive heart failure) (HCC)    Erectile dysfunction    GERD (gastroesophageal reflux disease)    on meds   Hypercholesteremia    Hyperlipidemia    on meds   Hypertension    on meds   Sleep apnea    does not wear CPAP mask   Tubular adenoma    Past Surgical History:  Procedure Laterality Date   CARDIAC CATHETERIZATION     X 1 stent in 2008 due to heart valve collapse after CABG   COLONOSCOPY N/A 11/10/2020   Procedure: COLONOSCOPY;  Surgeon: Malissa Hippo, MD;  Location: [facility];  Service: Endoscopy;  Laterality: N/A;  10:00   CORONARY ARTERY BYPASS GRAFT  2008   x4 vessels   EYE SURGERY Bilateral    Lasik   KNEE ARTHROPLASTY Left    LEFT HEART CATH AND CORS/GRAFTS ANGIOGRAPHY N/A 07/25/2017   Procedure: LEFT HEART CATH AND CORS/GRAFTS ANGIOGRAPHY;  Surgeon: Runell Gess, MD;  Location: [facility];  Service: Cardiovascular;  Laterality: N/A;   POLYPECTOMY  11/10/2020   Procedure: POLYPECTOMY;  Surgeon: Malissa Hippo, MD;  Location: [facility];  Service: Endoscopy;;   SHOULDER SURGERY Right    TOTAL KNEE ARTHROPLASTY Right 08/25/2014   Procedure: RIGHT TOTAL KNEE ARTHROPLASTY;  Surgeon: Mckinley Jewel, MD;  Location: [facility] OR;  Service: Orthopedics;  Laterality: Right;   Family History  Problem Relation Age of Onset   Heart failure Father    Heart disease Father    Hypertension Mother    Colon polyps Neg Hx    Colon cancer Neg Hx    Esophageal cancer Neg Hx    Rectal cancer Neg Hx    Stomach cancer Neg Hx    Social History   Socioeconomic History   Marital status:  Married    Spouse name: Not on file   Number of children: Not on file   Years of education: Not on file   Highest education level: Not on file  Occupational History   Occupation: Farming    Comment: owned multiple businesses; owns a bar, Retail banker  Tobacco Use   Smoking status: Never   Smokeless tobacco: Never  Vaping Use   Vaping status: Never Used  Substance and Sexual Activity  Alcohol use: Not Currently    Comment: 1-2 times per month   Drug use: No   Sexual activity: Yes    Partners: Female  Other Topics Concern   Not on file  Social History Narrative   Married for 2 years.Lives with wife and step daughter.Originally born in [place].Retired,business entrepeuer-owns several companies.   Social Drivers of Corporate investment banker Strain: Low Risk  (05/06/2023)   Overall Financial Resource Strain (CARDIA)    Difficulty of Paying Living Expenses: Not hard at all  Food Insecurity: No Food Insecurity (05/06/2023)   Hunger Vital Sign    Worried About Running Out of Food in the Last Year: Never true    Ran Out of Food in the Last Year: Never true  Transportation Needs: No Transportation Needs (05/06/2023)   PRAPARE - Administrator, Civil Service (Medical): No    Lack of Transportation (Non-Medical): No  Physical Activity: Sufficiently Active (05/06/2023)   Exercise Vital Sign    Days of Exercise per Week: 7 days    Minutes of Exercise per Session: 30 min  Stress: No Stress Concern Present (05/06/2023)   [employer] of Occupational Health - Occupational Stress Questionnaire    Feeling of Stress : Not at all  Social Connections: Socially Integrated (05/06/2023)   Social Connection and Isolation Panel [NHANES]    Frequency of Communication with Friends and Family: More than three times a week    Frequency of Social Gatherings with Friends and Family: More than three times a week    Attends Religious Services: More than 4 times per year     Active Member of [employer] or Organizations: Yes    Attends Engineer, structural: More than 4 times per year    Marital Status: Married    Tobacco Counseling Counseling given: Yes   Clinical Intake:  Pre-visit preparation completed: Yes  Pain : No/denies pain     BMI - recorded: 32.28 Nutritional Status: BMI > 30  Obese Nutritional Risks: None Diabetes: No  How often do you need to have someone help you when you read instructions, pamphlets, or other written materials from your doctor or pharmacy?: 1 - Never  Interpreter Needed?: No  Information entered by :: Maryjean Ka CMA   Activities of Daily Living    05/06/2023    2:46 PM  In your present state of health, do you have any difficulty performing the following activities:  Hearing? 0  Vision? 0  Difficulty concentrating or making decisions? 0  Walking or climbing stairs? 0  Dressing or bathing? 0  Doing errands, shopping? 0  Preparing Food and eating ? N  Using the Toilet? N  In the past six months, have you accidently leaked urine? N  Do you have problems with loss of bowel control? N  Managing your Medications? N  Managing your Finances? N  Housekeeping or managing your Housekeeping? N    Patient Care Team: Billie Lade, MD as PCP - General (Internal Medicine) Mallipeddi, Orion Modest, MD as PCP - Cardiology (Cardiology) Meriam Sprague, MD (Inactive) as Consulting Physician (Cardiology)  Indicate any recent Medical Services you may have received from other than [facility] providers in the past year (date may be approximate).     Assessment:   This is a routine wellness examination for Merritt.  Hearing/Vision screen Hearing Screening - Comments:: Patient denies any hearing difficulties.   Vision Screening - Comments:: Referral placed today for patient to establish  with an eye doctor.    Goals Addressed             This Visit's Progress    Patient Stated       Work on improving health, lose  weight, eat healthier       Depression Screen    05/06/2023    2:58 PM 11/23/2022    9:47 AM 08/17/2022    9:18 AM 07/27/2022    9:52 AM 04/23/2022    9:08 AM 04/23/2022    8:37 AM 10/25/2021    9:08 AM  PHQ 2/9 Scores  PHQ - 2 Score 0 1 0 1 0 0 0  PHQ- 9 Score 0 4 2 3        Fall Risk    05/06/2023    2:54 PM 11/23/2022    9:47 AM 09/04/2022    2:33 PM 08/17/2022    9:18 AM 07/27/2022    9:52 AM  Fall Risk   Falls in the past year? 0 0 0 0 0  Number falls in past yr: 0 0  0 0  Injury with Fall? 0 0  0 0  Risk for fall due to : No Fall Risks No Fall Risks  No Fall Risks No Fall Risks  Follow up Falls prevention discussed Falls evaluation completed Falls evaluation completed Falls evaluation completed Falls evaluation completed    MEDICARE RISK AT HOME: Medicare Risk at Home Any stairs in or around the home?: Yes If so, are there any without handrails?: No Home free of loose throw rugs in walkways, pet beds, electrical cords, etc?: Yes Adequate lighting in your home to reduce risk of falls?: Yes Life alert?: No Use of a cane, walker or w/c?: No Grab bars in the bathroom?: No Shower chair or bench in shower?: No Elevated toilet seat or a handicapped toilet?: No  TIMED UP AND GO:  Was the test performed?  Yes  Length of time to ambulate 10 feet: 5 sec Gait steady and fast without use of assistive device    Cognitive Function:        05/06/2023    2:57 PM 04/23/2022    9:08 AM 04/04/2021   10:15 AM  6CIT Screen  What Year? 0 points 0 points 0 points  What month? 0 points 0 points 0 points  What time? 0 points 0 points 0 points  Count back from 20 0 points 0 points 0 points  Months in reverse 0 points 0 points 0 points  Repeat phrase 0 points 0 points 0 points  Total Score 0 points 0 points 0 points    Immunizations Immunization History  Administered Date(s) Administered   PFIZER(Purple Top)SARS-COV-2 Vaccination 07/02/2019, 07/16/2019, 07/21/2019, 08/06/2019    PNEUMOCOCCAL CONJUGATE-20 04/04/2021    TDAP status: Due, Education has been provided regarding the importance of this vaccine. Advised may receive this vaccine at local pharmacy or Health Dept. Aware to provide a copy of the vaccination record if obtained from local pharmacy or Health Dept. Verbalized acceptance and understanding.  Flu Vaccine status: Declined, Education has been provided regarding the importance of this vaccine but patient still declined. Advised may receive this vaccine at local pharmacy or Health Dept. Aware to provide a copy of the vaccination record if obtained from local pharmacy or Health Dept. Verbalized acceptance and understanding.  Pneumococcal vaccine status: Up to date  Covid-19 vaccine status: Information provided on how to obtain vaccines.   Qualifies for Shingles Vaccine? Yes   Zostavax  completed No   Shingrix Completed?: No.    Education has been provided regarding the importance of this vaccine. Patient has been advised to call insurance company to determine out of pocket expense if they have not yet received this vaccine. Advised may also receive vaccine at local pharmacy or Health Dept. Verbalized acceptance and understanding.  Screening Tests Health Maintenance  Topic Date Due   DTaP/Tdap/Td (1 - Tdap) Never done   Zoster Vaccines- Shingrix (1 of 2) Never done   INFLUENZA VACCINE  Never done   COVID-19 Vaccine (5 - 2024-25 season) 12/16/2022   Medicare Annual Wellness (AWV)  04/24/2023   Colonoscopy  11/10/2025   Pneumonia Vaccine 60+ Years old  Completed   Hepatitis C Screening  Completed   HPV VACCINES  Aged Out    Health Maintenance  Health Maintenance Due  Topic Date Due   DTaP/Tdap/Td (1 - Tdap) Never done   Zoster Vaccines- Shingrix (1 of 2) Never done   INFLUENZA VACCINE  Never done   COVID-19 Vaccine (5 - 2024-25 season) 12/16/2022   Medicare Annual Wellness (AWV)  04/24/2023    Colorectal cancer screening: Type of screening:  Colonoscopy. Completed 11/10/2020. Repeat every 5 years  Lung Cancer Screening: (Low Dose CT Chest recommended if Age 57-80 years, 20 pack-year currently smoking OR have quit w/in 15years.) does not qualify.   Lung Cancer Screening Referral: na  Additional Screening:  Hepatitis C Screening: does not qualify; Completed   Vision Screening: Recommended annual ophthalmology exams for early detection of glaucoma and other disorders of the eye. Is the patient up to date with their annual eye exam?  No  Who is the provider or what is the name of the office in which the patient attends annual eye exams? Referral placed today If pt is not established with a provider, would they like to be referred to a provider to establish care? Yes .   Dental Screening: Recommended annual dental exams for proper oral hygiene  Diabetic Foot Exam: na  Community Resource Referral / Chronic Care Management: CRR required this visit?  No   CCM required this visit?  No     Plan:     I have personally reviewed and noted the following in the patient's chart:   Medical and social history Use of alcohol, tobacco or illicit drugs  Current medications and supplements including opioid prescriptions. Patient is not currently taking opioid prescriptions. Functional ability and status Nutritional status Physical activity Advanced directives List of other physicians Hospitalizations, surgeries, and ER visits in previous 12 months Vitals Screenings to include cognitive, depression, and falls Referrals and appointments  In addition, I have reviewed and discussed with patient certain preventive protocols, quality metrics, and best practice recommendations. A written personalized care plan for preventive services as well as general preventive health recommendations were provided to patient.     Jordan Hawks Sherolyn Trettin, CMA   05/06/2023   After Visit Summary: (MyChart) Due to this being a telephonic visit, the after visit  summary with patients personalized plan was offered to patient via MyChart   Nurse Notes: see routing comment

## 2023-05-13 DIAGNOSIS — H524 Presbyopia: Secondary | ICD-10-CM | POA: Diagnosis not present

## 2023-05-13 DIAGNOSIS — H2513 Age-related nuclear cataract, bilateral: Secondary | ICD-10-CM | POA: Diagnosis not present

## 2023-05-13 DIAGNOSIS — Z9889 Other specified postprocedural states: Secondary | ICD-10-CM | POA: Diagnosis not present

## 2023-06-06 DIAGNOSIS — K080 Exfoliation of teeth due to systemic causes: Secondary | ICD-10-CM | POA: Diagnosis not present

## 2023-06-18 ENCOUNTER — Encounter (HOSPITAL_BASED_OUTPATIENT_CLINIC_OR_DEPARTMENT_OTHER): Payer: Self-pay | Admitting: Pulmonary Disease

## 2023-06-18 ENCOUNTER — Ambulatory Visit (HOSPITAL_BASED_OUTPATIENT_CLINIC_OR_DEPARTMENT_OTHER): Payer: Medicare Other | Admitting: Pulmonary Disease

## 2023-06-18 VITALS — BP 122/80 | HR 79 | Ht 66.0 in | Wt 178.6 lb

## 2023-06-18 DIAGNOSIS — K219 Gastro-esophageal reflux disease without esophagitis: Secondary | ICD-10-CM

## 2023-06-18 DIAGNOSIS — R351 Nocturia: Secondary | ICD-10-CM | POA: Diagnosis not present

## 2023-06-18 DIAGNOSIS — G4733 Obstructive sleep apnea (adult) (pediatric): Secondary | ICD-10-CM

## 2023-06-18 NOTE — Patient Instructions (Signed)
 X Schedule Home sleep test  X refer to sleep dentist - DR Myrtis Ser or Dr Lestine Box

## 2023-06-18 NOTE — Progress Notes (Signed)
 Subjective:    Patient ID: Scott Roth, male    DOB: [birth year], 68 y.o.   MRN: [id number]  HPI  Mr. Melven Sartorius, a 68 year old with a history of sleep apnea, presents for consultation regarding his ongoing sleep issues. He reports that his symptoms have not worsened, but he has not improved either. He frequently wakes up feeling more tired than usual and has to urinate three to four times a night. He also experiences acid reflux at night and snores loudly, to the point where his wife has to sleep in another room. He is currently on Entresto, baby aspirin, rosuvastatin, and metoprolol for his heart condition, and Flomax for his prostate. He has not seen a urologist for his frequent urination, but he reports that he does not feel like he fully empties his bladder during the day and has to urinate again within an hour. He has tried using a CPAP machine for his sleep apnea, but he finds it uncomfortable and often removes it during the night. He has also tried using a mouth guard, but it does not consistently alleviate his snoring.  PMH :  4v CABG in 2006 ,LAD PCI in 2008  HFrEF  LVEF 40 to 45% since 2019   Epworth sleepiness score is 12. Bedtime is between 10 PM and midnight, sleep latency is minimal, he sleeps on his side with 1 pillow he reports nocturia 3-4 times and incomplete evacuation, he is out of bed anywhere between 7 and 8 AM feeling tired with dryness of mouth no headaches.  He takes a nap for 2 hours every day  He is intolerant of CPAP.  He has tried a mouthguard/boil and bite type which has marginal benefit  Significant tests/ events reviewed  Titration 05/2018 CPAP 9 cm NPSG 05/2018 AHI 75/h, low sat 75% NPSG 11/04/2014 >> AHI of 5.6/h and RDI of 10.1/h.   titration 12/23/2014 was optimal at 9cm H2O with an AHI of 0.0/h.    Past Medical History:  Diagnosis Date   Allergy    seasonal allergies   Arthritis    bilateral hands, knees, shoulders   CAD (coronary artery disease)     a. CABG x4 with LIMA to dLAD, free RIMA to OM1, SVG to D1, SVG to OM2 in 2006. b. PCI 2008 with notable ventricular fibrillation during cath) - DES to prox LAD/1st diagonal. c. Cath 07/25/17 demonstrated previously placed stents, patent LIMA-LAD, patent free RIMA-OM, occluded VG-diagonal and occluded Y graft-OM2, without significant change from prior, elevated LVEDP.   Cardiomyopathy (HCC)    EF prev normal 2004, 2008 // EF 35% by cath in 07/2017 // Echo 8/19:  Mild conc LVH, EF 40-45, diff HK, Gr 1 DD, mild AI, mild MR, mod LAE, normal RVSF, mild RAE, mild TR   Chest pain with moderate risk of acute coronary syndrome 07/24/2017   Chronic systolic CHF (congestive heart failure) (HCC)    Erectile dysfunction    GERD (gastroesophageal reflux disease)    on meds   Hypercholesteremia    Hyperlipidemia    on meds   Hypertension    on meds   Sleep apnea    does not wear CPAP mask   Tubular adenoma    Past Surgical History:  Procedure Laterality Date   CARDIAC CATHETERIZATION     X 1 stent in 2008 due to heart valve collapse after CABG   COLONOSCOPY N/A 11/10/2020   Procedure: COLONOSCOPY;  Surgeon: Malissa Hippo, MD;  Location: [facility];  Service: Endoscopy;  Laterality: N/A;  10:00   CORONARY ARTERY BYPASS GRAFT  2008   x4 vessels   EYE SURGERY Bilateral    Lasik   KNEE ARTHROPLASTY Left    LEFT HEART CATH AND CORS/GRAFTS ANGIOGRAPHY N/A 07/25/2017   Procedure: LEFT HEART CATH AND CORS/GRAFTS ANGIOGRAPHY;  Surgeon: Runell Gess, MD;  Location: [facility];  Service: Cardiovascular;  Laterality: N/A;   POLYPECTOMY  11/10/2020   Procedure: POLYPECTOMY;  Surgeon: Malissa Hippo, MD;  Location: [facility];  Service: Endoscopy;;   SHOULDER SURGERY Right    TOTAL KNEE ARTHROPLASTY Right 08/25/2014   Procedure: RIGHT TOTAL KNEE ARTHROPLASTY;  Surgeon: Mckinley Jewel, MD;  Location: [facility];  Service: Orthopedics;  Laterality: Right;    Allergies  Allergen Reactions    Penicillins Itching and Rash    Other reaction(s): Other (See Comments) Has patient had a PCN reaction causing immediate rash, facial/tongue/throat swelling, SOB or lightheadedness with hypotension: No Has patient had a PCN reaction causing severe rash involving mucus membranes or skin necrosis: No Has patient had a PCN reaction that required hospitalization: No Has patient had a PCN reaction occurring within the last 10 years: No If all of the above answers are "NO", then may proceed with Cephalosporin use.    Social History   Socioeconomic History   Marital status: Married    Spouse name: Not on file   Number of children: Not on file   Years of education: Not on file   Highest education level: Not on file  Occupational History   Occupation: Farming    Comment: owned multiple businesses; owns a bar, Retail banker  Tobacco Use   Smoking status: Never   Smokeless tobacco: Never  Vaping Use   Vaping status: Never Used  Substance and Sexual Activity   Alcohol use: Not Currently    Comment: 1-2 times per month   Drug use: No   Sexual activity: Yes    Partners: Female  Other Topics Concern   Not on file  Social History Narrative   Married for 2 years.Lives with wife and step daughter.Originally born in [place].Retired,business entrepeuer-owns several companies.   Social Drivers of Corporate investment banker Strain: Low Risk  (05/06/2023)   Overall Financial Resource Strain (CARDIA)    Difficulty of Paying Living Expenses: Not hard at all  Food Insecurity: No Food Insecurity (05/06/2023)   Hunger Vital Sign    Worried About Running Out of Food in the Last Year: Never true    Ran Out of Food in the Last Year: Never true  Transportation Needs: No Transportation Needs (05/06/2023)   PRAPARE - Administrator, Civil Service (Medical): No    Lack of Transportation (Non-Medical): No  Physical Activity: Sufficiently Active (05/06/2023)   Exercise Vital  Sign    Days of Exercise per Week: 7 days    Minutes of Exercise per Session: 30 min  Stress: No Stress Concern Present (05/06/2023)   [employer] of Occupational Health - Occupational Stress Questionnaire    Feeling of Stress : Not at all  Social Connections: Socially Integrated (05/06/2023)   Social Connection and Isolation Panel [NHANES]    Frequency of Communication with Friends and Family: More than three times a week    Frequency of Social Gatherings with Friends and Family: More than three times a week    Attends Religious Services: More than 4 times per year    Active  Member of Clubs or Organizations: Yes    Attends Engineer, structural: More than 4 times per year    Marital Status: Married  Catering manager Violence: Not At Risk (05/06/2023)   Humiliation, Afraid, Rape, and Kick questionnaire    Fear of Current or Ex-Partner: No    Emotionally Abused: No    Physically Abused: No    Sexually Abused: No    Family History  Problem Relation Age of Onset   Heart failure Father    Heart disease Father    Hypertension Mother    Colon polyps Neg Hx    Colon cancer Neg Hx    Esophageal cancer Neg Hx    Rectal cancer Neg Hx    Stomach cancer Neg Hx      Review of Systems Constitutional: negative for anorexia, fevers and sweats  Eyes: negative for irritation, redness and visual disturbance  Ears, nose, mouth, throat, and face: negative for earaches, epistaxis, nasal congestion and sore throat  Respiratory: negative for cough, dyspnea on exertion, sputum and wheezing  Cardiovascular: negative for chest pain, dyspnea, lower extremity edema, orthopnea, palpitations and syncope  Gastrointestinal: negative for abdominal pain, constipation, diarrhea, melena, nausea and vomiting  Genitourinary:negative for dysuria, frequency and hematuria  Hematologic/lymphatic: negative for bleeding, easy bruising and lymphadenopathy  Musculoskeletal:negative for arthralgias, muscle  weakness and stiff joints  Neurological: negative for coordination problems, gait problems, headaches and weakness  Endocrine: negative for diabetic symptoms including polydipsia, polyuria and weight loss     Objective:   Physical Exam  Gen. Pleasant, obese, in no distress, normal affect ENT - no pallor,icterus, no post nasal drip, class 2-3 airway Neck: No JVD, no thyromegaly, no carotid bruits Lungs: no use of accessory muscles, no dullness to percussion, decreased without rales or rhonchi  Cardiovascular: Rhythm regular, heart sounds  normal, no murmurs or gallops, no peripheral edema Abdomen: soft and non-tender, no hepatosplenomegaly, BS normal. Musculoskeletal: No deformities, no cyanosis or clubbing Neuro:  alert, non focal, no tremors       Assessment & Plan:   Obstructive Sleep Apnea Chronic obstructive sleep apnea with persistent symptoms intolerant of previous CPAP therapy. Reports discomfort and choking sensation with CPAP. Symptoms include severe snoring, daytime fatigue, and frequent awakenings. Previous sleep studies indicated mild sleep apnea; recent symptoms suggest possible worsening. Discussed alternative treatments including dental mouth guard and implantable device. Explained that the implantable device is surgically installed, similar to a pacemaker, and sends a current to the tongue nerve to prevent airway collapse. This option is considered if apnea events exceed fifteen per hour and impact heart health. - Review previous sleep study from 2020 - Schedule home sleep test to reassess severity - Consider referral to sleep dentist for custom mouth guard - Follow-up in six months to evaluate effectiveness of mouth guard and discuss potential need for implantable device if symptoms persist  Gastroesophageal Reflux Disease (GERD) Intermittent nocturnal acid reflux contributing to sleep disturbances. Symptoms include nighttime acid reflux and snoring exacerbated by late  meals and alcohol consumption. - Advise avoiding meals after 6 PM - Limit alcohol intake, especially late in the evening  Nocturia Frequent nighttime urination, possibly related to prostate issues. Reports incomplete bladder emptying and frequent urination during the day. Currently on Flomax with limited relief. - Recommend urology consultation to evaluate for prostate issues  General Health Maintenance 68 year old male with coronary artery disease, status post-bypass surgery and stent placement. Maintains a healthy lifestyle with regular exercise and weight management. -  Continue current medications: Entresto, baby aspirin, rosuvastatin, metoprolol - Encourage regular physical activity and weight management.

## 2023-07-15 ENCOUNTER — Ambulatory Visit: Admitting: Family Medicine

## 2023-09-25 ENCOUNTER — Other Ambulatory Visit: Payer: Self-pay | Admitting: Internal Medicine

## 2023-09-25 DIAGNOSIS — I5022 Chronic systolic (congestive) heart failure: Secondary | ICD-10-CM

## 2023-09-26 ENCOUNTER — Other Ambulatory Visit: Payer: Self-pay

## 2023-09-26 MED ORDER — ENTRESTO 97-103 MG PO TABS
1.0000 | ORAL_TABLET | Freq: Two times a day (BID) | ORAL | 1 refills | Status: AC
Start: 2023-09-26 — End: ?

## 2023-09-27 ENCOUNTER — Other Ambulatory Visit: Payer: Self-pay | Admitting: Internal Medicine

## 2023-09-27 DIAGNOSIS — I5022 Chronic systolic (congestive) heart failure: Secondary | ICD-10-CM
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
## 2024-01-13 DIAGNOSIS — K080 Exfoliation of teeth due to systemic causes: Secondary | ICD-10-CM | POA: Diagnosis not present

## 2024-01-15 DIAGNOSIS — K080 Exfoliation of teeth due to systemic causes: Secondary | ICD-10-CM | POA: Diagnosis not present

## 2024-02-21 ENCOUNTER — Other Ambulatory Visit: Payer: Self-pay | Admitting: Internal Medicine

## 2024-02-21 DIAGNOSIS — E782 Mixed hyperlipidemia: Secondary | ICD-10-CM

## 2024-03-25 DIAGNOSIS — Z96653 Presence of artificial knee joint, bilateral: Secondary | ICD-10-CM | POA: Diagnosis not present

## 2024-03-25 DIAGNOSIS — Z7689 Persons encountering health services in other specified circumstances: Secondary | ICD-10-CM | POA: Diagnosis not present

## 2024-03-25 DIAGNOSIS — M79641 Pain in right hand: Secondary | ICD-10-CM | POA: Diagnosis not present

## 2024-03-25 DIAGNOSIS — Z130 Encounter for screening for diseases of the blood and blood-forming organs and certain disorders involving the immune mechanism: Secondary | ICD-10-CM | POA: Diagnosis not present

## 2024-03-25 DIAGNOSIS — M19011 Primary osteoarthritis, right shoulder: Secondary | ICD-10-CM | POA: Diagnosis not present

## 2024-03-26 DIAGNOSIS — R7309 Other abnormal glucose: Secondary | ICD-10-CM | POA: Diagnosis not present

## 2024-03-26 DIAGNOSIS — Z130 Encounter for screening for diseases of the blood and blood-forming organs and certain disorders involving the immune mechanism: Secondary | ICD-10-CM | POA: Diagnosis not present

## 2024-03-26 DIAGNOSIS — I10 Essential (primary) hypertension: Secondary | ICD-10-CM | POA: Diagnosis not present

## 2024-03-26 DIAGNOSIS — M79641 Pain in right hand: Secondary | ICD-10-CM | POA: Diagnosis not present

## 2024-03-26 DIAGNOSIS — M25511 Pain in right shoulder: Secondary | ICD-10-CM | POA: Diagnosis not present

## 2024-03-26 DIAGNOSIS — Z13220 Encounter for screening for lipoid disorders: Secondary | ICD-10-CM | POA: Diagnosis not present

## 2024-03-26 DIAGNOSIS — Z125 Encounter for screening for malignant neoplasm of prostate: Secondary | ICD-10-CM | POA: Diagnosis not present

## 2024-03-26 DIAGNOSIS — M542 Cervicalgia: Secondary | ICD-10-CM | POA: Diagnosis not present

## 2024-03-30 ENCOUNTER — Ambulatory Visit: Admitting: Internal Medicine

## 2024-04-03 ENCOUNTER — Ambulatory Visit: Admitting: Internal Medicine

## 2024-04-07 ENCOUNTER — Other Ambulatory Visit: Payer: Self-pay | Admitting: Internal Medicine

## 2024-04-07 DIAGNOSIS — K219 Gastro-esophageal reflux disease without esophagitis: Secondary | ICD-10-CM

## 2024-04-09 NOTE — Progress Notes (Deleted)
 " Cardiology Office Note:    Date:  04/09/2024   ID:  Scott Roth, DOB 05/25/1955, MRN [id number]  PCP:  No primary care provider on file.   [facility] Providers Cardiologist:  Diannah SHAUNNA Maywood, MD { Click to update primary MD,subspecialty MD or APP then REFRESH:1}    Referring MD: No ref. provider found   Chief complaint: Follow-up of CAD     History of Present Illness:   Scott Roth is a 68 y.o. male with a hx of  CAD (CABG x4 with LIMA to dLAD, free RIMA to OM1, SVG to D1, SVG to OM2 in 2006, PCI 2008 with notable ventricular fibrillation during cath), HTN, HLD, OSA not on CPAP, ED, arthritis who presents today for overdue follow-up of chronic cardiac conditions.  Underwent CABG X4 in 2006.  LHC in 2008 resulted in PCI to the first diagonal, VF occurred following intracoronary nitroglycerin  at completion of case, responsive to DCCV.  Surgical clearance for right TKA in April 2019 resulted in a Lexiscan, showing EF 33%, infarction present, no ischemia.  LHC was performed with no clear cause of decreased LV function.  GDMT was started with plans for ICD if no improvement, subsequent echo in 2022 showed EF of 45-50%, borderline aortic dilatation of 37 mm in the ascending aorta was noted.  Patient hesitant to start SGLT2 inhibitor in 2022.  Follow-up in 2023 patient reported DOE and dizziness, Jardiance  was started with plan to add spironolactone  at follow-up.  He was reportedly staying active in 2024, working out 4 days/week without the need for Lasix .  Echo was ordered showing LVEF 40-45%, mildly decreased LV function, global hypokinesis, mild concentric LVH, G1 DD, RV SF moderately reduced, LAE mild-moderately dilated, trivial MV regurg, AV regurg mild-moderate, no stenosis present.  At most recent OV follow-up in November 2024, patient had stopped Jardiance  due to its increased cost at $600 per month.  Did not qualify for assistance program as he makes too much  money.  Was referred to sleep medicine to discuss treatment for OSA, as patient was noncompliant with CPAP.  Follow-up with pulmonology sleep medicine in March 2025, home sleep study was scheduled, not performed.  HFrEF/ICM Echo 01/15/2023: LVEF 40-45%, mildly decreased LV function, global hypokinesis, mild concentric LVH, G1 DD, RVSF moderately reduced Symptoms Weight  Volume Continue lasix  20 mg as needed for swelling/weight gain/fluid retention Continue metoprolol  12.5 mg daily Continue entresto  97-103 BID Continue spironolactone  25 mg daily  CAD LHC 07/25/2017: Patent LIMA->LAD, RIMA->OM1, occluded SVG->OM2, occluded SVG ->D1, diseased LAD w/ patent stent, prox Cfx 100% stenosed. LV function significantly reduced compared to prior   Continue aspirin  81 mg daily Continue entresto  as above Continue rosuvastatin  10 mg daily Continue nitroglycerin  0.4 mg SL tab PRN chest pain every 5 minutes Continue metoprolol  as above  HLD Lipid panel 11/2022: chol 160, hdl 35, ldl 94, trig 181 Continue rosuvastatin  10 mg daily  HTN BPs   OSA Does not tolerate CPAP   ROS:   Please see the history of present illness.    *** All other systems reviewed and are negative.     Past Medical History:  Diagnosis Date   Allergy    seasonal allergies   Arthritis    bilateral hands, knees, shoulders   CAD (coronary artery disease)    a. CABG x4 with LIMA to dLAD, free RIMA to OM1, SVG to D1, SVG to OM2 in 2006. b. PCI 2008 with notable ventricular fibrillation during  cath) - DES to prox LAD/1st diagonal. c. Cath 07/25/17 demonstrated previously placed stents, patent LIMA-LAD, patent free RIMA-OM, occluded VG-diagonal and occluded Y graft-OM2, without significant change from prior, elevated LVEDP.   Cardiomyopathy (HCC)    EF prev normal 2004, 2008 // EF 35% by cath in 07/2017 // Echo 8/19:  Mild conc LVH, EF 40-45, diff HK, Gr 1 DD, mild AI, mild MR, mod LAE, normal RVSF, mild RAE, mild TR   Chest  pain with moderate risk of acute coronary syndrome 07/24/2017   Chronic systolic CHF (congestive heart failure) (HCC)    Erectile dysfunction    GERD (gastroesophageal reflux disease)    on meds   Hypercholesteremia    Hyperlipidemia    on meds   Hypertension    on meds   Sleep apnea    does not wear CPAP mask   Tubular adenoma     Past Surgical History:  Procedure Laterality Date   CARDIAC CATHETERIZATION     X 1 stent in 2008 due to heart valve collapse after CABG   COLONOSCOPY N/A 11/10/2020   Procedure: COLONOSCOPY;  Surgeon: Golda Claudis PENNER, MD;  [facility];  Service: Endoscopy;  Laterality: N/A;  10:00   CORONARY ARTERY BYPASS GRAFT  2008   x4 vessels   EYE SURGERY Bilateral    Lasik   KNEE ARTHROPLASTY Left    LEFT HEART CATH AND CORS/GRAFTS ANGIOGRAPHY N/A 07/25/2017   Procedure: LEFT HEART CATH AND CORS/GRAFTS ANGIOGRAPHY;  Surgeon: Court Dorn PARAS, MD;  [facility];  Service: Cardiovascular;  Laterality: N/A;   POLYPECTOMY  11/10/2020   Procedure: POLYPECTOMY;  Surgeon: Golda Claudis PENNER, MD;  [facility];  Service: Endoscopy;;   SHOULDER SURGERY Right    TOTAL KNEE ARTHROPLASTY Right 08/25/2014   Procedure: RIGHT TOTAL KNEE ARTHROPLASTY;  Surgeon: Toribio Chancy, MD;  Location: [facility];  Service: Orthopedics;  Laterality: Right;    Current Medications: Active Medications[1]   Allergies:   Penicillins   Social History   Socioeconomic History   Marital status: Married    Spouse name: Not on file   Number of children: Not on file   Years of education: Not on file   Highest education level: Not on file  Occupational History   Occupation: Farming    Comment: owned multiple businesses; owns a bar, retail banker  Tobacco Use   Smoking status: Never   Smokeless tobacco: Never  Vaping Use   Vaping status: Never Used  Substance and Sexual Activity   Alcohol use: Not Currently    Comment: 1-2 times per month    Drug use: No   Sexual activity: Yes    Partners: Female  Other Topics Concern   Not on file  Social History Narrative   Married for 2 years.Lives with wife and step daughter.Originally born in [place].Retired,business entrepeuer-owns several companies.   Social Drivers of Health   Tobacco Use: Low Risk (06/18/2023)   Patient History    Smoking Tobacco Use: Never    Smokeless Tobacco Use: Never    Passive Exposure: Not on file  Financial Resource Strain: Low Risk (05/06/2023)   Overall Financial Resource Strain (CARDIA)    Difficulty of Paying Living Expenses: Not hard at all  Food Insecurity: No Food Insecurity (05/06/2023)   Hunger Vital Sign    Worried About Running Out of Food in the Last Year: Never true    Ran Out of Food in the Last  Year: Never true  Transportation Needs: No Transportation Needs (05/06/2023)   PRAPARE - Administrator, Civil Service (Medical): No    Lack of Transportation (Non-Medical): No  Physical Activity: Sufficiently Active (05/06/2023)   Exercise Vital Sign    Days of Exercise per Week: 7 days    Minutes of Exercise per Session: 30 min  Stress: No Stress Concern Present (05/06/2023)   [employer] of Occupational Health - Occupational Stress Questionnaire    Feeling of Stress : Not at all  Social Connections: Socially Integrated (05/06/2023)   Social Connection and Isolation Panel    Frequency of Communication with Friends and Family: More than three times a week    Frequency of Social Gatherings with Friends and Family: More than three times a week    Attends Religious Services: More than 4 times per year    Active Member of Clubs or Organizations: Yes    Attends Banker Meetings: More than 4 times per year    Marital Status: Married  Depression (PHQ2-9): Low Risk (05/06/2023)   Depression (PHQ2-9)    PHQ-2 Score: 0  Alcohol Screen: Low Risk (05/06/2023)   Alcohol Screen    Last Alcohol Screening Score (AUDIT): 0   Housing: Low Risk (05/06/2023)   Housing Stability Vital Sign    Unable to Pay for Housing in the Last Year: No    Number of Times Moved in the Last Year: 0    Homeless in the Last Year: No  Utilities: Not At Risk (05/06/2023)   AHC Utilities    Threatened with loss of utilities: No  Health Literacy: Adequate Health Literacy (05/06/2023)   B1300 Health Literacy    Frequency of need for help with medical instructions: Never     Family History: The patient's ***family history includes Heart disease in his father; Heart failure in his father; Hypertension in his mother. There is no history of Colon polyps, Colon cancer, Esophageal cancer, Rectal cancer, or Stomach cancer.  EKGs/Labs/Other Studies Reviewed:    The following studies were reviewed today: ***      Recent Labs: No results found for requested labs within last 365 days.  Recent Lipid Panel    Component Value Date/Time   CHOL 160 11/23/2022 0912   TRIG 181 (H) 11/23/2022 0912   HDL 35 (L) 11/23/2022 0912   CHOLHDL 4.6 11/23/2022 0912   CHOLHDL 4.9 06/30/2019 1345   VLDL 21 07/24/2017 1924   LDLCALC 94 11/23/2022 0912   LDLCALC 110 (H) 06/30/2019 1345     Risk Assessment/Calculations:   {Does this patient have ATRIAL FIBRILLATION?:[phone]}  No BP recorded.  {Refresh Note OR Click here to enter BP  :1}***         Physical Exam:    VS:  There were no vitals taken for this visit.       Wt Readings from Last 3 Encounters:  06/18/23 178 lb 9.6 oz (81 kg)  05/06/23 200 lb (90.7 kg)  02/27/23 194 lb 6.4 oz (88.2 kg)     GEN: *** Well nourished, well developed in no acute distress HEENT: Normal NECK: No JVD; No carotid bruits CARDIAC: *** S1-S2 normal, RRR, no murmurs, rubs, gallops RESPIRATORY:  Clear to auscultation without rales, wheezing or rhonchi  MUSCULOSKELETAL:  No edema; No deformity  SKIN: Warm and dry NEUROLOGIC:  Alert and oriented x 3 PSYCHIATRIC:  Normal affect       Assessment &  Plan Ischemic cardiomyopathy  Sleep apnea,  unspecified type  Coronary artery disease involving native coronary artery of native heart without angina pectoris  Mixed hyperlipidemia  HFrEF (heart failure with reduced ejection fraction) (HCC)   Disposition: *** Route to primary cardiologist      {Are you ordering a CV Procedure (e.g. stress test, cath, DCCV, TEE, etc)?   Press F2        :[id number]}    Medication Adjustments/Labs and Tests Ordered: Current medicines are reviewed at length with the patient today.  Concerns regarding medicines are outlined above.  No orders of the defined types were placed in this encounter.  No orders of the defined types were placed in this encounter.   There are no Patient Instructions on file for this visit.   Signed, Danielle Lento E Lissa Rowles, NP  04/09/2024 7:49 PM    [facility]     [1]  No outpatient medications have been marked as taking for the 04/10/24 encounter (Appointment) with Abigail Bernardino HERO, PA-C.   Current Facility-Administered Medications for the 04/10/24 encounter (Appointment) with Abigail Bernardino HERO, PA-C  Medication   betamethasone  acetate-betamethasone  sodium phosphate  (CELESTONE ) injection 9 mg   "

## 2024-04-10 ENCOUNTER — Ambulatory Visit: Attending: Physician Assistant | Admitting: Physician Assistant

## 2024-05-06 ENCOUNTER — Ambulatory Visit: Payer: Medicare Other

## 2024-05-13 NOTE — Progress Notes (Unsigned)
 "  Referring Physician:  Landy Margaretann Kay, FNP [address] [place],  [place] [address]  Primary Physician:  No primary care provider on file.  History of Present Illness: 05/13/2024 [name] is here today with a chief complaint of ***  Neck pain Numbness and tingling in neck and arms Hand weakness  Duration: *** Location: *** Quality: *** Severity: ***  Precipitating: aggravated by *** Modifying factors: made better by *** Weakness: none Timing: *** Bowel/Bladder Dysfunction: none  Conservative measures:  Physical therapy: *** Has not participated? Multimodal medical therapy including regular antiinflammatories: *** ? Injections: *** epidural steroid injections?  Past Surgery: ***  Scott Roth has ***no symptoms of cervical myelopathy.  The symptoms are causing a significant impact on the patient's life.   Review of Systems:  A 10 point review of systems is negative, except for the pertinent positives and negatives detailed in the HPI.  Past Medical History: Past Medical History:  Diagnosis Date   Allergy    seasonal allergies   Arthritis    bilateral hands, knees, shoulders   CAD (coronary artery disease)    a. CABG x4 with LIMA to dLAD, free RIMA to OM1, SVG to D1, SVG to OM2 in 2006. b. PCI 2008 with notable ventricular fibrillation during cath) - DES to prox LAD/1st diagonal. c. Cath 07/25/17 demonstrated previously placed stents, patent LIMA-LAD, patent free RIMA-OM, occluded VG-diagonal and occluded Y graft-OM2, without significant change from prior, elevated LVEDP.   Cardiomyopathy (HCC)    EF prev normal 2004, 2008 // EF 35% by cath in 07/2017 // Echo 8/19:  Mild conc LVH, EF 40-45, diff HK, Gr 1 DD, mild AI, mild MR, mod LAE, normal RVSF, mild RAE, mild TR   Chest pain with moderate risk of acute coronary syndrome 07/24/2017   Chronic systolic CHF (congestive heart failure) (HCC)    Erectile dysfunction    GERD (gastroesophageal  reflux disease)    on meds   Hypercholesteremia    Hyperlipidemia    on meds   Hypertension    on meds   Sleep apnea    does not wear CPAP mask   Tubular adenoma     Past Surgical History: Past Surgical History:  Procedure Laterality Date   CARDIAC CATHETERIZATION     X 1 stent in 2008 due to heart valve collapse after CABG   COLONOSCOPY N/A 11/10/2020   Procedure: COLONOSCOPY;  Surgeon: Scott Claudis PENNER, MD;  [facility];  Service: Endoscopy;  Laterality: N/A;  10:00   CORONARY ARTERY BYPASS GRAFT  2008   x4 vessels   EYE SURGERY Bilateral    Lasik   KNEE ARTHROPLASTY Left    LEFT HEART CATH AND CORS/GRAFTS ANGIOGRAPHY N/A 07/25/2017   Procedure: LEFT HEART CATH AND CORS/GRAFTS ANGIOGRAPHY;  Surgeon: Scott Dorn PARAS, MD;  [facility];  Service: Cardiovascular;  Laterality: N/A;   POLYPECTOMY  11/10/2020   Procedure: POLYPECTOMY;  Surgeon: Scott Claudis PENNER, MD;  [facility];  Service: Endoscopy;;   SHOULDER SURGERY Right    TOTAL KNEE ARTHROPLASTY Right 08/25/2014   Procedure: RIGHT TOTAL KNEE ARTHROPLASTY;  Surgeon: Scott Chancy, MD;  Location: [facility];  Service: Orthopedics;  Laterality: Right;    Allergies: Allergies as of 05/18/2024 - Review Complete 06/18/2023  Allergen Reaction Noted   Penicillins Itching and Rash 08/22/2013    Medications: Outpatient Encounter Medications as of 05/18/2024  Medication Sig   aspirin  EC 81 MG  tablet Take 1 tablet (81 mg total) by mouth daily. Swallow whole.   B Complex-C (SUPER B COMPLEX PO) Take 1 capsule by mouth daily.   Cholecalciferol (VITAMIN D3) 125 MCG (5000 UT) CAPS Take 10,000 Units by mouth daily.    Coenzyme Q10 (COQ10) 100 MG CAPS Take 100 mg by mouth daily.   Cyanocobalamin  (B-12 PO) Take 1 tablet by mouth daily.    furosemide  (LASIX ) 20 MG tablet Take 20 mg by mouth as needed for fluid.   GARLIC PO Take 1 tablet by mouth daily.   magnesium chloride (SLOW-MAG) 64 MG TBEC SR tablet  Take 1 tablet by mouth daily.   meclizine  (ANTIVERT ) 25 MG tablet Take 1 tablet (25 mg total) by mouth 3 (three) times daily as needed for dizziness.   metoprolol  succinate (TOPROL -XL) 25 MG 24 hr tablet TAKE 1/2 TABLET(12.5 MG) BY MOUTH DAILY   Multiple Vitamins-Minerals (MULTIVITAMIN PO) Take 1 tablet by mouth daily.    nitroGLYCERIN  (NITROSTAT ) 0.4 MG SL tablet PLACE 1 TABLET UNDER THE TONGUE EVERY 5 (FIVE) MINUTES X 3 DOSES AS NEEDED FOR CHEST PAIN.   pantoprazole  (PROTONIX ) 40 MG tablet Take 1 tablet (40 mg total) by mouth daily.   rosuvastatin  (CRESTOR ) 10 MG tablet TAKE 1 TABLET(10 MG) BY MOUTH DAILY   sacubitril -valsartan  (ENTRESTO ) 97-103 MG Take 1 tablet by mouth 2 (two) times daily.   spironolactone  (ALDACTONE ) 25 MG tablet Take 1 tablet (25 mg total) by mouth daily.   tamsulosin  (FLOMAX ) 0.4 MG CAPS capsule Take 1 capsule (0.4 mg total) by mouth daily.   TURMERIC PO Take 500 mg by mouth daily.    vitamin C (ASCORBIC ACID) 500 MG tablet Take 500 mg by mouth daily.   VITAMIN E PO Take 400 mg by mouth daily.    Facility-Administered Encounter Medications as of 05/18/2024  Medication   betamethasone  acetate-betamethasone  sodium phosphate  (CELESTONE ) injection 9 mg    Social History: Social History[1]  Family Medical History: Family History  Problem Relation Age of Onset   Heart failure Father    Heart disease Father    Hypertension Mother    Colon polyps Neg Hx    Colon cancer Neg Hx    Esophageal cancer Neg Hx    Rectal cancer Neg Hx    Stomach cancer Neg Hx     Physical Examination: @VITALWITHPAIN @  General: Patient is well developed, well nourished, calm, collected, and in no apparent distress. Attention to examination is appropriate.  Psychiatric: Patient is non-anxious.  Head:  Pupils equal, round, and reactive to light.  ENT:  Oral mucosa appears well hydrated.  Neck:   Supple.  ***Full range of motion.  Respiratory: Patient is breathing without any  difficulty.  Extremities: No edema.  Vascular: Palpable dorsal pedal pulses.  Skin:   On exposed skin, there are no abnormal skin lesions.  NEUROLOGICAL:     Awake, alert, oriented to person, place, and time.  Speech is clear and fluent. Fund of knowledge is appropriate.   Cranial Nerves: Pupils equal round and reactive to light.  Facial tone is symmetric.  Facial sensation is symmetric.  ROM of spine: ***full.  Palpation of spine: ***non tender.    Strength: Side Biceps Triceps Deltoid Interossei Grip Wrist Ext. Wrist Flex.  R 5 5 5 5 5 5 5   L 5 5 5 5 5 5 5    Side Iliopsoas Quads Hamstring PF DF EHL  R 5 5 5 5 5 5   L 5 5 5  5 5 5    Reflexes are ***2+ and symmetric at the biceps, triceps, brachioradialis, patella and achilles.   Hoffman's is absent.  Clonus is not present.  Toes are down-going.  Bilateral upper and lower extremity sensation is intact to light touch.    Gait is normal.   No difficulty with tandem gait.   No evidence of dysmetria noted.  Medical Decision Making  Imaging: ***  I have personally reviewed the images and agree with the above interpretation.  Assessment and Plan: [name] is a pleasant 69 y.o. male with ***    Thank you for involving me in the care of this patient.   I spent a total of *** minutes in both face-to-face and non-face-to-face activities for this visit on the date of this encounter.   Scott Decamp, PA-C Dept. of Neurosurgery     [1]  Social History Tobacco Use   Smoking status: Never   Smokeless tobacco: Never  Vaping Use   Vaping status: Never Used  Substance Use Topics   Alcohol use: Not Currently    Comment: 1-2 times per month   Drug use: No   "

## 2024-05-18 ENCOUNTER — Ambulatory Visit: Admitting: Physician Assistant

## 2024-05-20 ENCOUNTER — Ambulatory Visit

## 2024-05-20 ENCOUNTER — Ambulatory Visit: Admitting: Physician Assistant

## 2024-05-20 VITALS — BP 117/80 | Wt 181.0 lb

## 2024-05-20 DIAGNOSIS — M542 Cervicalgia: Secondary | ICD-10-CM

## 2024-05-20 DIAGNOSIS — M545 Low back pain, unspecified: Secondary | ICD-10-CM

## 2024-05-20 DIAGNOSIS — R202 Paresthesia of skin: Secondary | ICD-10-CM | POA: Diagnosis not present

## 2024-05-20 DIAGNOSIS — R350 Frequency of micturition: Secondary | ICD-10-CM

## 2024-05-20 DIAGNOSIS — G8929 Other chronic pain: Secondary | ICD-10-CM

## 2024-05-20 DIAGNOSIS — M5412 Radiculopathy, cervical region: Secondary | ICD-10-CM

## 2024-05-20 DIAGNOSIS — R200 Anesthesia of skin: Secondary | ICD-10-CM | POA: Diagnosis not present

## 2024-05-20 DIAGNOSIS — M549 Dorsalgia, unspecified: Secondary | ICD-10-CM

## 2024-05-20 MED ORDER — TIZANIDINE HCL 4 MG PO TABS: 4.0000 mg | ORAL_TABLET | Freq: Three times a day (TID) | ORAL | 1 refills | Status: AC | PRN

## 2024-05-23 ENCOUNTER — Other Ambulatory Visit

## 2024-05-23 ENCOUNTER — Inpatient Hospital Stay: Admission: RE | Admit: 2024-05-23

## 2024-07-15 ENCOUNTER — Ambulatory Visit: Admitting: Physician Assistant
